# Patient Record
Sex: Male | Born: 1970 | Race: White | Hispanic: Yes | Marital: Married | Smoking: Never smoker
Health system: Southern US, Community
[De-identification: ages and names within clinical notes are randomized; demographics above are authoritative.]

## PROBLEM LIST (undated history)

## (undated) DIAGNOSIS — E119 Type 2 diabetes mellitus without complications: Secondary | ICD-10-CM

## (undated) DIAGNOSIS — I10 Essential (primary) hypertension: Secondary | ICD-10-CM

## (undated) HISTORY — PX: APPENDECTOMY: SHX54

---

## 2008-01-25 ENCOUNTER — Ambulatory Visit: Payer: Self-pay | Admitting: Nurse Practitioner

## 2008-01-25 DIAGNOSIS — K5900 Constipation, unspecified: Secondary | ICD-10-CM | POA: Insufficient documentation

## 2008-01-25 DIAGNOSIS — K219 Gastro-esophageal reflux disease without esophagitis: Secondary | ICD-10-CM | POA: Insufficient documentation

## 2008-01-29 ENCOUNTER — Ambulatory Visit: Payer: Self-pay | Admitting: *Deleted

## 2008-03-07 ENCOUNTER — Ambulatory Visit: Payer: Self-pay | Admitting: Nurse Practitioner

## 2008-03-07 DIAGNOSIS — E119 Type 2 diabetes mellitus without complications: Secondary | ICD-10-CM | POA: Insufficient documentation

## 2008-03-07 DIAGNOSIS — R81 Glycosuria: Secondary | ICD-10-CM | POA: Insufficient documentation

## 2008-03-07 DIAGNOSIS — E1165 Type 2 diabetes mellitus with hyperglycemia: Secondary | ICD-10-CM | POA: Insufficient documentation

## 2008-03-07 DIAGNOSIS — R3129 Other microscopic hematuria: Secondary | ICD-10-CM

## 2008-03-08 ENCOUNTER — Encounter (INDEPENDENT_AMBULATORY_CARE_PROVIDER_SITE_OTHER): Payer: Self-pay | Admitting: Nurse Practitioner

## 2008-03-09 ENCOUNTER — Encounter (INDEPENDENT_AMBULATORY_CARE_PROVIDER_SITE_OTHER): Payer: Self-pay | Admitting: Nurse Practitioner

## 2008-03-09 LAB — CONVERTED CEMR LAB
HDL: 41 mg/dL
LDL Cholesterol: 117 mg/dL
Total CHOL/HDL Ratio: 5
Triglycerides: 238 mg/dL
VLDL: 48 mg/dL

## 2008-03-11 ENCOUNTER — Encounter (INDEPENDENT_AMBULATORY_CARE_PROVIDER_SITE_OTHER): Payer: Self-pay | Admitting: Nurse Practitioner

## 2008-03-12 ENCOUNTER — Ambulatory Visit: Payer: Self-pay | Admitting: Nurse Practitioner

## 2008-07-05 ENCOUNTER — Ambulatory Visit: Payer: Self-pay | Admitting: Nurse Practitioner

## 2008-07-05 LAB — CONVERTED CEMR LAB
Bilirubin Urine: NEGATIVE
Ketones, urine, test strip: NEGATIVE
Microalb, Ur: 0.83 mg/dL (ref 0.00–1.89)
Nitrite: NEGATIVE
Urobilinogen, UA: 0.2

## 2008-07-11 ENCOUNTER — Ambulatory Visit: Payer: Self-pay | Admitting: Internal Medicine

## 2008-08-22 ENCOUNTER — Ambulatory Visit: Payer: Self-pay | Admitting: Nurse Practitioner

## 2008-08-22 DIAGNOSIS — B354 Tinea corporis: Secondary | ICD-10-CM | POA: Insufficient documentation

## 2008-08-22 LAB — CONVERTED CEMR LAB: Blood Glucose, Fingerstick: 152

## 2009-03-24 ENCOUNTER — Encounter: Admission: RE | Admit: 2009-03-24 | Discharge: 2009-03-24 | Payer: Self-pay | Admitting: Geriatric Medicine

## 2010-06-14 LAB — CONVERTED CEMR LAB
ALT: 69 units/L — ABNORMAL HIGH (ref 0–53)
AST: 29 units/L (ref 0–37)
Albumin: 4.5 g/dL (ref 3.5–5.2)
Alkaline Phosphatase: 83 units/L (ref 39–117)
BUN: 11 mg/dL (ref 6–23)
Basophils Absolute: 0.1 10*3/uL (ref 0.0–0.1)
Basophils Relative: 1 % (ref 0–1)
Blood Glucose, Fingerstick: 213
Chlamydia, Swab/Urine, PCR: NEGATIVE
Chloride: 103 meq/L (ref 96–112)
Eosinophils Absolute: 0.1 10*3/uL (ref 0.0–0.7)
Glucose, Urine, Semiquant: 250
HDL: 41 mg/dL (ref >39)
LDL Cholesterol: 117 mg/dL — ABNORMAL HIGH (ref 0–99)
MCHC: 32.3 g/dL (ref 30.0–36.0)
MCV: 87.5 fL (ref 78.0–100.0)
Monocytes Relative: 6 % (ref 3–12)
Neutro Abs: 4.3 10*3/uL (ref 1.7–7.7)
Neutrophils Relative %: 55 % (ref 43–77)
Nitrite: NEGATIVE
Platelets: 248 10*3/uL (ref 150–400)
Potassium: 4.4 meq/L (ref 3.5–5.3)
RBC: 6.16 M/uL — ABNORMAL HIGH (ref 4.22–5.81)
RDW: 14.2 % (ref 11.5–15.5)
TSH: 4.462 microintl units/mL (ref 0.350–4.50)
Total CHOL/HDL Ratio: 5
WBC Urine, dipstick: NEGATIVE

## 2015-05-13 ENCOUNTER — Encounter (HOSPITAL_COMMUNITY): Payer: Self-pay | Admitting: Emergency Medicine

## 2015-05-13 ENCOUNTER — Emergency Department (INDEPENDENT_AMBULATORY_CARE_PROVIDER_SITE_OTHER)
Admission: EM | Admit: 2015-05-13 | Discharge: 2015-05-13 | Disposition: A | Discharge status: Home / Self Care | Payer: BLUE CROSS/BLUE SHIELD | Source: Home / Self Care | Attending: Emergency Medicine | Admitting: Emergency Medicine

## 2015-05-13 DIAGNOSIS — S86811A Strain of other muscle(s) and tendon(s) at lower leg level, right leg, initial encounter: Secondary | ICD-10-CM

## 2015-05-13 DIAGNOSIS — M76891 Other specified enthesopathies of right lower limb, excluding foot: Secondary | ICD-10-CM

## 2015-05-13 DIAGNOSIS — S86911A Strain of unspecified muscle(s) and tendon(s) at lower leg level, right leg, initial encounter: Secondary | ICD-10-CM

## 2015-05-13 DIAGNOSIS — M6580 Other synovitis and tenosynovitis, unspecified site: Secondary | ICD-10-CM

## 2015-05-13 DIAGNOSIS — K6289 Other specified diseases of anus and rectum: Secondary | ICD-10-CM | POA: Diagnosis not present

## 2015-05-13 MED ORDER — DICLOFENAC POTASSIUM 50 MG PO TABS: 50.0000 mg | ORAL_TABLET | Freq: Three times a day (TID) | ORAL | Status: DC

## 2015-05-13 NOTE — ED Provider Notes (Signed)
CSN: [id number]     Arrival date & time 05/13/15  1320 History   First MD Initiated Contact with Patient 05/13/15 1428     Chief Complaint  Patient presents with  . Knee Pain  . Rectal Pain   (Consider location/radiation/quality/duration/timing/severity/associated sxs/prior Treatment) HPI Comments: 44 year old male presents the urgent care with 2 chronic complaints. First complaint is that of anal pain for a year. He believes he has hemorrhoids. It tends to, and go. He may be without discomfort for several weeks and then for a period time he will have discomfort. Primarily occurs when lifting. He states his job requires much walking, squatting and lifting. It is primarily at the time of lifting that he experiences. Anal pain.  The second complaint is pain in the right knee for 2 months. It is mostly constant but can occur acutely. It occurs mostly at work for the similar reasons as above, lifting, frequent ambulation, changing directions quickly and other physical maneuvers. The pain is across the anterior knee vertically along the medial and lateral aspects as well as the proximal calf tendon. It is also worse with prolonged standing. He denies any known single event injury. No known trauma. Rest seems to make it better. Physical activities at work seem to make it worse. He also feels a popping in the knee with flexion and extension.   History reviewed. No pertinent past medical history. History reviewed. No pertinent past surgical history. No family history on file. Social History  Substance Use Topics  . Smoking status: Never Smoker   . Smokeless tobacco: None  . Alcohol Use: Yes    Review of Systems  Constitutional: Positive for activity change. Negative for fever and fatigue.  HENT: Negative.   Respiratory: Negative.   Cardiovascular: Negative for chest pain and leg swelling.  Gastrointestinal: Negative.   Genitourinary: Negative.   Musculoskeletal: Positive for arthralgias.  Negative for back pain, joint swelling, neck pain and neck stiffness.  Skin: Negative.   Neurological: Negative.   Psychiatric/Behavioral: Negative.   All other systems reviewed and are negative.   Allergies  Review of patient's allergies indicates no known allergies.  Home Medications   Prior to Admission medications   Medication Sig Start Date End Date Taking? Authorizing Provider  diclofenac (CATAFLAM) 50 MG tablet Take 1 tablet (50 mg total) by mouth 3 (three) times daily. One tablet TID with food prn pain. 05/13/15   Hayden Rasmussenavid Shamari Lofquist, NP   Meds Ordered and Administered this Visit  Medications - No data to display  BP 124/80 mmHg  Pulse 79  Temp(Src) 98.1 F (36.7 C) (Oral)  Resp 16  SpO2 97% No data found.   Physical Exam  Constitutional: He is oriented to person, place, and time. He appears well-developed and well-nourished. No distress.  Eyes: EOM are normal.  Neck: Normal range of motion. Neck supple.  Cardiovascular: Normal rate.   Pulmonary/Chest: Effort normal. No respiratory distress.  Genitourinary:  Inspection of the anus and. He anal area reveal no lesions, redness, swelling or tenderness. DRE reveals normal sphincter tone, no palpable lesions. And no tenderness. Patient denies pain with DRE. There are no surrounding areas resembling infection or abscess formation.   Musculoskeletal: He exhibits no edema.  Specimen of the right knee reveals no swelling or deformity. With extension and flexion there is a popping sound. Extension to 180. Flexion to 110. There is tenderness along the anterior knee, patellofemoral ligament and patellar tibial ligament. There is tenderness across the medial and lateral  joint space vertically. No direct tenderness to the joint spaces. No direct tenderness along the joint line. There is tenderness along the medial aspect of the knee as well as the proximal calcaneus tendon. No laxity appreciated. Negative drawer. Negative varus, negative  valgus. Internal and external rotation reveals no pain. Distal neurovascular motor sensory is intact.   Neurological: He is alert and oriented to person, place, and time. He exhibits normal muscle tone.  Skin: Skin is warm and dry.  Psychiatric: He has a normal mood and affect.  Nursing note and vitals reviewed.   ED Course  Procedures (including critical care time)  Labs Review Labs Reviewed - No data to display  Imaging Review No results found.   Visual Acuity Review  Right Eye Distance:   Left Eye Distance:   Bilateral Distance:    Right Eye Near:   Left Eye Near:    Bilateral Near:         MDM   1. Perianal pain   2. Knee strain, right, initial encounter   3. Tendinitis of right knee    Knee sleeve to the right knee. Use ice before and after work. Limit activity that exacerbates the pain. Cataflam for pain and inflammation Call and obtain a PCP. May need referral for orthopedics in the future. Rectal exam unremarkable. Follow-up with PCP. No straining.   Hayden Rasmussen, NP 05/13/15 1524

## 2015-05-13 NOTE — ED Notes (Signed)
Patient reports right knee pain, no known injury.  Notices pain with a lot of standing.  Pain for 2 months. Patient reports rectal issues, thinks it is hemorrhoids, says he is not hurting right now.  Patient smells of stool

## 2015-05-13 NOTE — Discharge Instructions (Signed)
La[place] People's Democratic RepublicVenda elstica y RHCE Investment banker, operational(Elastic Bandage and RICE) QU FUNCIN CUMPLE LA VENDA ELSTICA? Las vendas elsticas vienen de diferentes formas y Veterinary surgeontamaos. Generalmente, sirven para sujetar la lesin y reducen la hinchazn mientras usted se recupera, pero pueden cumplir diferentes funciones. El mdico lo ayudar a decidir lo que es ms adecuado para su proteccin, recuperacin o rehabilitacin, despus de una lesin. CULES SON ALGUNOS CONSEJOS GENERALES PARA EL USO DE UNA VENDA ELSTICA?  Pngase la venda como lo indica el fabricante de la venda que est [place]usando.  No la ajuste demasiado, ya que esto puede interrumpir la circulacin en la zona del brazo o de la pierna por debajo de la venda.  Si una parte del cuerpo ms all de la venda se torna color azul, se adormece, se enfra, se hincha o le causa ms dolor, es probable que la venda est [place] Islandsmuy ajustada. Si eso ocurre, retire la venda y vuelva a colocarla ms floja.  Consulte al mdico si la venda parece estar agravando los problemas en lugar de mejorndolos.  La venda elstica debe quitarse y volver a ponerse cada 3 o 4horas, o como se lo haya indicado el mdico. QU SIGNIFICA LA SIGLA RHCE? Los cuidados de rutina de muchas lesiones incluyen reposo, hielo, compresin y elevacin (RHCE).  Reposo El reposo es necesario para permitir que el cuerpo se recupere. Generalmente, puede reanudar sus actividades habituales cuando se siente cmodo y el mdico se lo ha autorizado. Hielo Aplicar hielo en una lesin sirve para evitar la hinchazn y Engineer, materialsdisminuye el dolor. No aplique el hielo directamente sobre la piel.  Ponga el hielo en una bolsa plstica.  Coloque una toalla entre la piel y la bolsa de hielo.  Coloque el hielo durante 20minutos, 2 a 3veces por Futures traderda. Hgalo durante el tiempo que el mdico se lo haya indicado. Compresin La compresin ayuda a evitar la hinchazn, brinda sujecin y [place] Grenadinesayuda con las [address] Avemolestias. Una venda elstica sirve para la  compresin. Elevacin La elevacin ayuda a reducir la hinchazn y Engineer, materialsdisminuye el dolor. Si es posible, la zona lesionada debe estar a nivel del corazn o del centro del pecho, o por encima de [place]e. CUNDO DEBO BUSCAR ATENCIN MDICA? Debe buscar atencin mdica en las siguientes situaciones:  El dolor o la hinchazn persisten.  Los sntomas empeoran en vez de Scientist, clinical (histocompatibility and immunogenetics)mejorar. Estos sntomas pueden indicar que es necesaria una evaluacin ms profunda o nuevas radiografas. En algunos casos, las radiografas no muestran si hay un hueso pequeo roto (fractura) hasta varios das ms tarde. Concurra a las citas de control con el mdico. Consulte con su mdico la fecha en que los [place]resultados de las radiografas estarn disponibles. Asegrese de Starbucks Corporationobtener los resultados de las radiografas. CUNDO DEBO BUSCAR ASISTENCIA MDICA INMEDIATA? Solicite atencin mdica inmediatamente en las siguientes situaciones:  Comienza a Clinical cytogeneticistsentir sbitamente un dolor intenso en la zona de la lesin o por debajo de esta.  Aparece enrojecimiento o aumenta la hinchazn alrededor de la lesin.  Tiene hormigueo o adormecimiento en la zona de la lesin o por debajo de esta que no mejoran despus de quitarse la venda Adult nurseelstica.   Esta informacin no tiene Theme park managercomo fin reemplazar el consejo del mdico. Asegrese de hacerle al mdico cualquier pregunta que tenga.   Document Released: 02/10/2005 Document Revised: 05/24/2014 Elsevier Interactive Patient Education [employer]2016 Elsevier Inc.

## 2020-07-30 ENCOUNTER — Emergency Department (HOSPITAL_COMMUNITY)
Admission: EM | Admit: 2020-07-30 | Discharge: 2020-07-30 | Disposition: A | Discharge status: Home / Self Care | Payer: BLUE CROSS/BLUE SHIELD | Attending: Emergency Medicine | Admitting: Emergency Medicine

## 2020-07-30 ENCOUNTER — Emergency Department (HOSPITAL_COMMUNITY): Payer: BLUE CROSS/BLUE SHIELD

## 2020-07-30 ENCOUNTER — Encounter (HOSPITAL_COMMUNITY): Payer: Self-pay | Admitting: Emergency Medicine

## 2020-07-30 DIAGNOSIS — M79632 Pain in left forearm: Secondary | ICD-10-CM | POA: Insufficient documentation

## 2020-07-30 DIAGNOSIS — S199XXA Unspecified injury of neck, initial encounter: Secondary | ICD-10-CM | POA: Diagnosis present

## 2020-07-30 DIAGNOSIS — R202 Paresthesia of skin: Secondary | ICD-10-CM | POA: Insufficient documentation

## 2020-07-30 DIAGNOSIS — I10 Essential (primary) hypertension: Secondary | ICD-10-CM | POA: Insufficient documentation

## 2020-07-30 DIAGNOSIS — Y92410 Unspecified street and highway as the place of occurrence of the external cause: Secondary | ICD-10-CM | POA: Diagnosis not present

## 2020-07-30 DIAGNOSIS — E119 Type 2 diabetes mellitus without complications: Secondary | ICD-10-CM | POA: Diagnosis not present

## 2020-07-30 DIAGNOSIS — M546 Pain in thoracic spine: Secondary | ICD-10-CM | POA: Diagnosis not present

## 2020-07-30 HISTORY — DX: Type 2 diabetes mellitus without complications: E11.9

## 2020-07-30 HISTORY — DX: Essential (primary) hypertension: I10

## 2020-07-30 MED ORDER — CYCLOBENZAPRINE HCL 5 MG PO TABS: 5.0000 mg | ORAL_TABLET | Freq: Three times a day (TID) | ORAL | 0 refills | Status: DC | PRN

## 2020-07-30 MED ORDER — IBUPROFEN 200 MG PO TABS
400.0000 mg | ORAL_TABLET | Freq: Once | ORAL | Status: AC
  Administered 2020-07-30: 400 mg via ORAL
  Filled 2020-07-30: qty 2

## 2020-07-30 NOTE — Discharge Instructions (Signed)
Benjamin Thompson, it was a pleasure taking care of you. Your CT scan and MRI showed mild disc protrusion and some flattening of the spinal cord at the C5-C6 level. There were no fractures or other acute issues. I am discharging you with some medication for muscle spasm which should help your pain. The neurosurgeon's office will call you to schedule a follow up appointment.  Please return and seek care if you experiencing worsening pain, numbness, or new weakness.   _______________________  Sr. Arelia Sneddon, fue un Photographer. Su tomografa computarizada y resonancia magntica mostraron una leve protuberancia del disco y algo de aplanamiento de la mdula espinal en el nivel C5-C6. No hubo fracturas u otros problemas agudos. Le estoy dando de alta con un medicamento para el espasmo muscular que debera Teacher, early years/pre. El consultorio del neurocirujano lo llamar para programar una cita de seguimiento.  Regrese y busque atencin si experimenta un empeoramiento del dolor, entumecimiento o nueva debilidad.
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
## 2021-10-13 ENCOUNTER — Emergency Department (HOSPITAL_COMMUNITY)
Admission: EM | Admit: 2021-10-13 | Discharge: 2021-10-13 | Disposition: A | Discharge status: Home / Self Care | Payer: BLUE CROSS/BLUE SHIELD | Attending: Emergency Medicine | Admitting: Emergency Medicine

## 2021-10-13 ENCOUNTER — Encounter (HOSPITAL_COMMUNITY): Payer: Self-pay

## 2021-10-13 ENCOUNTER — Emergency Department (HOSPITAL_COMMUNITY): Payer: BLUE CROSS/BLUE SHIELD

## 2021-10-13 DIAGNOSIS — M546 Pain in thoracic spine: Secondary | ICD-10-CM | POA: Insufficient documentation

## 2021-10-13 DIAGNOSIS — Z20822 Contact with and (suspected) exposure to covid-19: Secondary | ICD-10-CM | POA: Insufficient documentation

## 2021-10-13 DIAGNOSIS — E860 Dehydration: Secondary | ICD-10-CM | POA: Insufficient documentation

## 2021-10-13 DIAGNOSIS — R55 Syncope and collapse: Secondary | ICD-10-CM | POA: Insufficient documentation

## 2021-10-13 DIAGNOSIS — E1165 Type 2 diabetes mellitus with hyperglycemia: Secondary | ICD-10-CM | POA: Insufficient documentation

## 2021-10-13 DIAGNOSIS — R509 Fever, unspecified: Secondary | ICD-10-CM

## 2021-10-13 DIAGNOSIS — R112 Nausea with vomiting, unspecified: Secondary | ICD-10-CM

## 2021-10-13 DIAGNOSIS — R000 Tachycardia, unspecified: Secondary | ICD-10-CM | POA: Insufficient documentation

## 2021-10-13 DIAGNOSIS — I10 Essential (primary) hypertension: Secondary | ICD-10-CM | POA: Insufficient documentation

## 2021-10-13 LAB — URINALYSIS, ROUTINE W REFLEX MICROSCOPIC
Bacteria, UA: NONE SEEN
Bilirubin Urine: NEGATIVE
Glucose, UA: >=500 mg/dL — AB
Hgb urine dipstick: NEGATIVE
Ketones, ur: NEGATIVE mg/dL
Leukocytes,Ua: NEGATIVE
Nitrite: NEGATIVE
Protein, ur: NEGATIVE mg/dL
Specific Gravity, Urine: 1.016 (ref 1.005–1.030)
pH: 5 (ref 5.0–8.0)

## 2021-10-13 LAB — OSMOLALITY: Osmolality: 296 mOsm/kg — ABNORMAL HIGH (ref 275–295)

## 2021-10-13 LAB — PROTIME-INR
INR: 1 (ref 0.8–1.2)
Prothrombin Time: 13 seconds (ref 11.4–15.2)

## 2021-10-13 LAB — MAGNESIUM: Magnesium: 1.8 mg/dL (ref 1.7–2.4)

## 2021-10-13 LAB — LACTIC ACID, PLASMA
Lactic Acid, Venous: 3.3 mmol/L (ref 0.5–1.9)
Lactic Acid, Venous: 1.7 mmol/L (ref 0.5–1.9)

## 2021-10-13 LAB — SARS CORONAVIRUS 2 BY RT PCR: SARS Coronavirus 2 by RT PCR: NEGATIVE

## 2021-10-13 LAB — TROPONIN I (HIGH SENSITIVITY)
Troponin I (High Sensitivity): 7 ng/L (ref <18)
Troponin I (High Sensitivity): 7 ng/L (ref <18)

## 2021-10-13 LAB — BETA-HYDROXYBUTYRIC ACID: Beta-Hydroxybutyric Acid: 0.15 mmol/L (ref 0.05–0.27)

## 2021-10-13 LAB — CBG MONITORING, ED: Glucose-Capillary: 356 mg/dL — ABNORMAL HIGH (ref 70–99)

## 2021-10-13 MED ORDER — ACETAMINOPHEN 500 MG PO TABS
1000.0000 mg | ORAL_TABLET | Freq: Once | ORAL | Status: AC
  Administered 2021-10-13: 1000 mg via ORAL
  Filled 2021-10-13: qty 2

## 2021-10-13 MED ORDER — LACTATED RINGERS IV BOLUS (SEPSIS)
2000.0000 mL | Freq: Once | INTRAVENOUS | Status: AC
  Administered 2021-10-13: 2000 mL via INTRAVENOUS

## 2021-10-13 NOTE — Discharge Instructions (Signed)
You are seen in the emergency department for fever and dizziness nausea vomiting.  You had a temperature of 103 degrees.  Your symptoms improved with some fluids and Tylenol.  Please continue to stay well-hydrated at home.  Return to the emergency department if any worsening or concerning symptoms.

## 2021-10-13 NOTE — ED Triage Notes (Signed)
Near syncope, dizziness, back pain and chills while at work. Pt reports he passed out last week but did not go to the doctor. EMS also reports nausea, vomiting. 4mg  IV Zofran and 400cc NS bolus given.

## 2021-10-13 NOTE — ED Provider Notes (Signed)
Signout from Dr. Doren Custard.  51 year old male here with acute onset of chills fatigue nausea vomiting dizziness.  Here had a fever to 103 and elevated lactate.  Getting cultures and IV fluids.  Plan is to follow-up on repeat lactate and if feeling better possible viral syndrome and can be discharged. Physical Exam  BP 109/72   Pulse (!) 109   Temp 100.3 F (37.9 C) (Oral)   Resp 20   SpO2 94%   Physical Exam  Procedures  Procedures  ED Course / MDM    Medical Decision Making Amount and/or Complexity of Data Reviewed Labs: ordered. Radiology: ordered.  Risk OTC drugs.   Patient states he feels much better after fluids.  His lactate is cleared.  Heart rate down to just about 100.  He is comfortable plan for discharge.  Recommended monitoring his symptoms at home Tylenol and ibuprofen and drink plenty of fluids.  Return to the emergency department if any worsening or concerning symptoms.  Return instructions discussed.  All this information was provided by Spanish interpreter iPad       Hayden Rasmussen, MD 10/14/21 1038

## 2021-10-13 NOTE — ED Notes (Signed)
Lactic 3.3. MD made aware.

## 2021-10-13 NOTE — ED Provider Notes (Signed)
[facility] EMERGENCY DEPARTMENT Provider Note   CSN: [id number] Arrival date & time: 10/13/21  1359     History  Chief Complaint  Patient presents with   Near Syncope    Benjamin Thompson is a 51 y.o. male.  The history is provided by the patient. The history is limited by a language barrier. A language interpreter was used.  Patient presents for near syncope.  His medical history includes DM, GERD, and HTN.  He reports that he was in his normal state of health up until yesterday.  Yesterday, he began to experience a worsening of his chronic mid back pain.  Today he went to work.  Patient works in [employer].  While at work, he developed symptoms of chills, fatigue, nausea, vomiting, and dizziness.  He did vomit several times.  EMS was called.  EMS provided 4 mg of Zofran and 400 cc of IVF prior to arrival.  Patient currently states that his nausea has resolved.  He denies any chest pain at any point.  He has not had a recent cough.  He denies any abdominal pain.  He was not aware of any fevers prior to today.  Blood sugar prior to arrival was 379.  Patient states that he currently takes metformin for his diabetes.  He does not take any other diabetic medications.    Home Medications Prior to Admission medications   Medication Sig Start Date End Date Taking? Authorizing Provider  cyclobenzaprine (FLEXERIL) 5 MG tablet Take 1 tablet (5 mg total) by mouth 3 (three) times daily as needed for muscle spasms. 07/30/20   Remo Lipps, MD  diclofenac (CATAFLAM) 50 MG tablet Take 1 tablet (50 mg total) by mouth 3 (three) times daily. One tablet TID with food prn pain. 05/13/15   Hayden Rasmussen, NP      Allergies    Patient has no known allergies.    Review of Systems   Review of Systems  Constitutional:  Positive for chills and fatigue.  Gastrointestinal:  Positive for nausea and vomiting.  Neurological:  Positive for dizziness and light-headedness.  All other systems  reviewed and are negative.  Physical Exam Updated Vital Signs BP (!) 144/80   Pulse (!) 115   Temp (!) 103 F (39.4 C) (Oral)   Resp (!) 23   SpO2 96%  Physical Exam Vitals and nursing note reviewed.  Constitutional:      General: He is not in acute distress.    Appearance: Normal appearance. He is well-developed and normal weight. He is ill-appearing. He is not toxic-appearing or diaphoretic.  HENT:     Head: Normocephalic and atraumatic.     Right Ear: External ear normal.     Left Ear: External ear normal.     Nose: Nose normal.     Mouth/Throat:     Mouth: Mucous membranes are moist.     Pharynx: Oropharynx is clear.  Eyes:     Extraocular Movements: Extraocular movements intact.     Conjunctiva/sclera: Conjunctivae normal.  Cardiovascular:     Rate and Rhythm: Regular rhythm. Tachycardia present.     Heart sounds: No murmur heard. Pulmonary:     Effort: Pulmonary effort is normal. No respiratory distress.     Breath sounds: Normal breath sounds. No wheezing or rales.  Chest:     Chest wall: No tenderness.  Abdominal:     Palpations: Abdomen is soft.     Tenderness: There is no abdominal tenderness.  Musculoskeletal:        General: Tenderness (T-spine, patient states this is chronic) present. No swelling. Normal range of motion.     Cervical back: Normal range of motion and neck supple. No rigidity.     Right lower leg: No edema.     Left lower leg: No edema.  Skin:    General: Skin is warm and dry.     Capillary Refill: Capillary refill takes less than 2 seconds.     Coloration: Skin is not jaundiced or pale.     Findings: No erythema.  Neurological:     General: No focal deficit present.     Mental Status: He is alert and oriented to person, place, and time.     Cranial Nerves: No cranial nerve deficit.     Sensory: No sensory deficit.     Motor: No weakness.     Coordination: Coordination normal.  Psychiatric:        Mood and Affect: Mood normal.         Behavior: Behavior normal.        Thought Content: Thought content normal.        Judgment: Judgment normal.    ED Results / Procedures / Treatments   Labs (all labs ordered are listed, but only abnormal results are displayed) Labs Reviewed  LACTIC ACID, PLASMA - Abnormal; Notable for the following components:      Result Value   Lactic Acid, Venous 3.3 (*)    All other components within normal limits  OSMOLALITY - Abnormal; Notable for the following components:   Osmolality 296 (*)    All other components within normal limits  CBG MONITORING, ED - Abnormal; Notable for the following components:   Glucose-Capillary 356 (*)    All other components within normal limits  CULTURE, BLOOD (ROUTINE X 2)  CULTURE, BLOOD (ROUTINE X 2)  URINE CULTURE  SARS CORONAVIRUS 2 BY RT PCR  PROTIME-INR  BETA-HYDROXYBUTYRIC ACID  LACTIC ACID, PLASMA  CBC WITH DIFFERENTIAL/PLATELET  URINALYSIS, ROUTINE W REFLEX MICROSCOPIC  MAGNESIUM  INFLUENZA PANEL BY PCR (TYPE A & B)  TROPONIN I (HIGH SENSITIVITY)    EKG EKG Interpretation  Date/Time:  Tuesday Oct 13 2021 14:06:35 EDT Ventricular Rate:  119 PR Interval:  143 QRS Duration: 85 QT Interval:  299 QTC Calculation: 421 R Axis:   266 Text Interpretation: Sinus tachycardia Left anterior fascicular block Abnormal R-wave progression, late transition Borderline ST elevation, anterior leads Confirmed by Gloris Manchester [phone]) on 10/13/2021 2:20:11 PM  Radiology DG Chest Port 1 View  Result Date: 10/13/2021 CLINICAL DATA:  Sepsis, syncope. EXAM: PORTABLE CHEST 1 VIEW COMPARISON:  None Available. FINDINGS: The heart size and mediastinal contours are within normal limits. Right lung is clear. Minimal left basilar subsegmental atelectasis or infiltrate is noted. The visualized skeletal structures are unremarkable. IMPRESSION: Minimal left basilar subsegmental atelectasis or infiltrate is noted. Electronically Signed   By: Lupita Raider M.D.   On: 10/13/2021  15:04    Procedures Procedures    Medications Ordered in ED Medications  lactated ringers bolus 2,000 mL (2,000 mLs Intravenous New Bag/Given 10/13/21 1434)  acetaminophen (TYLENOL) tablet 1,000 mg (1,000 mg Oral Given 10/13/21 1433)    ED Course/ Medical Decision Making/ A&P                           Medical Decision Making Amount and/or Complexity of Data Reviewed Labs: ordered. Radiology: ordered.  Risk OTC drugs.   This patient presents to the ED for concern of near syncope, this involves an extensive number of treatment options, and is a complaint that carries with it a high risk of complications and morbidity.  The differential diagnosis includes dehydration, infection, arrhythmia, PE, ACS, vasovagal etiology   Co morbidities that complicate the patient evaluation  DM, HTN, GERD   Additional history obtained:  Additional history obtained from EMS External records from outside source obtained and reviewed including EMR   Lab Tests:  I Ordered, and personally interpreted labs.  The pertinent results include: Initial blood glucose 356.  (Remaining lab work pending at time of signout)   Imaging Studies ordered:  I ordered imaging studies including chest x-ray I independently visualized and interpreted imaging which showed minimal left basilar opacification I agree with the radiologist interpretation   Cardiac Monitoring: / EKG:  The patient was maintained on a cardiac monitor.  I personally viewed and interpreted the cardiac monitored which showed an underlying rhythm of: Sinus rhythm  Problem List / ED Course / Critical interventions / Medication management  Patient is a [age] year old male presenting for near syncopal symptoms.  This occurred at work earlier today.  He reports that he was in his normal state of health up until yesterday.  Yesterday began to experience aches, including worsening of his chronic mid back pain.  Prior to arrival today, patient was  at work when he experienced nausea and vomiting.  This was followed by lightheadedness and dizziness.  4 mg of Zofran and 400 cc of IVF were given prior to arrival.  On arrival, patient is ill-appearing.  He is found to have oral temperature 103 degrees.  Vital signs are otherwise notable for tachycardia and tachypnea.  Patient denies any recent cough.  His lungs are clear to auscultation.  His abdomen is soft and nontender.  There are no skin findings to suggest cellulitis or Fournier's gangrene.  Patient was given Tylenol for antipyresis and IV fluids for his tachycardia.  Work was initiated to identify source of infection and/or complications from his hyperglycemia.  At time of signout, results of work-up were pending.  Care of patient was signed out to oncoming ED provider. I ordered medication including IV fluids for dehydration; Tylenol for antipyresis. Reevaluation of the patient after these medicines showed that the patient improved I have reviewed the patients home medicines and have made adjustments as needed   Social Determinants of Health:  Lives independently         Final Clinical Impression(s) / ED Diagnoses Final diagnoses:  Near syncope  Dehydration  Nausea and vomiting, unspecified vomiting type  Fever in adult    Rx / DC Orders ED Discharge Orders     None         Gloris Manchesterixon, Reyes Fifield, MD 10/13/21 1533

## 2021-10-14 ENCOUNTER — Inpatient Hospital Stay (HOSPITAL_COMMUNITY)
Admission: EM | Admit: 2021-10-14 | Discharge: 2021-10-20 | DRG: 871 | Disposition: A | Discharge status: Home / Self Care | Payer: Self-pay | Attending: Internal Medicine | Admitting: Internal Medicine

## 2021-10-14 ENCOUNTER — Telehealth (HOSPITAL_BASED_OUTPATIENT_CLINIC_OR_DEPARTMENT_OTHER): Payer: Self-pay

## 2021-10-14 ENCOUNTER — Telehealth (HOSPITAL_BASED_OUTPATIENT_CLINIC_OR_DEPARTMENT_OTHER): Payer: Self-pay | Admitting: Emergency Medicine

## 2021-10-14 ENCOUNTER — Other Ambulatory Visit: Payer: Self-pay

## 2021-10-14 DIAGNOSIS — A4159 Other Gram-negative sepsis: Principal | ICD-10-CM | POA: Diagnosis present

## 2021-10-14 DIAGNOSIS — Z8249 Family history of ischemic heart disease and other diseases of the circulatory system: Secondary | ICD-10-CM

## 2021-10-14 DIAGNOSIS — K219 Gastro-esophageal reflux disease without esophagitis: Secondary | ICD-10-CM | POA: Diagnosis present

## 2021-10-14 DIAGNOSIS — R7881 Bacteremia: Secondary | ICD-10-CM | POA: Diagnosis present

## 2021-10-14 DIAGNOSIS — B961 Klebsiella pneumoniae [K. pneumoniae] as the cause of diseases classified elsewhere: Secondary | ICD-10-CM | POA: Diagnosis present

## 2021-10-14 DIAGNOSIS — K760 Fatty (change of) liver, not elsewhere classified: Secondary | ICD-10-CM | POA: Diagnosis present

## 2021-10-14 DIAGNOSIS — I10 Essential (primary) hypertension: Secondary | ICD-10-CM | POA: Diagnosis present

## 2021-10-14 DIAGNOSIS — Z79899 Other long term (current) drug therapy: Secondary | ICD-10-CM

## 2021-10-14 DIAGNOSIS — J150 Pneumonia due to Klebsiella pneumoniae: Secondary | ICD-10-CM | POA: Diagnosis present

## 2021-10-14 DIAGNOSIS — E782 Mixed hyperlipidemia: Secondary | ICD-10-CM | POA: Diagnosis present

## 2021-10-14 DIAGNOSIS — E1165 Type 2 diabetes mellitus with hyperglycemia: Secondary | ICD-10-CM | POA: Diagnosis present

## 2021-10-14 DIAGNOSIS — Z833 Family history of diabetes mellitus: Secondary | ICD-10-CM

## 2021-10-14 DIAGNOSIS — K750 Abscess of liver: Secondary | ICD-10-CM | POA: Diagnosis present

## 2021-10-14 DIAGNOSIS — E872 Acidosis, unspecified: Secondary | ICD-10-CM | POA: Diagnosis present

## 2021-10-14 DIAGNOSIS — M25512 Pain in left shoulder: Secondary | ICD-10-CM | POA: Diagnosis present

## 2021-10-14 DIAGNOSIS — E119 Type 2 diabetes mellitus without complications: Secondary | ICD-10-CM | POA: Diagnosis present

## 2021-10-14 LAB — CBC WITH DIFFERENTIAL/PLATELET
Abs Immature Granulocytes: 0.05 10*3/uL (ref 0.00–0.07)
Basophils Absolute: 0 10*3/uL (ref 0.0–0.1)
Basophils Relative: 0 %
Eosinophils Absolute: 0 10*3/uL (ref 0.0–0.5)
Eosinophils Relative: 0 %
HCT: 49.1 % (ref 39.0–52.0)
Hemoglobin: 16.2 g/dL (ref 13.0–17.0)
Immature Granulocytes: 1 %
Lymphocytes Relative: 4 %
Lymphs Abs: 0.4 10*3/uL — ABNORMAL LOW (ref 0.7–4.0)
MCH: 28.9 pg (ref 26.0–34.0)
MCHC: 33 g/dL (ref 30.0–36.0)
MCV: 87.7 fL (ref 80.0–100.0)
Monocytes Absolute: 0.4 10*3/uL (ref 0.1–1.0)
Monocytes Relative: 4 %
Neutro Abs: 8.8 10*3/uL — ABNORMAL HIGH (ref 1.7–7.7)
Neutrophils Relative %: 91 %
Platelets: 151 10*3/uL (ref 150–400)
RBC: 5.6 MIL/uL (ref 4.22–5.81)
RDW: 13.2 % (ref 11.5–15.5)
WBC: 9.7 10*3/uL (ref 4.0–10.5)
nRBC: 0 % (ref 0.0–0.2)

## 2021-10-14 LAB — BLOOD CULTURE ID PANEL (REFLEXED) - BCID2

## 2021-10-14 LAB — URINE CULTURE: Culture: NO GROWTH

## 2021-10-14 LAB — BASIC METABOLIC PANEL
Anion gap: 10 (ref 5–15)
BUN: 9 mg/dL (ref 6–20)
CO2: 24 mmol/L (ref 22–32)
Calcium: 8.4 mg/dL — ABNORMAL LOW (ref 8.9–10.3)
Chloride: 98 mmol/L (ref 98–111)
Creatinine, Ser: 1.04 mg/dL (ref 0.61–1.24)
GFR, Estimated: >60 mL/min (ref >60)
Glucose, Bld: 359 mg/dL — ABNORMAL HIGH (ref 70–99)
Potassium: 4.4 mmol/L (ref 3.5–5.1)
Sodium: 132 mmol/L — ABNORMAL LOW (ref 135–145)

## 2021-10-14 LAB — LACTIC ACID, PLASMA
Lactic Acid, Venous: 2.5 mmol/L (ref 0.5–1.9)
Lactic Acid, Venous: 2.2 mmol/L (ref 0.5–1.9)

## 2021-10-14 MED ORDER — VANCOMYCIN HCL 1500 MG/300ML IV SOLN
1500.0000 mg | Freq: Once | INTRAVENOUS | Status: DC
  Filled 2021-10-14: qty 300

## 2021-10-14 MED ORDER — ACETAMINOPHEN 500 MG PO TABS
1000.0000 mg | ORAL_TABLET | Freq: Once | ORAL | Status: AC
  Administered 2021-10-14: 1000 mg via ORAL
  Filled 2021-10-14: qty 2

## 2021-10-14 MED ORDER — SODIUM CHLORIDE 0.9 % IV SOLN
2.0000 g | Freq: Three times a day (TID) | INTRAVENOUS | Status: DC
  Administered 2021-10-14: 2 g via INTRAVENOUS
  Filled 2021-10-14: qty 12.5

## 2021-10-14 MED ORDER — SODIUM CHLORIDE 0.9 % IV BOLUS
1000.0000 mL | Freq: Once | INTRAVENOUS | Status: AC
  Administered 2021-10-14: 1000 mL via INTRAVENOUS

## 2021-10-14 MED ORDER — CEFTRIAXONE SODIUM 2 G IJ SOLR
2.0000 g | INTRAMUSCULAR | Status: DC
  Administered 2021-10-15 – 2021-10-17 (×3): 2 g via INTRAVENOUS
  Filled 2021-10-14 (×3): qty 20

## 2021-10-14 MED ORDER — SODIUM CHLORIDE 0.9 % IV SOLN: INTRAVENOUS | Status: DC | PRN

## 2021-10-14 NOTE — ED Triage Notes (Signed)
Pt was told to come back to ER for positive blood cultures after being seen yesterday. Pt febrile in triage, denies any changes in symptoms.

## 2021-10-14 NOTE — Progress Notes (Signed)
Pharmacy Antibiotic Note  Benjamin Thompson is a 51 y.o. male for which pharmacy has been consulted for cefepime and vancomycin dosing for bacteremia.  Patient presented yesterday with acute onset of chills fatigue N/V and dizziness >> discharged but returned after being informed of positive blood cultures.  5/30 BCx with 2/2 GNRs 5/30 BCID w/ Enterobacterales / Klebsiella pneumoniae  Plan: D/w ED provider and will streamline therapy to rocephin 2g q24h Trend WBC, Fever, Renal function, & Clinical course F/u cultures, clinical course, WBC, fever De-escalate when able     Temp (24hrs), Avg:102.6 F (39.2 C), Min:102.6 F (39.2 C), Max:102.6 F (39.2 C)  Recent Labs  Lab 10/13/21 1423 10/13/21 1841 10/14/21 2007 10/14/21 2008  WBC  --   --  9.7  --   CREATININE  --   --  1.04  --   LATICACIDVEN 3.3* 1.7  --  2.5*    CrCl cannot be calculated (Unknown ideal weight.).    No Known Allergies  Antimicrobials this admission: Cefepime 5/31 x 1 Rocephin 5/31 >>  Microbiology results: Pending  Thank you for allowing pharmacy to be a part of this patient's care.  Delmar Landau, PharmD, BCPS 10/14/2021 10:34 PM ED Clinical Pharmacist -  [phone]

## 2021-10-14 NOTE — ED Provider Notes (Signed)
[facility] EMERGENCY DEPARTMENT Provider Note   CSN: [id number] Arrival date & time: 10/14/21  1946     History  Chief Complaint  Patient presents with   Abnormal Lab   Fever    Benjamin Thompson is a 51 y.o. male.  The history is provided by the patient and medical records.  Abnormal Lab Fever  51 year old male with history of diabetes, presenting to the ED with abnormal labs.  He was seen in the ED yesterday for fever and had extensive septic work-up performed, ultimately felt to be likely viral process.  States he was called today because his blood cultures were positive.  He continues to have fevers, denies any real change in his symptoms.  Yesterday he was having back pain but states that has since resolved. 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  Did not work today and is feeling better.  He denies any cough, urinary symptoms, atypical joint pain.  He denies any history of IV drug use, steroid injections, or other intravenous substance abuse.  He has never had positive blood cultures in the past.  Home Medications Prior to Admission medications   Medication Sig Start Date End Date Taking? Authorizing Provider  cyclobenzaprine (FLEXERIL) 5 MG tablet Take 1 tablet (5 mg total) by mouth 3 (three) times daily as needed for muscle spasms. 07/30/20   Remo Lipps, MD  diclofenac (CATAFLAM) 50 MG tablet Take 1 tablet (50 mg total) by mouth 3 (three) times daily. One tablet TID with food prn pain. 05/13/15   Hayden Rasmussen, NP      Allergies    Patient has no known allergies.    Review of Systems   Review of Systems  Constitutional:  Positive for fever.  All other systems reviewed and are negative.  Physical Exam Updated Vital Signs BP 131/75 (BP Location: Left Arm)   Pulse (!) 109   Temp (!) 102.6 F (39.2 C) (Oral)   Resp 17   SpO2 96%   Physical Exam Vitals and nursing note  reviewed.  Constitutional:      Appearance: He is well-developed.     Comments: Clinically well appearing, warm to touch  HENT:     Head: Normocephalic and atraumatic.  Eyes:     Conjunctiva/sclera: Conjunctivae normal.     Pupils: Pupils are equal, round, and reactive to light.  Cardiovascular:     Rate and Rhythm: Normal rate and regular rhythm.     Heart sounds: Normal heart sounds.  Pulmonary:     Effort: Pulmonary effort is normal. No respiratory distress.     Breath sounds: Normal breath sounds. No rhonchi.  Abdominal:     General: Bowel sounds are normal.     Palpations: Abdomen is soft.     Tenderness: There is no abdominal tenderness. There is no rebound.  Musculoskeletal:        General: Normal range of motion.     Cervical back: Normal range of motion.  Skin:    General: Skin is warm and dry.     Comments: No rashes or abnormal skin findings visualized  Neurological:     Mental Status: He is alert and oriented to person, place, and time.    ED Results / Procedures / Treatments   Labs (all labs ordered are listed, but only abnormal results are displayed) Labs Reviewed  BASIC METABOLIC PANEL - Abnormal; Notable for the  following components:      Result Value   Sodium 132 (*)    Glucose, Bld 359 (*)    Calcium 8.4 (*)    All other components within normal limits  LACTIC ACID, PLASMA - Abnormal; Notable for the following components:   Lactic Acid, Venous 2.5 (*)    All other components within normal limits  CBC WITH DIFFERENTIAL/PLATELET - Abnormal; Notable for the following components:   Neutro Abs 8.8 (*)    Lymphs Abs 0.4 (*)    All other components within normal limits  LACTIC ACID, PLASMA    EKG None  Radiology DG Chest Port 1 View  Result Date: 10/13/2021 CLINICAL DATA:  Sepsis, syncope. EXAM: PORTABLE CHEST 1 VIEW COMPARISON:  None Available. FINDINGS: The heart size and mediastinal contours are within normal limits. Right lung is clear. Minimal  left basilar subsegmental atelectasis or infiltrate is noted. The visualized skeletal structures are unremarkable. IMPRESSION: Minimal left basilar subsegmental atelectasis or infiltrate is noted. Electronically Signed   By: Lupita Raider M.D.   On: 10/13/2021 15:04    Procedures Procedures    CRITICAL CARE Performed by: Garlon Hatchet   Total critical care time: 35 minutes  Critical care time was exclusive of separately billable procedures and treating other patients.  Critical care was necessary to treat or prevent imminent or life-threatening deterioration.  Critical care was time spent personally by me on the following activities: development of treatment plan with patient and/or surrogate as well as nursing, discussions with consultants, evaluation of patient's response to treatment, examination of patient, obtaining history from patient or surrogate, ordering and performing treatments and interventions, ordering and review of laboratory studies, ordering and review of radiographic studies, pulse oximetry and re-evaluation of patient's condition.   Medications Ordered in ED Medications  vancomycin (VANCOREADY) IVPB 1500 mg/300 mL (has no administration in time range)  0.9 %  sodium chloride infusion ( Intravenous New Bag/Given 10/14/21 2257)  cefTRIAXone (ROCEPHIN) 2 g in sodium chloride 0.9 % 100 mL IVPB (has no administration in time range)  acetaminophen (TYLENOL) tablet 1,000 mg (1,000 mg Oral Given 10/14/21 2011)  sodium chloride 0.9 % bolus 1,000 mL (1,000 mLs Intravenous New Bag/Given 10/14/21 2254)    ED Course/ Medical Decision Making/ A&P                           Medical Decision Making Amount and/or Complexity of Data Reviewed Labs: ordered. Radiology: ordered.  Risk OTC drugs. Prescription drug management. Decision regarding hospitalization.   51 year old male presenting back to the ED today after extensive work-up yesterday due to positive blood culture  results.  Work-up yesterday with elevated lactic that cleared with fluids, CXR with questionable left basilar infiltrate vs atelectasis.  UA without signs of infection.  Covid screen was negative.  Patient denies any significant cough.  Did have some back pain yesterday but that has since resolved.  He denies any hx of IVDU, cancer, or other IV substance abuse.  Blood cultures grew out enterobacterales and klebsiella pneumonia.  Labs today again with elevated lactate at 2.5, however no leukocytosis.  He does have mild hyperglycemia without findings of DKA.  Initially started vanc/cefepime-- pharmacy recommends just to give 2g Rocephin and should provider adequate coverage.  Repeat set of cultures drawn.  Will admit for ongoing care.   Case discussed with hospitalist, Dr. Leafy Half-- we have discussed work-up thus far, will repeat UA, obtain CTA  to assess for PE or occult PNA.  He will admit for ongoing care.  CTA negative for acute PE, bilateral ground glass opacities-- questionable atypical infection.  Covid screen yesterday was negative.    Final Clinical Impression(s) / ED Diagnoses Final diagnoses:  Bacteremia    Rx / DC Orders ED Discharge Orders     None         Garlon HatchetSanders, Terra Aveni M, PA-C 10/15/21 0559    Ernie AvenaLawsing, James, MD 10/20/21 1112

## 2021-10-14 NOTE — Telephone Encounter (Signed)
Received call from micro lab with positive blood culture results. Consulted with Dr. Nicanor Alcon who advised pt to return to [facility] ED for further evaluation and possible admission. Will have day shift charge follow up with pt.

## 2021-10-14 NOTE — Telephone Encounter (Signed)
Attempted to call patient to inform of positive blood culture and to return to [facility] ED for treatment. No answer. Felicita Gage EMT with this RN, left voicemail in spanish to return phone call. Spanish speaker as noted in chart. Will attempt to call again at a later time.

## 2021-10-14 NOTE — Telephone Encounter (Signed)
Attempted to call patient again, no answer. Left another voicemail.

## 2021-10-14 NOTE — Telephone Encounter (Signed)
Denton Brick, NT assisted this RN with spanish interpretation over the phone. Spoke with patient and informed him to return to [facility] ED for re-evaluation after positive blood cultures. Patient understands and states will return tomorrow morning.

## 2021-10-14 NOTE — ED Provider Triage Note (Signed)
Emergency Medicine Provider Triage Evaluation Note  Kenyatta Keidel , a 51 y.o. male  was evaluated in triage.  Pt complains of fever and chills.  Patient was evaluated in the emergency department yesterday where a sepsis work-up was completed.  He was contacted today due to a positive result of one of his blood tests.  It was recommended he come back to the emergency department for treatment.  The patient denies any improvement in his symptoms.  He is febrile at this time.  Review of Systems  Positive: Fever, chills Negative: Shortness of breath  Physical Exam  BP (!) 142/83 (BP Location: Right Arm)   Pulse 98   Temp (!) 102.6 F (39.2 C) (Oral)   Resp 17   SpO2 96%  Gen:   Awake, no distress   Resp:  Normal effort  MSK:   Moves extremities without difficulty  Other:    Medical Decision Making  Medically screening exam initiated at 8:10 PM.  Appropriate orders placed.  Arlando Leisinger was informed that the remainder of the evaluation will be completed by another provider, this initial triage assessment does not replace that evaluation, and the importance of remaining in the ED until their evaluation is complete.     Pamala Duffel 10/14/21 2011

## 2021-10-15 ENCOUNTER — Encounter (HOSPITAL_COMMUNITY): Payer: Self-pay | Admitting: Internal Medicine

## 2021-10-15 ENCOUNTER — Observation Stay (HOSPITAL_COMMUNITY): Payer: Self-pay

## 2021-10-15 DIAGNOSIS — I10 Essential (primary) hypertension: Secondary | ICD-10-CM | POA: Diagnosis present

## 2021-10-15 DIAGNOSIS — R7881 Bacteremia: Secondary | ICD-10-CM

## 2021-10-15 DIAGNOSIS — E872 Acidosis, unspecified: Secondary | ICD-10-CM | POA: Diagnosis present

## 2021-10-15 DIAGNOSIS — E782 Mixed hyperlipidemia: Secondary | ICD-10-CM | POA: Diagnosis present

## 2021-10-15 DIAGNOSIS — E1165 Type 2 diabetes mellitus with hyperglycemia: Secondary | ICD-10-CM

## 2021-10-15 DIAGNOSIS — B961 Klebsiella pneumoniae [K. pneumoniae] as the cause of diseases classified elsewhere: Secondary | ICD-10-CM

## 2021-10-15 DIAGNOSIS — J150 Pneumonia due to Klebsiella pneumoniae: Secondary | ICD-10-CM | POA: Diagnosis present

## 2021-10-15 LAB — CBC WITH DIFFERENTIAL/PLATELET
Abs Immature Granulocytes: 0.04 10*3/uL (ref 0.00–0.07)
Basophils Absolute: 0 10*3/uL (ref 0.0–0.1)
Basophils Relative: 0 %
Eosinophils Absolute: 0 10*3/uL (ref 0.0–0.5)
Eosinophils Relative: 0 %
HCT: 44.1 % (ref 39.0–52.0)
Hemoglobin: 15.4 g/dL (ref 13.0–17.0)
Immature Granulocytes: 1 %
Lymphocytes Relative: 4 %
Lymphs Abs: 0.4 10*3/uL — ABNORMAL LOW (ref 0.7–4.0)
MCH: 30 pg (ref 26.0–34.0)
MCHC: 34.9 g/dL (ref 30.0–36.0)
MCV: 85.8 fL (ref 80.0–100.0)
Monocytes Absolute: 0.4 10*3/uL (ref 0.1–1.0)
Monocytes Relative: 4 %
Neutro Abs: 7.7 10*3/uL (ref 1.7–7.7)
Neutrophils Relative %: 91 %
Platelets: 108 10*3/uL — ABNORMAL LOW (ref 150–400)
RBC: 5.14 MIL/uL (ref 4.22–5.81)
RDW: 13.4 % (ref 11.5–15.5)
WBC: 8.5 10*3/uL (ref 4.0–10.5)
nRBC: 0 % (ref 0.0–0.2)

## 2021-10-15 LAB — URINALYSIS, ROUTINE W REFLEX MICROSCOPIC
Bacteria, UA: NONE SEEN
Bilirubin Urine: NEGATIVE
Glucose, UA: >=500 mg/dL — AB
Ketones, ur: 80 mg/dL — AB
Leukocytes,Ua: NEGATIVE
Nitrite: NEGATIVE
Protein, ur: NEGATIVE mg/dL
Specific Gravity, Urine: 1.016 (ref 1.005–1.030)
pH: 6 (ref 5.0–8.0)

## 2021-10-15 LAB — GLUCOSE, CAPILLARY
Glucose-Capillary: 216 mg/dL — ABNORMAL HIGH (ref 70–99)
Glucose-Capillary: 253 mg/dL — ABNORMAL HIGH (ref 70–99)

## 2021-10-15 LAB — COMPREHENSIVE METABOLIC PANEL
ALT: 53 U/L — ABNORMAL HIGH (ref 0–44)
ALT: 71 U/L — ABNORMAL HIGH (ref 0–44)
AST: 57 U/L — ABNORMAL HIGH (ref 15–41)
AST: 102 U/L — ABNORMAL HIGH (ref 15–41)
Albumin: 2.6 g/dL — ABNORMAL LOW (ref 3.5–5.0)
Albumin: 2.4 g/dL — ABNORMAL LOW (ref 3.5–5.0)
Alkaline Phosphatase: 58 U/L (ref 38–126)
Alkaline Phosphatase: 67 U/L (ref 38–126)
Anion gap: 13 (ref 5–15)
Anion gap: 6 (ref 5–15)
BUN: 12 mg/dL (ref 6–20)
BUN: 12 mg/dL (ref 6–20)
CO2: 20 mmol/L — ABNORMAL LOW (ref 22–32)
CO2: 26 mmol/L (ref 22–32)
Calcium: 8 mg/dL — ABNORMAL LOW (ref 8.9–10.3)
Calcium: 7.9 mg/dL — ABNORMAL LOW (ref 8.9–10.3)
Chloride: 104 mmol/L (ref 98–111)
Chloride: 104 mmol/L (ref 98–111)
Creatinine, Ser: 0.87 mg/dL (ref 0.61–1.24)
Creatinine, Ser: 0.8 mg/dL (ref 0.61–1.24)
GFR, Estimated: >60 mL/min (ref >60)
GFR, Estimated: >60 mL/min (ref >60)
Glucose, Bld: 282 mg/dL — ABNORMAL HIGH (ref 70–99)
Glucose, Bld: 192 mg/dL — ABNORMAL HIGH (ref 70–99)
Potassium: 3.8 mmol/L (ref 3.5–5.1)
Potassium: 3.3 mmol/L — ABNORMAL LOW (ref 3.5–5.1)
Sodium: 137 mmol/L (ref 135–145)
Sodium: 136 mmol/L (ref 135–145)
Total Bilirubin: 1.6 mg/dL — ABNORMAL HIGH (ref 0.3–1.2)
Total Bilirubin: 0.6 mg/dL (ref 0.3–1.2)
Total Protein: 6 g/dL — ABNORMAL LOW (ref 6.5–8.1)
Total Protein: 5.5 g/dL — ABNORMAL LOW (ref 6.5–8.1)

## 2021-10-15 LAB — LACTIC ACID, PLASMA
Lactic Acid, Venous: 3 mmol/L (ref 0.5–1.9)
Lactic Acid, Venous: 2.1 mmol/L (ref 0.5–1.9)

## 2021-10-15 LAB — CBG MONITORING, ED
Glucose-Capillary: 248 mg/dL — ABNORMAL HIGH (ref 70–99)
Glucose-Capillary: 273 mg/dL — ABNORMAL HIGH (ref 70–99)

## 2021-10-15 LAB — MAGNESIUM: Magnesium: 1.9 mg/dL (ref 1.7–2.4)

## 2021-10-15 LAB — HEMOGLOBIN A1C
Hgb A1c MFr Bld: 13.2 % — ABNORMAL HIGH (ref 4.8–5.6)
Mean Plasma Glucose: 332.14 mg/dL

## 2021-10-15 LAB — HIV ANTIBODY (ROUTINE TESTING W REFLEX): HIV Screen 4th Generation wRfx: NONREACTIVE

## 2021-10-15 MED ORDER — POLYETHYLENE GLYCOL 3350 17 G PO PACK: 17.0000 g | PACK | Freq: Every day | ORAL | Status: DC | PRN

## 2021-10-15 MED ORDER — INSULIN ASPART 100 UNIT/ML IJ SOLN
0.0000 [IU] | Freq: Three times a day (TID) | INTRAMUSCULAR | Status: DC
  Administered 2021-10-15 (×3): 5 [IU] via SUBCUTANEOUS
  Administered 2021-10-15: 8 [IU] via SUBCUTANEOUS
  Administered 2021-10-16: 2 [IU] via SUBCUTANEOUS
  Administered 2021-10-16: 5 [IU] via SUBCUTANEOUS
  Administered 2021-10-16: 3 [IU] via SUBCUTANEOUS
  Administered 2021-10-16: 2 [IU] via SUBCUTANEOUS
  Administered 2021-10-17 (×2): 5 [IU] via SUBCUTANEOUS
  Administered 2021-10-17: 3 [IU] via SUBCUTANEOUS
  Administered 2021-10-17 – 2021-10-18 (×3): 5 [IU] via SUBCUTANEOUS
  Administered 2021-10-18: 8 [IU] via SUBCUTANEOUS
  Administered 2021-10-18: 11 [IU] via SUBCUTANEOUS
  Administered 2021-10-19: 5 [IU] via SUBCUTANEOUS
  Administered 2021-10-19: 8 [IU] via SUBCUTANEOUS
  Administered 2021-10-19 (×2): 5 [IU] via SUBCUTANEOUS
  Administered 2021-10-20: 11 [IU] via SUBCUTANEOUS
  Administered 2021-10-20: 15 [IU] via SUBCUTANEOUS

## 2021-10-15 MED ORDER — METFORMIN HCL 500 MG PO TABS: 500.0000 mg | ORAL_TABLET | Freq: Two times a day (BID) | ORAL | Status: DC

## 2021-10-15 MED ORDER — ACETAMINOPHEN 325 MG PO TABS
650.0000 mg | ORAL_TABLET | Freq: Four times a day (QID) | ORAL | Status: DC | PRN
  Administered 2021-10-15 – 2021-10-19 (×5): 650 mg via ORAL
  Filled 2021-10-15 (×5): qty 2

## 2021-10-15 MED ORDER — ALBUTEROL SULFATE (2.5 MG/3ML) 0.083% IN NEBU: 2.5000 mg | INHALATION_SOLUTION | RESPIRATORY_TRACT | Status: DC | PRN

## 2021-10-15 MED ORDER — ENOXAPARIN SODIUM 40 MG/0.4ML IJ SOSY
40.0000 mg | PREFILLED_SYRINGE | INTRAMUSCULAR | Status: DC
  Administered 2021-10-15 – 2021-10-18 (×4): 40 mg via SUBCUTANEOUS
  Filled 2021-10-15 (×4): qty 0.4

## 2021-10-15 MED ORDER — LACTATED RINGERS IV SOLN
INTRAVENOUS | Status: AC
Start: 2021-10-15 — End: 2021-10-16

## 2021-10-15 MED ORDER — IOHEXOL 350 MG/ML SOLN
80.0000 mL | Freq: Once | INTRAVENOUS | Status: AC | PRN
  Administered 2021-10-15: 80 mL via INTRAVENOUS

## 2021-10-15 MED ORDER — ONDANSETRON HCL 4 MG PO TABS: 4.0000 mg | ORAL_TABLET | Freq: Four times a day (QID) | ORAL | Status: DC | PRN

## 2021-10-15 MED ORDER — INSULIN GLARGINE-YFGN 100 UNIT/ML ~~LOC~~ SOLN
10.0000 [IU] | Freq: Every day | SUBCUTANEOUS | Status: DC
  Administered 2021-10-15: 10 [IU] via SUBCUTANEOUS
  Filled 2021-10-15 (×2): qty 0.1

## 2021-10-15 MED ORDER — ONDANSETRON HCL 4 MG/2ML IJ SOLN: 4.0000 mg | Freq: Four times a day (QID) | INTRAMUSCULAR | Status: DC | PRN

## 2021-10-15 MED ORDER — ALBUTEROL SULFATE (2.5 MG/3ML) 0.083% IN NEBU
2.5000 mg | INHALATION_SOLUTION | RESPIRATORY_TRACT | Status: DC | PRN
Start: 2021-10-15 — End: 2021-10-15

## 2021-10-15 MED ORDER — LIVING WELL WITH DIABETES BOOK - IN SPANISH
Freq: Once | Status: AC
  Filled 2021-10-15: qty 1

## 2021-10-15 MED ORDER — ACETAMINOPHEN 650 MG RE SUPP: 650.0000 mg | Freq: Four times a day (QID) | RECTAL | Status: DC | PRN

## 2021-10-15 MED ORDER — INSULIN ASPART 100 UNIT/ML IJ SOLN
3.0000 [IU] | Freq: Three times a day (TID) | INTRAMUSCULAR | Status: DC
  Administered 2021-10-15 – 2021-10-17 (×5): 3 [IU] via SUBCUTANEOUS

## 2021-10-15 NOTE — Assessment & Plan Note (Signed)
   Currently normotensive despite not taking any medications.  As needed intravenous antivertiginous for markedly elevated blood pressures

## 2021-10-15 NOTE — Assessment & Plan Note (Addendum)
.   Patient presenting with several days of malaise, poor oral intake and intermittent backslash flank pain. . CT imaging of the chest revealing multifocal predominantly peripheral groundglass and linear opacities throughout both lungs compatible with infectious process. . Patient has been placed on intravenous: Ceftriaxone . Intravenous volume resuscitation. . Providing supplemental oxygen for any associated hypoxia . Providing PRN bronchodilator therapy for any associated wheezing . Blood cultures have been repeated . Legionella and strep pneumoniae antigen testing additionally ordered

## 2021-10-15 NOTE — H&P (Signed)
History and Physical    Patient: Benjamin Thompson MRN: [id number] DOA: 10/14/2021  Date of Service: the patient was seen and examined on 10/15/2021  Patient coming from: Home  Chief Complaint:  Chief Complaint  Patient presents with   Abnormal Lab   Fever    HPI:   51 year old Spanish-speaking male with past medical history of hypertension, gastroesophageal reflux disease, diabetes mellitus type 2 and hyperlipidemia who presents to [facility] emergency department after being found to have positive blood cultures.  Patient had originally began to experience symptoms on 5/30 complaining of an episode of syncope and left chest and shoulder pain.  Patient also complains of lack of appetite and intermittent fevers.  Patient states that he has had multiple sick contacts at work with respiratory symptoms.  After worsening symptoms which developed associated nausea vomiting and lightheadedness patient contacted EMS while at work and was brought into [facility] emergency department for evaluation.  Upon evaluation in the emergency department initial work-up was negative including a negative chest x-ray, negative COVID-19 testing and unremarkable urinalysis.  Patient was discharged home.  The following day blood cultures that were drawn from the patient during that emergency department stay came back positive for Klebsiella.  The patient was then contacted and instructed to come back to the emergency department for repeat evaluation.  Upon evaluation in the emergency department, CT angiogram of the chest was performed and was found to be negative for pulmonary embolism but did reveal multifocal predominantly peripheral groundglass and linear opacities throughout both lungs most compatible with infection.  Patient has been administered intravenous ceftriaxone.  Hospitalist group was then called to assess the patient for admission to the hospital.  Review of Systems: Review of Systems   Constitutional:  Positive for malaise/fatigue.  Musculoskeletal:  Positive for back pain.  Neurological:  Positive for weakness.  All other systems reviewed and are negative.   Past Medical History:  Diagnosis Date   Diabetes mellitus without complication (HCC)    Hypertension     Past Surgical History:  Procedure Laterality Date   APPENDECTOMY      Social History:  reports that he has never smoked. He does not have any smokeless tobacco history on file. He reports current alcohol use. He reports that he does not use drugs.  No Known Allergies  Family History  Problem Relation Age of Onset   Heart disease Neg Hx     Prior to Admission medications   Medication Sig Start Date End Date Taking? Authorizing Provider  cyclobenzaprine (FLEXERIL) 5 MG tablet Take 1 tablet (5 mg total) by mouth 3 (three) times daily as needed for muscle spasms. 07/30/20   Remo Lipps, MD  diclofenac (CATAFLAM) 50 MG tablet Take 1 tablet (50 mg total) by mouth 3 (three) times daily. One tablet TID with food prn pain. 05/13/15   Hayden Rasmussen, NP    Physical Exam:  Vitals:   10/15/21 0100 10/15/21 0200 10/15/21 0300 10/15/21 0400  BP: 140/84 130/80 129/87 139/80  Pulse: 90 88 94 93  Resp: 16 14 14 14   Temp:    98.5 F (36.9 C)  TempSrc:      SpO2: 97% 94% 93% 95%    Constitutional: Awake alert and oriented x3, Patient is experiencing mild distress due to chest pain.  Skin: no rashes, no lesions, poor skin turgor noted. Eyes: Pupils are equally reactive to light.  No evidence of scleral icterus or conjunctival pallor.  ENMT: Dry mucous  membranes noted.  Posterior pharynx clear of any exudate or lesions.   Neck: normal, supple, no masses, no thyromegaly.  No evidence of jugular venous distension.   Respiratory:  scattered rhonchi noted throughout all fields bilaterally.  Mild bibasilar rales.  Normal respiratory effort. No accessory muscle use.  Cardiovascular: Regular rate and rhythm, no  murmurs / rubs / gallops. No extremity edema. 2+ pedal pulses. No carotid bruits.  Chest:   Significant left anterior chest wall pain without crepitus or deformity.   Back:   Nontender without crepitus or deformity. Abdomen: Abdomen is soft and nontender.  No evidence of intra-abdominal masses.  Positive bowel sounds noted in all quadrants.   Musculoskeletal: No joint deformity upper and lower extremities. Good ROM, no contractures. Normal muscle tone.  Neurologic: CN 2-12 grossly intact. Sensation intact.  Patient moving all 4 extremities spontaneously.  Patient is following all commands.  Patient is responsive to verbal stimuli.   Psychiatric: Patient exhibits normal mood with appropriate affect.  Patient seems to possess insight as to their current situation.    Data Reviewed:  I have personally reviewed and interpreted labs, imaging.  Significant findings are:  Urinalysis revealing greater than 500 glucose Lactic acid 2.5 with second lactic acid found to be 2.2.   CBC revealing white blood cell count of 9.7, hematocrit 49.1, platelet count 151.   Chemistry revealing sodium 132, potassium 4.4, glucose 359, BUN 9, creatinine 1.04. No evidence of pulmonary embolism.  Multifocal predominantly peripheral groundglass and linear opacities throughout both lungs most compatible with infection/inflammation.  EKG: Personally reviewed.  Rhythm is normal sinus rhythm with heart rate of 87 bpm.  No dynamic ST segment changes appreciated.    Assessment and Plan: * Pneumonia of both lungs due to Klebsiella pneumoniae [facility]) Patient presenting with several days of malaise, poor oral intake and intermittent backslash flank pain. CT imaging of the chest revealing multifocal predominantly peripheral groundglass and linear opacities throughout both lungs compatible with infectious process. Patient has been placed on intravenous: Ceftriaxone Intravenous volume resuscitation. Providing supplemental oxygen  for any associated hypoxia Providing PRN bronchodilator therapy for any associated wheezing Blood cultures have been repeated Legionella and strep pneumoniae antigen testing additionally ordered   Bacteremia due to Klebsiella pneumoniae Currently treating with intravenous ceftriaxone We will eventually transition to oral antibiotic regimen prior to discharge  Lactic acidosis Lactic acidosis likely secondary to infection with concurrent volume depletion Hydrate patient intravenous isotonic fluids while concurrently treating underlying infection Monitoring serial lactic acid levels to ensure downtrending and resolution.   Uncontrolled type 2 diabetes mellitus with hyperglycemia, without long-term current use of insulin (HCC) Patient been placed on Accu-Cheks before every meal and nightly with sliding scale insulin Holding home regimen of hypoglycemics Initiating patient on modest regimen of basal bolus insulin therapy which can be titrated regularly to achieve excellent glycemic control. Hemoglobin A1C ordered Diabetic Diet   Essential hypertension Currently normotensive despite not taking any medications. As needed intravenous antivertiginous for markedly elevated blood pressures  Mixed hyperlipidemia Continuing home regimen of lipid lowering therapy.        Code Status:  Full code  code status decision has been confirmed with: patient Family Communication: deferred   Consults: None  Severity of Illness:  The appropriate patient status for this patient is OBSERVATION. Observation status is judged to be reasonable and necessary in order to provide the required intensity of service to ensure the patient's safety. The patient's presenting symptoms, physical exam findings, and initial  radiographic and laboratory data in the context of their medical condition is felt to place them at decreased risk for further clinical deterioration. Furthermore, it is anticipated that the  patient will be medically stable for discharge from the hospital within 2 midnights of admission.   Author:  Marinda ElkGeorge J Taj Arteaga MD  10/15/2021 4:53 AM

## 2021-10-15 NOTE — Progress Notes (Addendum)
Patient seen and examined and agree with plan of care as per my partner Dr.Shalhoub who admitted this patient early this morning  63 Hispanic male (works in [employer]) known DM TY 2 HTN reflux Initial presentation 10/13/2021 chills mid back pain nausea vomiting-tachycardic ill-appearing Tmax 103-Rx = symptomatic Tylenol bolus IV fluids and EMS-ultimately went back home as heart rate did come down significantly Tmax have dropped to 100.3  Call received by patient from ED with + blood culture CXR on rearrival = questionable left basilar infiltrate +/- atelectasis-blood culture = Enterobacter + Klebsiella, lactic acid 2.5 Hyperglycemic Rx vancomycin and cefepime cultures repeated CTA = multifocal predominant groundglass linear opacities in both lungs = infection/inflammation?  COVID    BP (!) 145/84   Pulse 85   Temp 98.5 F (36.9 C)   Resp 20   SpO2 97%  Coherent pleasant awake alert no distress Moving all 4 limbs equally S1-S2 no murmur no rub no gallop Cannot really appreciate rales rhonchi Abdomen is soft no rebound no guarding  A/P  Klebsiella pneumonia bacteremia Likely can transition to oral Levaquin in the a.m. if lactic acidosis seems to resolve--will likely need 2 weeks total depending on Sensitivities that arise We will recheck it Continue IVF 125 cc/h Monitor for fever and chills  Poorly controlled DM TY 2 with A1c >13 Continue Lantus 10 units daily sliding scale moderate coverage and 3 units 3 times daily meals It does not appear he was on PTA hypoglycemic agents?  We will get diabetic coordinator to chat with him I will start metformin 500 twice daily in the morning and he will need significant education regarding this  Probable hepatic steatosis in the setting of metabolic syndrome Patient has slightly elevated transaminases and slightly elevated bilirubin this could be secondary to his infection but also likely could be secondary to his habitus He will need  counseling about weight loss  I expect the patient will be here at least 48 hours  Pleas Koch, MD [facility] 1:24 PM

## 2021-10-15 NOTE — Assessment & Plan Note (Signed)
.   Patient been placed on Accu-Cheks before every meal and nightly with sliding scale insulin . Holding home regimen of hypoglycemics . Initiating patient on modest regimen of basal bolus insulin therapy which can be titrated regularly to achieve excellent glycemic control. . Hemoglobin A1C ordered . Diabetic Diet

## 2021-10-15 NOTE — ED Notes (Signed)
Patient was covered in feces. Patient was cleaned up by both techs and provided with clean linen and gown. Wife is at bedside. Pt placed back onto cardiac monitor.

## 2021-10-15 NOTE — Assessment & Plan Note (Signed)
.   Continuing home regimen of lipid lowering therapy.  

## 2021-10-15 NOTE — TOC Initial Note (Addendum)
Transition of Care [facility]) - Initial/Assessment Note    Patient Details  Name: Benjamin Thompson MRN: [id number] Date of Birth: 08/19/1970  Transition of [facility]) CM/SW Contact:    Lockie Pares, RN Phone Number: 10/15/2021, 12:49 PM  Clinical Narrative:                  51 Year old presented  with  chest discomfort, blood cultures drawn and patient was discharged home, however he was called with positive blood cultures and presented back to the ED today. CTA shows no PE but Pneumonia.   Patient is currently uninsured and works part-time. Patient is spanish speaking.  CM will follow for needs, recommendations, and transitions of care.  PCP appointment made Expected Discharge Plan: Home/Self Care Barriers to Discharge: Transportation, Inadequate or no insurance   Patient Goals and CMS Choice        Expected Discharge Plan and Services Expected Discharge Plan: Home/Self Care   Discharge Planning Services: CM Consult   Living arrangements for the past 2 months: Apartment                                      Prior Living Arrangements/Services Living arrangements for the past 2 months: Apartment Lives with:: Spouse Patient language and need for interpreter reviewed:: Yes        Need for Family Participation in Patient Care: Yes (Comment) Care giver support system in place?: Yes (comment)   Criminal Activity/Legal Involvement Pertinent to Current Situation/Hospitalization: No - Comment as needed  Activities of Daily Living      Permission Sought/Granted                  Emotional Assessment       Orientation: : Oriented to Self, Oriented to Place, Oriented to  Time, Oriented to Situation Alcohol / Substance Use: Not Applicable Psych Involvement: No (comment)  Admission diagnosis:  Bacteremia due to Klebsiella pneumoniae [R78.81, B96.1] Patient Active Problem List   Diagnosis Date Noted   Pneumonia of both lungs due to Klebsiella pneumoniae (HCC)  10/15/2021   Essential hypertension 10/15/2021   Mixed hyperlipidemia 10/15/2021   Lactic acidosis 10/15/2021   Bacteremia due to Klebsiella pneumoniae 10/14/2021   TINEA CORPORIS 08/22/2008   Uncontrolled type 2 diabetes mellitus with hyperglycemia, without long-term current use of insulin (HCC) 03/07/2008   MICROSCOPIC HEMATURIA 03/07/2008   GLYCOSURIA 03/07/2008   GASTROESOPHAGEAL REFLUX DISEASE 01/25/2008   CONSTIPATION 01/25/2008   PCP:  Patient, No Pcp Per (Inactive) Pharmacy:   [id number] - [place] - [address] [place] ([place]) [place] [address] Phone: [phone] Fax: [phone]     Social Determinants of Health (SDOH) Interventions    Readmission Risk Interventions     View : No data to display.

## 2021-10-15 NOTE — ED Notes (Signed)
RN to tx 102.1 temp and 273 cbg prior to transfer

## 2021-10-15 NOTE — Assessment & Plan Note (Signed)
   Currently treating with intravenous ceftriaxone  We will eventually transition to oral antibiotic regimen prior to discharge

## 2021-10-15 NOTE — Assessment & Plan Note (Signed)
   Lactic acidosis likely secondary to infection with concurrent volume depletion  Hydrate patient intravenous isotonic fluids while concurrently treating underlying infection  Monitoring serial lactic acid levels to ensure downtrending and resolution.

## 2021-10-16 ENCOUNTER — Inpatient Hospital Stay (HOSPITAL_COMMUNITY): Payer: Self-pay

## 2021-10-16 DIAGNOSIS — A419 Sepsis, unspecified organism: Secondary | ICD-10-CM

## 2021-10-16 LAB — CBC WITH DIFFERENTIAL/PLATELET
Abs Immature Granulocytes: 0.07 10*3/uL (ref 0.00–0.07)
Basophils Absolute: 0 10*3/uL (ref 0.0–0.1)
Basophils Relative: 0 %
Eosinophils Absolute: 0 10*3/uL (ref 0.0–0.5)
Eosinophils Relative: 0 %
HCT: 42.3 % (ref 39.0–52.0)
Hemoglobin: 14.5 g/dL (ref 13.0–17.0)
Immature Granulocytes: 1 %
Lymphocytes Relative: 7 %
Lymphs Abs: 0.7 10*3/uL (ref 0.7–4.0)
MCH: 29.2 pg (ref 26.0–34.0)
MCHC: 34.3 g/dL (ref 30.0–36.0)
MCV: 85.3 fL (ref 80.0–100.0)
Monocytes Absolute: 0.5 10*3/uL (ref 0.1–1.0)
Monocytes Relative: 6 %
Neutro Abs: 7.7 10*3/uL (ref 1.7–7.7)
Neutrophils Relative %: 86 %
Platelets: 83 10*3/uL — ABNORMAL LOW (ref 150–400)
RBC: 4.96 MIL/uL (ref 4.22–5.81)
RDW: 13.4 % (ref 11.5–15.5)
WBC: 8.9 10*3/uL (ref 4.0–10.5)
nRBC: 0 % (ref 0.0–0.2)

## 2021-10-16 LAB — RAPID URINE DRUG SCREEN, HOSP PERFORMED
Amphetamines: NOT DETECTED
Barbiturates: NOT DETECTED
Benzodiazepines: NOT DETECTED
Cocaine: NOT DETECTED
Opiates: NOT DETECTED
Tetrahydrocannabinol: NOT DETECTED

## 2021-10-16 LAB — GLUCOSE, CAPILLARY
Glucose-Capillary: 155 mg/dL — ABNORMAL HIGH (ref 70–99)
Glucose-Capillary: 150 mg/dL — ABNORMAL HIGH (ref 70–99)
Glucose-Capillary: 165 mg/dL — ABNORMAL HIGH (ref 70–99)
Glucose-Capillary: 212 mg/dL — ABNORMAL HIGH (ref 70–99)

## 2021-10-16 LAB — CULTURE, BLOOD (ROUTINE X 2): Special Requests: ADEQUATE

## 2021-10-16 MED ORDER — SODIUM CHLORIDE 0.9 % IV SOLN: INTRAVENOUS | Status: DC

## 2021-10-16 MED ORDER — TECHNETIUM TC 99M MEBROFENIN IV KIT
4.9000 | PACK | Freq: Once | INTRAVENOUS | Status: AC | PRN
  Administered 2021-10-16: 4.9 via INTRAVENOUS

## 2021-10-16 MED ORDER — INSULIN GLARGINE-YFGN 100 UNIT/ML ~~LOC~~ SOLN
15.0000 [IU] | Freq: Every day | SUBCUTANEOUS | Status: DC
  Administered 2021-10-16 – 2021-10-17 (×2): 15 [IU] via SUBCUTANEOUS
  Filled 2021-10-16 (×2): qty 0.15

## 2021-10-16 MED ORDER — MORPHINE SULFATE (PF) 2 MG/ML IV SOLN
1.0000 mg | INTRAVENOUS | Status: DC | PRN
  Administered 2021-10-16 – 2021-10-17 (×3): 1 mg via INTRAVENOUS
  Filled 2021-10-16 (×3): qty 1

## 2021-10-16 MED ORDER — ENSURE ENLIVE PO LIQD
237.0000 mL | Freq: Two times a day (BID) | ORAL | Status: DC
  Administered 2021-10-17 – 2021-10-20 (×6): 237 mL via ORAL

## 2021-10-16 MED ORDER — LIVING WELL WITH DIABETES BOOK - IN SPANISH
Freq: Once | Status: AC
  Filled 2021-10-16: qty 1

## 2021-10-16 MED ORDER — POTASSIUM CHLORIDE CRYS ER 20 MEQ PO TBCR
40.0000 meq | EXTENDED_RELEASE_TABLET | Freq: Four times a day (QID) | ORAL | Status: AC
  Administered 2021-10-16 (×2): 40 meq via ORAL
  Filled 2021-10-16 (×2): qty 2

## 2021-10-16 MED ORDER — ADULT MULTIVITAMIN W/MINERALS CH
1.0000 | ORAL_TABLET | Freq: Every day | ORAL | Status: DC
  Administered 2021-10-16 – 2021-10-20 (×4): 1 via ORAL
  Filled 2021-10-16 (×4): qty 1

## 2021-10-16 MED ORDER — INSULIN STARTER KIT- PEN NEEDLES (SPANISH)
1.0000 | Freq: Once | Status: AC
  Administered 2021-10-16: 1
  Filled 2021-10-16: qty 1

## 2021-10-16 NOTE — Progress Notes (Addendum)
Mobility Specialist Progress Note    10/16/21 1447  Mobility  Activity Ambulated with assistance in hallway  Level of Assistance Contact guard assist, steadying assist  Assistive Device Other (Comment) (IV pole)  Distance Ambulated (ft) 240 ft  Activity Response Tolerated well  $Mobility charge 1 Mobility   Pre-mobility: 91 HR, 100% SpO2  Pt received in bed and agreeable. C/o L shoulder pain. Returned to bed with call bell in reach.   Hildred Alamin Mobility Specialist

## 2021-10-16 NOTE — Progress Notes (Addendum)
Inpatient Diabetes Program Recommendations  AACE/ADA: New Consensus Statement on Inpatient Glycemic Control   Target Ranges:  Prepandial:   less than 140 mg/dL      Peak postprandial:   less than 180 mg/dL (1-2 hours)      Critically ill patients:  140 - 180 mg/dL    Latest Reference Range & Units 10/15/21 07:39 10/15/21 13:48 10/15/21 18:30 10/15/21 21:19 10/16/21 06:22  Glucose-Capillary 70 - 99 mg/dL 017 (H) 494 (H) 496 (H) 253 (H) 155 (H)    Latest Reference Range & Units 10/15/21 05:10  Glucose 70 - 99 mg/dL 759 (H)  Hemoglobin F6B 4.8 - 5.6 % 13.2 (H)   Review of Glycemic Control  Diabetes history: DM2 Outpatient Diabetes medications: Metformin 500 mg BID Current orders for Inpatient glycemic control: Semglee 15 units daily, Novolog 0-15 units AC&HS,  Novolog 3 units TID with meals  Inpatient Diabetes Program Recommendations:    HbgA1C:  A1C 13.2% on 10/15/21 indicating an average glucose of 332 mg/dl over the past 2-3 months. If patient is discharged on insulin, patient would prefer insulin pens. Will need medication assistance from [facility] and help with getting established at [facility] and [facility]  NOTE: Patient admitted with pneumonia, bacteremia, lactic acidosis, and hyperglycemia. Per chart, patient does not have any insurance an no PCP. TOC already consulted. Ordered Living Well with DM book. Will plan to speak with patient today.  Addendum 10/16/21@11 :33- Spoke with patient and his wife at bedside along with [place] (Spanish interpreter on [employer]).  Patient reports that he has DM and takes Metformin 500 mg BID for DM control. Patient reports he has no insurance and he has went to clinic on [address]; last went about 1 year ago. Patient reports that it cost $180 each time he goes to clinic. Patient's wife goes to [facility] and patient is interested in trying to establish care there at the clinic so it would be more affordable to  see a provider. Patient states he does not know much about DM and he can read Spanish. Informed patient that a Living Well with DM book would be ordered and encouraged patient to read through book when received. Discussed A1C results (13.2% on 10/15/21) and explained what an A1C is and informed patient that his current A1C indicates an average glucose of 332 mg/dl over the past 2-3 months. Discussed basic pathophysiology of DM Type 2, basic home care, importance of checking CBGs and maintaining good CBG control to prevent long-term and short-term complications. Reviewed glucose and A1C goals.  Reviewed signs and symptoms of hyperglycemia and hypoglycemia along with treatment for both. Discussed impact of nutrition, exercise, stress, sickness, and medications on diabetes control. Patient does not check glucose or have a glucometer. Provided ReliOn Classic glucometer and testing supplies. Informed patient that it came from [place] and when out of supplies, he could purchase at [facility] for low cost.  Informed patient that he may need to take insulin outpatient. Patient states he has a fear of needles but he is willing to take insulin if needed. Patient's wife reports that she takes insulin (pens) and so does their oldest daughter.  She would be willing to help with insulin injections at home if needed.  Reviewed and demonstrated how to use insulin pen and vial/syringe. Educated patient and spouse on insulin pen use at home. Reviewed all steps of insulin pen including attachment of needle, 2-unit air shot, dialing up dose, giving injection, removing  needle, disposal of sharps, storage of unused insulin, disposal of insulin etc. Patient able to provide successful return demonstration.  Patient feels that insulin pens would be easier and his wife can help with the insulin pens.  Encouraged patient to check glucose and take DM medication as prescribed. Asked patient to take glucometer to appointments so the provider can use  the information to adjust DM medication if needed.   Patient verbalized understanding of information discussed and he states that he has no further questions at this time related to diabetes.   RNs to provide ongoing basic DM education at bedside with this patient and engage patient to actively check blood glucose and administer insulin injections.    Thanks, Orlando Penner, RN, MSN, CDCES Diabetes Coordinator Inpatient Diabetes Program [phone] (Team Pager from 8am to 5pm)

## 2021-10-16 NOTE — Progress Notes (Addendum)
Initial Nutrition Assessment  DOCUMENTATION CODES:   Not applicable  INTERVENTION:   Ensure Plus High Protein po BID, each supplement provides 350 kcal and 20 grams of protein.  MVI with minerals daily.  NUTRITION DIAGNOSIS:   Inadequate oral intake related to decreased appetite as evidenced by per patient/family report.  GOAL:   Patient will meet greater than or equal to 90% of their needs  MONITOR:   PO intake, Supplement acceptance  REASON FOR ASSESSMENT:   Malnutrition Screening Tool    ASSESSMENT:   51 yo male admitted with bilateral PNA d/t Klebsiella pneumoniae, bacteremia. PMH includes DM-2, HTN.  On admission, patient reported recent poor appetite and decreased intake with weight loss. No recent weights available for review. Unable to speak with patient or complete NFPE at this time. Will add PO supplements to maximize protein and calorie intake.   Labs reviewed. K 3.3 CBG: 155-150  Medications reviewed and include Novolog, Semglee.  NUTRITION - FOCUSED PHYSICAL EXAM:  Unable to complete  Diet Order:   Diet Order             Diet heart healthy/carb modified Room service appropriate? Yes with Assist; Fluid consistency: Thin  Diet effective now                   EDUCATION NEEDS:   Not appropriate for education at this time  Skin:  Skin Assessment: Reviewed RN Assessment  Last BM:  6/1  Height:   Ht Readings from Last 1 Encounters:  10/15/21 5\' 5"  (1.651 m)    Weight:   Wt Readings from Last 1 Encounters:  10/15/21 70.1 kg     BMI:  Body mass index is 25.72 kg/m.  Estimated Nutritional Needs:   Kcal:  2000-2200  Protein:  85-100 gm  Fluid:  2-2.2 L    12/15/21 RD, LDN, CNSC Please refer to Amion for contact information.

## 2021-10-16 NOTE — Progress Notes (Addendum)
PROGRESS NOTE    Benjamin Thompson  DTO:[id number] DOB: March 19, 1971 DOA: 10/14/2021 PCP: Patient, No Pcp Per (Inactive)    Chief Complaint  Patient presents with   Abnormal Lab   Fever    Brief Narrative:   51 year old Spanish-speaking male with past medical history of hypertension, gastroesophageal reflux disease, diabetes mellitus type 2 and hyperlipidemia who presents to [facility] emergency department after being found to have positive blood cultures.   Patient had originally began to experience symptoms on 5/30 complaining of an episode of syncope and left chest and shoulder pain.  Patient also complains of lack of appetite and intermittent fevers.  Patient states that he has had multiple sick contacts at work with respiratory symptoms.  After worsening symptoms which developed associated nausea vomiting and lightheadedness patient contacted EMS while at work and was brought into [facility] emergency department for evaluation.   Upon evaluation in the emergency department initial work-up was negative including a negative chest x-ray, negative COVID-19 testing and unremarkable urinalysis.  Patient was discharged home.   The following day blood cultures that were drawn from the patient during that emergency department stay came back positive for Klebsiella.  The patient was then contacted and instructed to come back to the emergency department for repeat evaluation.   Upon evaluation in the emergency department, CT angiogram of the chest was performed and was found to be negative for pulmonary embolism but did reveal multifocal predominantly peripheral groundglass and linear opacities throughout both lungs most compatible with infection.  Patient has been administered intravenous ceftriaxone.  Hospitalist group was then called to assess the patient for admission to the hospital.     Assessment & Plan:   Principal Problem:   Pneumonia of both lungs due to Klebsiella  pneumoniae [facility]) Active Problems:   Bacteremia due to Klebsiella pneumoniae   Lactic acidosis   Uncontrolled type 2 diabetes mellitus with hyperglycemia, without long-term current use of insulin (HCC)   Essential hypertension   Mixed hyperlipidemia  There is ruled IN/present on admission due to Klebsiella pneumonia bacteremia -Sepsis ruled in, with tachypnea, tachycardia, fever of 101.3 and lactic acidosis -Initial consideration is for pneumonia contributing to this, but gram-negative rods bacteremia needs to be further worked up. -Continue with IV Rocephin. -Right upper quadrant ultrasound has been obtained to rule out biliary source of his GNR bacteremia, findings suspicious for acute cholecystitis, discussed with general surgery, recommendation for HIDA scan. -Who with significant left shoulder pain, significant tender to palpation, referred pain from biliary source(usually more right than left), will await for HIDA scan, will continue to monitor clinically.  Addendum: HIDA scan with normal gallbladder, but heterogeneous liver, will obtain CT abdomen pelvis with IV contrast to evaluate liver   Pneumonia of both lungs  -COVID-negative during recent ED visit, continue with IV Rocephin       Uncontrolled type 2 diabetes mellitus with hyperglycemia, without long-term current use of insulin ([place]) -Very poorly controlled with significantly elevated A1c -We will increase Semglee to 15 units.  And continue with insulin sliding scale -We will hold on starting metformin    Essential hypertension Currently normotensive despite not taking any medications. As needed intravenous antivertiginous for markedly elevated blood pressures   Mixed hyperlipidemia Continuing home regimen of lipid lowering therapy.     DVT prophylaxis: Lovenox Code Status: Full Family Communication: none at bedside Disposition:   Status is: Inpatient    Consultants:  General  surgery   Subjective:  Complaining of  severe left shoulder pain.  Objective: Vitals:   10/15/21 2338 10/16/21 0400 10/16/21 0830 10/16/21 1125  BP: 136/88 (!) 144/87 (!) 142/91 132/80  Pulse: 87 87 84 88  Resp: 18 19 18 18   Temp: 98.7 F (37.1 C) 99 F (37.2 C) 98.6 F (37 C) 99.1 F (37.3 C)  TempSrc: Oral Oral Oral Oral  SpO2: 94% 96% 99% 100%  Weight:      Height:        Intake/Output Summary (Last 24 hours) at 10/16/2021 1341 Last data filed at 10/16/2021 0834 Gross per 24 hour  Intake 1374.58 ml  Output 350 ml  Net 1024.58 ml   Filed Weights   10/15/21 1641  Weight: 70.1 kg    Examination:  Awake Alert, Oriented X 3, No new F.N deficits, Normal affect, left shoulder tender to palpation Symmetrical Chest wall movement, Good air movement bilaterally, CTAB RRR,No Gallops,Rubs or new Murmurs, No Parasternal Heave +ve B.Sounds, Abd Soft, No tenderness, No rebound - guarding or rigidity. No Cyanosis, Clubbing or edema, No new Rash or bruise      Data Reviewed: I have personally reviewed following labs and imaging studies  CBC: Recent Labs  Lab 10/14/21 2007 10/15/21 0510 10/15/21 2309  WBC 9.7 8.5 8.9  NEUTROABS 8.8* 7.7 7.7  HGB 16.2 15.4 14.5  HCT 49.1 44.1 42.3  MCV 87.7 85.8 85.3  PLT 151 108* 83*    Basic Metabolic Panel: Recent Labs  Lab 10/13/21 1423 10/14/21 2007 10/15/21 0510 10/15/21 2309  NA  --  132* 137 136  K  --  4.4 3.8 3.3*  CL  --  98 104 104  CO2  --  24 20* 26  GLUCOSE  --  359* 282* 192*  BUN  --  9 12 12   CREATININE  --  1.04 0.87 0.80  CALCIUM  --  8.4* 8.0* 7.9*  MG 1.8  --  1.9  --     GFR: Estimated Creatinine Clearance: 97.2 mL/min (by C-G formula based on SCr of 0.8 mg/dL).  Liver Function Tests: Recent Labs  Lab 10/15/21 0510 10/15/21 2309  AST 57* 102*  ALT 53* 71*  ALKPHOS 58 67  BILITOT 1.6* 0.6  PROT 6.0* 5.5*  ALBUMIN 2.6* 2.4*    CBG: Recent Labs  Lab 10/15/21 1348 10/15/21 1830  10/15/21 2119 10/16/21 0622 10/16/21 1122  GLUCAP 273* 216* 253* 155* 150*     Recent Results (from the past 240 hour(s))  Urine Culture     Status: None   Collection Time: 10/13/21  2:18 PM   Specimen: In/Out Cath Urine  Result Value Ref Range Status   Specimen Description IN/OUT CATH URINE  Final   Special Requests NONE  Final   Culture   Final    NO GROWTH Performed at [facility], [place] [address], [place], [place] [address]    Report Status 10/14/2021 FINAL  Final  Blood Culture (routine x 2)     Status: Abnormal   Collection Time: 10/13/21  2:23 PM   Specimen: BLOOD  Result Value Ref Range Status   Specimen Description BLOOD BLOOD RIGHT HAND  Final   Special Requests   Final    BOTTLES DRAWN AEROBIC AND ANAEROBIC Blood Culture results may not be optimal due to an inadequate volume of blood received in culture bottles   Culture  Setup Time   Final    GRAM NEGATIVE RODS IN BOTH AEROBIC AND ANAEROBIC BOTTLES CRITICAL RESULT CALLED  TO, READ BACK BY AND VERIFIED WITH: K NEAL,RN@0500  10/14/21 [place] Performed at [facility], [place] [address], [place], [place] [address]    Culture KLEBSIELLA PNEUMONIAE (A)  Final   Report Status 10/16/2021 FINAL  Final   Organism ID, Bacteria KLEBSIELLA PNEUMONIAE  Final      Susceptibility   Klebsiella pneumoniae - MIC*    AMPICILLIN >=32 RESISTANT Resistant     CEFAZOLIN <=4 SENSITIVE Sensitive     CEFEPIME <=0.12 SENSITIVE Sensitive     CEFTAZIDIME <=1 SENSITIVE Sensitive     CEFTRIAXONE <=0.25 SENSITIVE Sensitive     CIPROFLOXACIN <=0.25 SENSITIVE Sensitive     GENTAMICIN <=1 SENSITIVE Sensitive     IMIPENEM <=0.25 SENSITIVE Sensitive     TRIMETH/SULFA <=20 SENSITIVE Sensitive     AMPICILLIN/SULBACTAM 4 SENSITIVE Sensitive     PIP/TAZO <=4 SENSITIVE Sensitive     * KLEBSIELLA PNEUMONIAE  Blood Culture ID Panel (Reflexed)     Status: Abnormal   Collection Time: 10/13/21  2:23 PM  Result Value Ref Range Status    Enterococcus faecalis NOT DETECTED NOT DETECTED Final   Enterococcus Faecium NOT DETECTED NOT DETECTED Final   Listeria monocytogenes NOT DETECTED NOT DETECTED Final   Staphylococcus species NOT DETECTED NOT DETECTED Final   Staphylococcus aureus (BCID) NOT DETECTED NOT DETECTED Final   Staphylococcus epidermidis NOT DETECTED NOT DETECTED Final   Staphylococcus lugdunensis NOT DETECTED NOT DETECTED Final   Streptococcus species NOT DETECTED NOT DETECTED Final   Streptococcus agalactiae NOT DETECTED NOT DETECTED Final   Streptococcus pneumoniae NOT DETECTED NOT DETECTED Final   Streptococcus pyogenes NOT DETECTED NOT DETECTED Final   A.calcoaceticus-baumannii NOT DETECTED NOT DETECTED Final   Bacteroides fragilis NOT DETECTED NOT DETECTED Final   Enterobacterales DETECTED (A) NOT DETECTED Final    Comment: Enterobacterales represent a large order of gram negative bacteria, not a single organism. CRITICAL RESULT CALLED TO, READ BACK BY AND VERIFIED WITH: K NEAL,RN@0500  10/14/21 [place]    Enterobacter cloacae complex NOT DETECTED NOT DETECTED Final   Escherichia coli NOT DETECTED NOT DETECTED Final   Klebsiella aerogenes NOT DETECTED NOT DETECTED Final   Klebsiella oxytoca NOT DETECTED NOT DETECTED Final   Klebsiella pneumoniae DETECTED (A) NOT DETECTED Final    Comment: CRITICAL RESULT CALLED TO, READ BACK BY AND VERIFIED WITH: K NEAL,RN@0500  10/14/21 [place]    Proteus species NOT DETECTED NOT DETECTED Final   Salmonella species NOT DETECTED NOT DETECTED Final   Serratia marcescens NOT DETECTED NOT DETECTED Final   Haemophilus influenzae NOT DETECTED NOT DETECTED Final   Neisseria meningitidis NOT DETECTED NOT DETECTED Final   Pseudomonas aeruginosa NOT DETECTED NOT DETECTED Final   Stenotrophomonas maltophilia NOT DETECTED NOT DETECTED Final   Candida albicans NOT DETECTED NOT DETECTED Final   Candida auris NOT DETECTED NOT DETECTED Final   Candida glabrata NOT DETECTED NOT DETECTED Final    Candida krusei NOT DETECTED NOT DETECTED Final   Candida parapsilosis NOT DETECTED NOT DETECTED Final   Candida tropicalis NOT DETECTED NOT DETECTED Final   Cryptococcus neoformans/gattii NOT DETECTED NOT DETECTED Final   CTX-M ESBL NOT DETECTED NOT DETECTED Final   Carbapenem resistance IMP NOT DETECTED NOT DETECTED Final   Carbapenem resistance KPC NOT DETECTED NOT DETECTED Final   Carbapenem resistance NDM NOT DETECTED NOT DETECTED Final   Carbapenem resist OXA 48 LIKE NOT DETECTED NOT DETECTED Final   Carbapenem resistance VIM NOT DETECTED NOT DETECTED Final    Comment: Performed at [facility], [place] [address], [place], [place] [address]  Blood Culture (routine x 2)     Status: Abnormal   Collection Time: 10/13/21  2:38 PM   Specimen: BLOOD  Result Value Ref Range Status   Specimen Description BLOOD BLOOD LEFT WRIST  Final   Special Requests   Final    BOTTLES DRAWN AEROBIC AND ANAEROBIC Blood Culture adequate volume   Culture  Setup Time   Final    GRAM NEGATIVE RODS IN BOTH AEROBIC AND ANAEROBIC BOTTLES CRITICAL VALUE NOTED.  VALUE IS CONSISTENT WITH PREVIOUSLY REPORTED AND CALLED VALUE.    Culture (A)  Final    KLEBSIELLA PNEUMONIAE SUSCEPTIBILITIES PERFORMED ON PREVIOUS CULTURE WITHIN THE LAST 5 DAYS. Performed at [facility], [place] [address] [place], [place] [address]    Report Status 10/16/2021 FINAL  Final  SARS Coronavirus 2 by RT PCR (hospital order, performed in [facility] hospital lab) *cepheid single result test* Anterior Nasal Swab     Status: None   Collection Time: 10/13/21  2:42 PM   Specimen: Anterior Nasal Swab  Result Value Ref Range Status   SARS Coronavirus 2 by RT PCR NEGATIVE NEGATIVE Final    Comment: (NOTE) SARS-CoV-2 target nucleic acids are NOT DETECTED.  The SARS-CoV-2 RNA is generally detectable in upper and lower respiratory specimens during the acute phase of infection. The lowest concentration of SARS-CoV-2 viral copies this  assay can detect is 250 copies / mL. A negative result does not preclude SARS-CoV-2 infection and should not be used as the sole basis for treatment or other patient management decisions.  A negative result may occur with improper specimen collection / handling, submission of specimen other than nasopharyngeal swab, presence of viral mutation(s) within the areas targeted by this assay, and inadequate number of viral copies (<250 copies / mL). A negative result must be combined with clinical observations, patient history, and epidemiological information.  Fact Sheet for Patients:   https://www.patel.info/  Fact Sheet for Healthcare Providers: https://hall.com/  This test is not yet approved or  cleared by the [place] FDA and has been authorized for detection and/or diagnosis of SARS-CoV-2 by FDA under an Emergency Use Authorization (EUA).  This EUA will remain in effect (meaning this test can be used) for the duration of the COVID-19 declaration under Section 564(b)(1) of the Act, 21 U.S.C. section 360bbb-3(b)(1), unless the authorization is terminated or revoked sooner.  Performed at [facility], [place] [address] [place], [place] [address]   Blood culture (routine x 2)     Status: None (Preliminary result)   Collection Time: 10/14/21 11:05 PM   Specimen: BLOOD LEFT FOREARM  Result Value Ref Range Status   Specimen Description BLOOD LEFT FOREARM  Final   Special Requests   Final    BOTTLES DRAWN AEROBIC AND ANAEROBIC Blood Culture results may not be optimal due to an excessive volume of blood received in culture bottles   Culture  Setup Time   Final    GRAM NEGATIVE RODS IN BOTH AEROBIC AND ANAEROBIC BOTTLES CRITICAL VALUE NOTED.  VALUE IS CONSISTENT WITH PREVIOUSLY REPORTED AND CALLED VALUE. CRITICAL RESULT CALLED TO, READ BACK BY AND VERIFIED WITH: PHARMD J.MELIN AT 2094 ON 10/15/2021 BY T.SAAD Performed at [facility], [place] [address], [place], [place] [address]    Culture GRAM NEGATIVE RODS  Final   Report Status PENDING  Incomplete  Blood culture (routine x 2)  Status: None (Preliminary result)   Collection Time: 10/14/21 11:08 PM   Specimen: BLOOD RIGHT HAND  Result Value Ref Range Status   Specimen Description BLOOD RIGHT HAND  Final   Special Requests   Final    BOTTLES DRAWN AEROBIC AND ANAEROBIC Blood Culture adequate volume   Culture  Setup Time   Final    GRAM NEGATIVE RODS IN BOTH AEROBIC AND ANAEROBIC BOTTLES CRITICAL VALUE NOTED.  VALUE IS CONSISTENT WITH PREVIOUSLY REPORTED AND CALLED VALUE. Performed at [facility], [place] [address], [place], [place] [address]    Culture GRAM NEGATIVE RODS  Final   Report Status PENDING  Incomplete         Radiology Studies: CT Angio Chest PE W and/or Wo Contrast  Result Date: 10/15/2021 CLINICAL DATA:  Fever and tachycardia. EXAM: CT ANGIOGRAPHY CHEST WITH CONTRAST TECHNIQUE: Multidetector CT imaging of the chest was performed using the standard protocol during bolus administration of intravenous contrast. Multiplanar CT image reconstructions and MIPs were obtained to evaluate the vascular anatomy. RADIATION DOSE REDUCTION: This exam was performed according to the departmental dose-optimization program which includes automated exposure control, adjustment of the mA and/or kV according to patient size and/or use of iterative reconstruction technique. CONTRAST:  5m OMNIPAQUE IOHEXOL 350 MG/ML SOLN COMPARISON:  Chest x-ray 10/13/2020. FINDINGS: Cardiovascular: Satisfactory opacification of the pulmonary arteries to the segmental level. No evidence of pulmonary embolism. Normal heart size. No pericardial effusion. Mediastinum/Nodes: No enlarged mediastinal, hilar, or axillary lymph nodes. Thyroid gland, trachea, and esophagus demonstrate no significant findings. Lungs/Pleura: There are patchy multifocal ground-glass and linear opacities  throughout both lungs. These are seen predominantly peripherally. There is no pleural effusion or pneumothorax identified. There is a calcified granuloma in the left upper lobe. Trachea and central airways are patent. Upper Abdomen: No acute abnormality. Musculoskeletal: No chest wall abnormality. No acute or significant osseous findings. Review of the MIP images confirms the above findings. IMPRESSION: 1. No evidence for pulmonary embolism. 2. Multifocal predominantly peripheral ground-glass and linear opacities throughout both lungs most compatible with infection/inflammation. COVID pneumonia can have this appearance. Please correlate clinically. Electronically Signed   By: ARonney AstersM.D.   On: 10/15/2021 01:03   UKoreaAbdomen Limited RUQ (LIVER/GB)  Result Date: 10/16/2021 CLINICAL DATA:  51year old male with gram negative bacteremia. Query biliary source. EXAM: ULTRASOUND ABDOMEN LIMITED RIGHT UPPER QUADRANT COMPARISON:  CTA chest 10/15/2021. FINDINGS: Gallbladder: Irregular gallbladder wall thickening, proximally 6 mm with evidence of wall edema and/or pericholecystic fluid (images 3 and 4). Tumefactive sludge suspected within the gallbladder neck on image 13, nonshadowing. No convincing gallstones, but multiple foci of either adherent echogenic sludge to the wall, or small gallbladder polyps (up to 6 mm images 16 and 17-benign, no follow-up imaging recommended. this recommendation follows ACR consensus guidelines: White Paper of the ACR Incidental Findings Committee II on Gallbladder and Biliary Findings. J Am Coll Radiol 2013:;10:953-956.). No sonographic Murphy sign elicited. Common bile duct: Diameter: 4 mm, normal. Liver: Heterogeneous liver parenchyma, with geographic areas of hyper/hypo echo echo echotexture (see image 37). No discrete liver abscess. No intrahepatic biliary ductal dilatation. Portal vein is patent on color Doppler imaging with normal direction of blood flow towards the liver. Other:  Negative visible right kidney.  No free fluid. IMPRESSION: 1. Abnormal gallbladder with wall thickening/edema. Sludge within the lumen, although no gallstones identified. Acute Cholecystitis is difficult to exclude. 2. Furthermore, liver parenchyma appears heterogeneous. No discrete liver mass or abscess,  but recommend further characterization with either Liver protocol Abdomen CT, MRI when feasible. 3. No evidence of bile duct obstruction. Electronically Signed   By: Genevie Ann M.D.   On: 10/16/2021 10:51        Scheduled Meds:  enoxaparin (LOVENOX) injection  40 mg Subcutaneous Q24H   insulin aspart  0-15 Units Subcutaneous TID AC & HS   insulin aspart  3 Units Subcutaneous TID WC   insulin glargine-yfgn  15 Units Subcutaneous Daily   insulin starter kit- pen needles  1 kit Other Once   Continuous Infusions:  sodium chloride Stopped (10/15/21 0601)   sodium chloride 50 mL/hr at 10/16/21 0638   cefTRIAXone (ROCEPHIN)  IV 2 g (10/16/21 0641)     LOS: 1 day       Phillips Climes, MD [facility]   To contact the attending provider between 7A-7P or the covering provider during after hours 7P-7A, please log into the web site [email or web address] and access using universal [place] password for that web site. If you do not have the password, please call the hospital operator.  10/16/2021, 1:41 PM

## 2021-10-17 ENCOUNTER — Inpatient Hospital Stay (HOSPITAL_COMMUNITY): Payer: Self-pay

## 2021-10-17 ENCOUNTER — Telehealth (HOSPITAL_BASED_OUTPATIENT_CLINIC_OR_DEPARTMENT_OTHER): Payer: Self-pay | Admitting: *Deleted

## 2021-10-17 LAB — CULTURE, BLOOD (ROUTINE X 2): Special Requests: ADEQUATE

## 2021-10-17 LAB — COMPREHENSIVE METABOLIC PANEL
ALT: 66 U/L — ABNORMAL HIGH (ref 0–44)
AST: 70 U/L — ABNORMAL HIGH (ref 15–41)
Albumin: 2.2 g/dL — ABNORMAL LOW (ref 3.5–5.0)
Alkaline Phosphatase: 65 U/L (ref 38–126)
Anion gap: 4 — ABNORMAL LOW (ref 5–15)
BUN: 10 mg/dL (ref 6–20)
CO2: 24 mmol/L (ref 22–32)
Calcium: 7.4 mg/dL — ABNORMAL LOW (ref 8.9–10.3)
Chloride: 106 mmol/L (ref 98–111)
Creatinine, Ser: 0.72 mg/dL (ref 0.61–1.24)
GFR, Estimated: >60 mL/min (ref >60)
Glucose, Bld: 175 mg/dL — ABNORMAL HIGH (ref 70–99)
Potassium: 3.3 mmol/L — ABNORMAL LOW (ref 3.5–5.1)
Sodium: 134 mmol/L — ABNORMAL LOW (ref 135–145)
Total Bilirubin: 0.5 mg/dL (ref 0.3–1.2)
Total Protein: 5.3 g/dL — ABNORMAL LOW (ref 6.5–8.1)

## 2021-10-17 LAB — GLUCOSE, CAPILLARY
Glucose-Capillary: 179 mg/dL — ABNORMAL HIGH (ref 70–99)
Glucose-Capillary: 248 mg/dL — ABNORMAL HIGH (ref 70–99)
Glucose-Capillary: 208 mg/dL — ABNORMAL HIGH (ref 70–99)
Glucose-Capillary: 212 mg/dL — ABNORMAL HIGH (ref 70–99)

## 2021-10-17 LAB — CBC
HCT: 40.8 % (ref 39.0–52.0)
Hemoglobin: 14.1 g/dL (ref 13.0–17.0)
MCH: 29.1 pg (ref 26.0–34.0)
MCHC: 34.6 g/dL (ref 30.0–36.0)
MCV: 84.1 fL (ref 80.0–100.0)
Platelets: 98 10*3/uL — ABNORMAL LOW (ref 150–400)
RBC: 4.85 MIL/uL (ref 4.22–5.81)
RDW: 13.7 % (ref 11.5–15.5)
WBC: 6.8 10*3/uL (ref 4.0–10.5)
nRBC: 0 % (ref 0.0–0.2)

## 2021-10-17 LAB — HEPATITIS C ANTIBODY: HCV Ab: NONREACTIVE

## 2021-10-17 LAB — HEPATITIS A ANTIBODY, TOTAL: hep A Total Ab: REACTIVE — AB

## 2021-10-17 LAB — HEPATITIS B SURFACE ANTIGEN: Hepatitis B Surface Ag: NONREACTIVE

## 2021-10-17 LAB — HEPATITIS B CORE ANTIBODY, TOTAL: Hep B Core Total Ab: NONREACTIVE

## 2021-10-17 MED ORDER — TRAMADOL HCL 50 MG PO TABS
50.0000 mg | ORAL_TABLET | Freq: Four times a day (QID) | ORAL | Status: DC | PRN
  Administered 2021-10-17 – 2021-10-19 (×4): 50 mg via ORAL
  Filled 2021-10-17 (×4): qty 1

## 2021-10-17 MED ORDER — INSULIN GLARGINE-YFGN 100 UNIT/ML ~~LOC~~ SOLN
18.0000 [IU] | Freq: Every day | SUBCUTANEOUS | Status: DC
  Filled 2021-10-17: qty 0.18

## 2021-10-17 MED ORDER — INSULIN ASPART 100 UNIT/ML IJ SOLN
5.0000 [IU] | Freq: Three times a day (TID) | INTRAMUSCULAR | Status: DC
  Administered 2021-10-17 – 2021-10-20 (×8): 5 [IU] via SUBCUTANEOUS

## 2021-10-17 MED ORDER — POTASSIUM CHLORIDE CRYS ER 20 MEQ PO TBCR
40.0000 meq | EXTENDED_RELEASE_TABLET | Freq: Four times a day (QID) | ORAL | Status: AC
  Administered 2021-10-17 (×2): 40 meq via ORAL
  Filled 2021-10-17 (×2): qty 2

## 2021-10-17 MED ORDER — IOHEXOL 9 MG/ML PO SOLN
ORAL | Status: AC
  Filled 2021-10-17: qty 1000

## 2021-10-17 MED ORDER — IOHEXOL 300 MG/ML  SOLN
100.0000 mL | Freq: Once | INTRAMUSCULAR | Status: AC | PRN
  Administered 2021-10-17: 100 mL via INTRAVENOUS

## 2021-10-17 MED ORDER — METHOCARBAMOL 500 MG PO TABS
500.0000 mg | ORAL_TABLET | Freq: Four times a day (QID) | ORAL | Status: DC | PRN
  Administered 2021-10-17 – 2021-10-20 (×6): 500 mg via ORAL
  Filled 2021-10-17 (×6): qty 1

## 2021-10-17 MED ORDER — CEFAZOLIN SODIUM-DEXTROSE 2-4 GM/100ML-% IV SOLN
2.0000 g | Freq: Three times a day (TID) | INTRAVENOUS | Status: DC
  Administered 2021-10-18 – 2021-10-20 (×7): 2 g via INTRAVENOUS
  Filled 2021-10-17 (×7): qty 100

## 2021-10-17 NOTE — Progress Notes (Signed)
PROGRESS NOTE    Benjamin Thompson  ZOX:[id number] DOB: February 20, 1971 DOA: 10/14/2021 PCP: Patient, No Pcp Per (Inactive)    Chief Complaint  Patient presents with   Abnormal Lab   Fever    Brief Narrative:   51 year old Spanish-speaking male with past medical history of hypertension, gastroesophageal reflux disease, diabetes mellitus type 2 and hyperlipidemia who presents to [facility] emergency department after being found to have positive blood cultures.   Patient had originally began to experience symptoms on 5/30 complaining of an episode of syncope and left chest and shoulder pain.  Patient also complains of lack of appetite and intermittent fevers.  Patient states that he has had multiple sick contacts at work with respiratory symptoms.  After worsening symptoms which developed associated nausea vomiting and lightheadedness patient contacted EMS while at work and was brought into [facility] emergency department for evaluation.   Upon evaluation in the emergency department initial work-up was negative including a negative chest x-ray, negative COVID-19 testing and unremarkable urinalysis.  Patient was discharged home.   The following day blood cultures that were drawn from the patient during that emergency department stay came back positive for Klebsiella.  The patient was then contacted and instructed to come back to the emergency department for repeat evaluation.   Upon evaluation in the emergency department, CT angiogram of the chest was performed and was found to be negative for pulmonary embolism but did reveal multifocal predominantly peripheral groundglass and linear opacities throughout both lungs most compatible with infection.  Patient has been administered intravenous ceftriaxone.  Hospitalist group was then called to assess the patient for admission to the hospital.     Assessment & Plan:   Principal Problem:   Pneumonia of both lungs due to Klebsiella  pneumoniae [facility](HCC) Active Problems:   Bacteremia due to Klebsiella pneumoniae   Lactic acidosis   Uncontrolled type 2 diabetes mellitus with hyperglycemia, without long-term current use of insulin (HCC)   Essential hypertension   Mixed hyperlipidemia  Sepsis  ruled in/present on admission due to Klebsiella pneumonia bacteremia -Sepsis ruled in, with tachypnea, tachycardia, fever of 101.3 and lactic acidosis -Initial consideration is for pneumonia contributing to this, but gram-negative rods bacteremia needs to be further worked up. -Continue with IV Rocephin. -Telemetry thought to be secondary to pneumonia, but GNR bacteremia usually related to GI or GU source, so further work-up was obtained. -Right upper quadrant ultrasound with heterogeneous liver, gallbladder thickening, but HIDA scan negative for acute cholecystitis. -CT abdomen pelvis significant for liver enhancing ring lesion, likely an abscess, will consult IR for drainage, were discussed with general surgery as well. -ID were consulted as well.   Pneumonia of both lungs  -COVID-negative during recent ED visit, continue with IV Rocephin    Uncontrolled type 2 diabetes mellitus with hyperglycemia, without long-term current use of insulin (HCC) -Very poorly controlled with significantly elevated A1c -We will increase Semglee to 15 units.  And continue with insulin sliding scale -We will hold on starting metformin    Essential hypertension Currently normotensive despite not taking any medications. As needed intravenous antivertiginous for markedly elevated blood pressures   Mixed hyperlipidemia Continuing home regimen of lipid lowering therapy.  Left shoulder pain -Likely related to referral pain, x-ray with no acute findings, will start on tramadol and Robaxin    DVT prophylaxis: Lovenox Code Status: Full Family Communication: none at bedside, discussed at length with the patient and answered all his question via  telemetry interpreter Disposition:  Status is: Inpatient    Consultants:  General surgery   Subjective:  Complaining of severe left shoulder pain.  Objective: Vitals:   10/16/21 2345 10/17/21 0348 10/17/21 0759 10/17/21 1200  BP: (!) 144/92 (!) 148/92 (!) 146/93 (!) 146/92  Pulse: 92 94 88 98  Resp: 18 18 18 20   Temp: 99 F (37.2 C) 98.2 F (36.8 C) 98.8 F (37.1 C) 98.6 F (37 C)  TempSrc: Oral Oral Oral Oral  SpO2: 96% 97% 100% 100%  Weight:      Height:        Intake/Output Summary (Last 24 hours) at 10/17/2021 1235 Last data filed at 10/17/2021 0500 Gross per 24 hour  Intake 1408.72 ml  Output 1750 ml  Net -341.28 ml   Filed Weights   10/15/21 1641  Weight: 70.1 kg    Examination:  Awake Alert, Oriented X 3, No new F.N deficits, Normal affect, left midsternal and supraclavicular tenderness to palpation Symmetrical Chest wall movement, Good air movement bilaterally, CTAB RRR,No Gallops,Rubs or new Murmurs, No Parasternal Heave +ve B.Sounds, Abd Soft, No tenderness, No rebound - guarding or rigidity. No Cyanosis, Clubbing or edema, No new Rash or bruise     Data Reviewed: I have personally reviewed following labs and imaging studies  CBC: Recent Labs  Lab 10/14/21 2007 10/15/21 0510 10/15/21 2309 10/17/21 0431  WBC 9.7 8.5 8.9 6.8  NEUTROABS 8.8* 7.7 7.7  --   HGB 16.2 15.4 14.5 14.1  HCT 49.1 44.1 42.3 40.8  MCV 87.7 85.8 85.3 84.1  PLT 151 108* 83* 98*    Basic Metabolic Panel: Recent Labs  Lab 10/13/21 1423 10/14/21 2007 10/15/21 0510 10/15/21 2309 10/17/21 0431  NA  --  132* 137 136 134*  K  --  4.4 3.8 3.3* 3.3*  CL  --  98 104 104 106  CO2  --  24 20* 26 24  GLUCOSE  --  359* 282* 192* 175*  BUN  --  9 12 12 10   CREATININE  --  1.04 0.87 0.80 0.72  CALCIUM  --  8.4* 8.0* 7.9* 7.4*  MG 1.8  --  1.9  --   --     GFR: Estimated Creatinine Clearance: 97.2 mL/min (by C-G formula based on SCr of 0.72 mg/dL).  Liver Function  Tests: Recent Labs  Lab 10/15/21 0510 10/15/21 2309 10/17/21 0431  AST 57* 102* 70*  ALT 53* 71* 66*  ALKPHOS 58 67 65  BILITOT 1.6* 0.6 0.5  PROT 6.0* 5.5* 5.3*  ALBUMIN 2.6* 2.4* 2.2*    CBG: Recent Labs  Lab 10/16/21 1122 10/16/21 1724 10/16/21 2133 10/17/21 0614 10/17/21 1133  GLUCAP 150* 165* 212* 179* 248*     Recent Results (from the past 240 hour(s))  Urine Culture     Status: None   Collection Time: 10/13/21  2:18 PM   Specimen: In/Out Cath Urine  Result Value Ref Range Status   Specimen Description IN/OUT CATH URINE  Final   Special Requests NONE  Final   Culture   Final    NO GROWTH Performed at [facility], [address] [place], [address] [place]    Report Status 10/14/2021 FINAL  Final  Blood Culture (routine x 2)     Status: Abnormal   Collection Time: 10/13/21  2:23 PM   Specimen: BLOOD  Result Value Ref Range Status   Specimen Description BLOOD BLOOD RIGHT HAND  Final   Special Requests   Final  BOTTLES DRAWN AEROBIC AND ANAEROBIC Blood Culture results may not be optimal due to an inadequate volume of blood received in culture bottles   Culture  Setup Time   Final    GRAM NEGATIVE RODS IN BOTH AEROBIC AND ANAEROBIC BOTTLES CRITICAL RESULT CALLED TO, READ BACK BY AND VERIFIED WITH: K NEAL,RN@0500  10/14/21 MK Performed at [facility], [address] [place], [place] [address]    Culture KLEBSIELLA PNEUMONIAE (A)  Final   Report Status 10/16/2021 FINAL  Final   Organism ID, Bacteria KLEBSIELLA PNEUMONIAE  Final      Susceptibility   Klebsiella pneumoniae - MIC*    AMPICILLIN >=32 RESISTANT Resistant     CEFAZOLIN <=4 SENSITIVE Sensitive     CEFEPIME <=0.12 SENSITIVE Sensitive     CEFTAZIDIME <=1 SENSITIVE Sensitive     CEFTRIAXONE <=0.25 SENSITIVE Sensitive     CIPROFLOXACIN <=0.25 SENSITIVE Sensitive     GENTAMICIN <=1 SENSITIVE Sensitive     IMIPENEM <=0.25 SENSITIVE Sensitive     TRIMETH/SULFA <=20 SENSITIVE Sensitive      AMPICILLIN/SULBACTAM 4 SENSITIVE Sensitive     PIP/TAZO <=4 SENSITIVE Sensitive     * KLEBSIELLA PNEUMONIAE  Blood Culture ID Panel (Reflexed)     Status: Abnormal   Collection Time: 10/13/21  2:23 PM  Result Value Ref Range Status   Enterococcus faecalis NOT DETECTED NOT DETECTED Final   Enterococcus Faecium NOT DETECTED NOT DETECTED Final   Listeria monocytogenes NOT DETECTED NOT DETECTED Final   Staphylococcus species NOT DETECTED NOT DETECTED Final   Staphylococcus aureus (BCID) NOT DETECTED NOT DETECTED Final   Staphylococcus epidermidis NOT DETECTED NOT DETECTED Final   Staphylococcus lugdunensis NOT DETECTED NOT DETECTED Final   Streptococcus species NOT DETECTED NOT DETECTED Final   Streptococcus agalactiae NOT DETECTED NOT DETECTED Final   Streptococcus pneumoniae NOT DETECTED NOT DETECTED Final   Streptococcus pyogenes NOT DETECTED NOT DETECTED Final   A.calcoaceticus-baumannii NOT DETECTED NOT DETECTED Final   Bacteroides fragilis NOT DETECTED NOT DETECTED Final   Enterobacterales DETECTED (A) NOT DETECTED Final    Comment: Enterobacterales represent a large order of gram negative bacteria, not a single organism. CRITICAL RESULT CALLED TO, READ BACK BY AND VERIFIED WITH: K NEAL,RN@0500  10/14/21 MK    Enterobacter cloacae complex NOT DETECTED NOT DETECTED Final   Escherichia coli NOT DETECTED NOT DETECTED Final   Klebsiella aerogenes NOT DETECTED NOT DETECTED Final   Klebsiella oxytoca NOT DETECTED NOT DETECTED Final   Klebsiella pneumoniae DETECTED (A) NOT DETECTED Final    Comment: CRITICAL RESULT CALLED TO, READ BACK BY AND VERIFIED WITH: K NEAL,RN@0500  10/14/21 MK    Proteus species NOT DETECTED NOT DETECTED Final   Salmonella species NOT DETECTED NOT DETECTED Final   Serratia marcescens NOT DETECTED NOT DETECTED Final   Haemophilus influenzae NOT DETECTED NOT DETECTED Final   Neisseria meningitidis NOT DETECTED NOT DETECTED Final   Pseudomonas aeruginosa NOT  DETECTED NOT DETECTED Final   Stenotrophomonas maltophilia NOT DETECTED NOT DETECTED Final   Candida albicans NOT DETECTED NOT DETECTED Final   Candida auris NOT DETECTED NOT DETECTED Final   Candida glabrata NOT DETECTED NOT DETECTED Final   Candida krusei NOT DETECTED NOT DETECTED Final   Candida parapsilosis NOT DETECTED NOT DETECTED Final   Candida tropicalis NOT DETECTED NOT DETECTED Final   Cryptococcus neoformans/gattii NOT DETECTED NOT DETECTED Final   CTX-M ESBL NOT DETECTED NOT DETECTED Final   Carbapenem resistance IMP NOT DETECTED NOT DETECTED Final  Carbapenem resistance KPC NOT DETECTED NOT DETECTED Final   Carbapenem resistance NDM NOT DETECTED NOT DETECTED Final   Carbapenem resist OXA 48 LIKE NOT DETECTED NOT DETECTED Final   Carbapenem resistance VIM NOT DETECTED NOT DETECTED Final    Comment: Performed at [facility], [address], [place], [place] [address]  Blood Culture (routine x 2)     Status: Abnormal   Collection Time: 10/13/21  2:38 PM   Specimen: BLOOD  Result Value Ref Range Status   Specimen Description BLOOD BLOOD LEFT WRIST  Final   Special Requests   Final    BOTTLES DRAWN AEROBIC AND ANAEROBIC Blood Culture adequate volume   Culture  Setup Time   Final    GRAM NEGATIVE RODS IN BOTH AEROBIC AND ANAEROBIC BOTTLES CRITICAL VALUE NOTED.  VALUE IS CONSISTENT WITH PREVIOUSLY REPORTED AND CALLED VALUE.    Culture (A)  Final    KLEBSIELLA PNEUMONIAE SUSCEPTIBILITIES PERFORMED ON PREVIOUS CULTURE WITHIN THE LAST 5 DAYS. Performed at [facility], [address], [place], [place] [address]    Report Status 10/16/2021 FINAL  Final  SARS Coronavirus 2 by RT PCR (hospital order, performed in [facility] hospital lab) *cepheid single result test* Anterior Nasal Swab     Status: None   Collection Time: 10/13/21  2:42 PM   Specimen: Anterior Nasal Swab  Result Value Ref Range Status   SARS Coronavirus 2 by RT PCR NEGATIVE NEGATIVE Final     Comment: (NOTE) SARS-CoV-2 target nucleic acids are NOT DETECTED.  The SARS-CoV-2 RNA is generally detectable in upper and lower respiratory specimens during the acute phase of infection. The lowest concentration of SARS-CoV-2 viral copies this assay can detect is 250 copies / mL. A negative result does not preclude SARS-CoV-2 infection and should not be used as the sole basis for treatment or other patient management decisions.  A negative result may occur with improper specimen collection / handling, submission of specimen other than nasopharyngeal swab, presence of viral mutation(s) within the areas targeted by this assay, and inadequate number of viral copies (<250 copies / mL). A negative result must be combined with clinical observations, patient history, and epidemiological information.  Fact Sheet for Patients:   RoadLapTop.co.za  Fact Sheet for Healthcare Providers: http://kim-miller.com/  This test is not yet approved or  cleared by the [place] FDA and has been authorized for detection and/or diagnosis of SARS-CoV-2 by FDA under an Emergency Use Authorization (EUA).  This EUA will remain in effect (meaning this test can be used) for the duration of the COVID-19 declaration under Section 564(b)(1) of the Act, 21 U.S.C. section 360bbb-3(b)(1), unless the authorization is terminated or revoked sooner.  Performed at [facility], [address], [place], [place] [address]   Blood culture (routine x 2)     Status: Abnormal   Collection Time: 10/14/21 11:05 PM   Specimen: BLOOD LEFT FOREARM  Result Value Ref Range Status   Specimen Description BLOOD LEFT FOREARM  Final   Special Requests   Final    BOTTLES DRAWN AEROBIC AND ANAEROBIC Blood Culture results may not be optimal due to an excessive volume of blood received in culture bottles   Culture  Setup Time   Final    GRAM NEGATIVE RODS IN BOTH AEROBIC AND ANAEROBIC  BOTTLES CRITICAL VALUE NOTED.  VALUE IS CONSISTENT WITH PREVIOUSLY REPORTED AND CALLED VALUE. CRITICAL RESULT CALLED TO, READ BACK BY AND VERIFIED WITH: PHARMD J.MELIN  AT 1117 ON 10/15/2021 BY T.SAAD    Culture (A)  Final    KLEBSIELLA PNEUMONIAE SUSCEPTIBILITIES PERFORMED ON PREVIOUS CULTURE WITHIN THE LAST 5 DAYS. Performed at [facility], [address], [place], [place] [address]    Report Status 10/17/2021 FINAL  Final  Blood culture (routine x 2)     Status: Abnormal   Collection Time: 10/14/21 11:08 PM   Specimen: BLOOD RIGHT HAND  Result Value Ref Range Status   Specimen Description BLOOD RIGHT HAND  Final   Special Requests   Final    BOTTLES DRAWN AEROBIC AND ANAEROBIC Blood Culture adequate volume   Culture  Setup Time   Final    GRAM NEGATIVE RODS IN BOTH AEROBIC AND ANAEROBIC BOTTLES CRITICAL VALUE NOTED.  VALUE IS CONSISTENT WITH PREVIOUSLY REPORTED AND CALLED VALUE.    Culture (A)  Final    KLEBSIELLA PNEUMONIAE SUSCEPTIBILITIES PERFORMED ON PREVIOUS CULTURE WITHIN THE LAST 5 DAYS. Performed at [facility], [address], [place], [place] [address]    Report Status 10/17/2021 FINAL  Final         Radiology Studies: NM Hepatobiliary Liver Func  Result Date: 10/16/2021 CLINICAL DATA:  Bacteremia. EXAM: NUCLEAR MEDICINE HEPATOBILIARY IMAGING TECHNIQUE: Sequential images of the abdomen were obtained out to 60 minutes following intravenous administration of radiopharmaceutical. RADIOPHARMACEUTICALS:  5 mCi Tc-52m  Choletec IV COMPARISON:  None Available. FINDINGS: Prompt uptake and biliary excretion of activity by the liver is seen. Gallbladder activity is visualized, consistent with patency of cystic duct. Biliary activity passes into small bowel, consistent with patent common bile duct. IMPRESSION: Normal examination. Electronically Signed   By: Darliss Cheney M.D.   On: 10/16/2021 15:26   CT ABDOMEN PELVIS W CONTRAST  Result Date: 10/17/2021 CLINICAL  DATA:  Gram-negative bacteremia. Abnormal appearance of liver on recent ultrasound. EXAM: CT ABDOMEN AND PELVIS WITH CONTRAST TECHNIQUE: Multidetector CT imaging of the abdomen and pelvis was performed using the standard protocol following bolus administration of intravenous contrast. RADIATION DOSE REDUCTION: This exam was performed according to the departmental dose-optimization program which includes automated exposure control, adjustment of the mA and/or kV according to patient size and/or use of iterative reconstruction technique. CONTRAST:  OMNIPAQUE IOHEXOL 300 MG/ML  SOLN COMPARISON:  Ultrasound on 10/16/2021 FINDINGS: Lower Chest: Bibasilar atelectasis and tiny bilateral pleural effusions. Hepatobiliary: Low-attenuation lesion with thin irregular enhancing rim is seen in the posterior right hepatic lobe measuring 2.9 x 2.6 cm. This is suspicious for abscess, with neoplasm considered less likely. No other liver lesions identified. Tiny sub-cm calcified gallstone noted, however there is no evidence of cholecystitis or biliary ductal dilatation. Pancreas:  No mass or inflammatory changes. Spleen: Within normal limits in size and appearance. Adrenals/Urinary Tract: No masses identified. Benign-appearing left renal cysts noted (no followup imaging recommended). No evidence of ureteral calculi or hydronephrosis. Unremarkable unopacified urinary bladder. Stomach/Bowel: No evidence of obstruction, inflammatory process or abnormal fluid collections. Tiny amount of fluid noted in right paracolic gutter. Vascular/Lymphatic: No pathologically enlarged lymph nodes. No acute vascular findings. Reproductive: Unremarkable appearance of prostate and seminal vesicles. No mass or other significant abnormality. Other:  None. Musculoskeletal:  No suspicious bone lesions identified. IMPRESSION: 2.9 cm rim enhancing lesion in posterior right hepatic lobe, suspicious for abscess, with neoplasm considered less likely.  Cholelithiasis. No radiographic evidence of cholecystitis. Bibasilar atelectasis and tiny bilateral pleural effusions. Electronically Signed   By: Danae Orleans M.D.   On: 10/17/2021 09:19  DG Shoulder Left  Result Date: 10/16/2021 CLINICAL DATA:  Acute left shoulder pain without known injury. EXAM: LEFT SHOULDER - 2+ VIEW COMPARISON:  None Available. FINDINGS: There is no evidence of fracture or dislocation. There is no evidence of arthropathy or other focal bone abnormality. Soft tissues are unremarkable. IMPRESSION: Negative. Electronically Signed   By: Lupita Raider M.D.   On: 10/16/2021 14:52   US Abdomen Limited RUQ (LIVER/GB)  Result Date: 10/16/2021 CLINICAL DATA:  51 year old male with gram negative bacteremia. Query biliary source. EXAM: ULTRASOUND ABDOMEN LIMITED RIGHT UPPER QUADRANT COMPARISON:  CTA chest 10/15/2021. FINDINGS: Gallbladder: Irregular gallbladder wall thickening, proximally 6 mm with evidence of wall edema and/or pericholecystic fluid (images 3 and 4). Tumefactive sludge suspected within the gallbladder neck on image 13, nonshadowing. No convincing gallstones, but multiple foci of either adherent echogenic sludge to the wall, or small gallbladder polyps (up to 6 mm images 16 and 17-benign, no follow-up imaging recommended. this recommendation follows ACR consensus guidelines: White Paper of the ACR Incidental Findings Committee II on Gallbladder and Biliary Findings. J Am Coll Radiol 2013:;10:953-956.). No sonographic Murphy sign elicited. Common bile duct: Diameter: 4 mm, normal. Liver: Heterogeneous liver parenchyma, with geographic areas of hyper/hypo echo echo echotexture (see image 37). No discrete liver abscess. No intrahepatic biliary ductal dilatation. Portal vein is patent on color Doppler imaging with normal direction of blood flow towards the liver. Other: Negative visible right kidney.  No free fluid. IMPRESSION: 1. Abnormal gallbladder with wall thickening/edema.  Sludge within the lumen, although no gallstones identified. Acute Cholecystitis is difficult to exclude. 2. Furthermore, liver parenchyma appears heterogeneous. No discrete liver mass or abscess, but recommend further characterization with either Liver protocol Abdomen CT, MRI when feasible. 3. No evidence of bile duct obstruction. Electronically Signed   By: Odessa Fleming M.D.   On: 10/16/2021 10:51        Scheduled Meds:  enoxaparin (LOVENOX) injection  40 mg Subcutaneous Q24H   feeding supplement  237 mL Oral BID BM   insulin aspart  0-15 Units Subcutaneous TID AC & HS   insulin aspart  3 Units Subcutaneous TID WC   insulin glargine-yfgn  15 Units Subcutaneous Daily   multivitamin with minerals  1 tablet Oral Daily   potassium chloride  40 mEq Oral Q6H   Continuous Infusions:  sodium chloride Stopped (10/15/21 0601)   sodium chloride 50 mL/hr at 10/17/21 0300   cefTRIAXone (ROCEPHIN)  IV 2 g (10/17/21 8295)     LOS: 2 days       Huey Bienenstock, MD [facility]   To contact the attending provider between 7A-7P or the covering provider during after hours 7P-7A, please log into the web site [email or web address] and access using universal [place] password for that web site. If you do not have the password, please call the hospital operator.  10/17/2021, 12:35 PM

## 2021-10-17 NOTE — Progress Notes (Signed)
Pharmacy Antibiotic Note  Benjamin Thompson is a 51 y.o. male admitted with bacteremia. Blood cultures growing Klebsiella. Pt on ceftriaxone, to transition to cefazolin per ID. Will start tomorrow as pt received ceftriaxone this am.   Plan: Cefazolin 2g IV q8h starting 6/4 0600  Height: 5\' 5"  (165.1 cm) Weight: 70.1 kg (154 lb 8.7 oz) IBW/kg (Calculated) : 61.5  Temp (24hrs), Avg:98.8 F (37.1 C), Min:98.2 F (36.8 C), Max:99.3 F (37.4 C)  Recent Labs  Lab 10/13/21 1841 10/14/21 2007 10/14/21 2008 10/14/21 2225 10/15/21 0510 10/15/21 1640 10/15/21 2309 10/17/21 0431  WBC  --  9.7  --   --  8.5  --  8.9 6.8  CREATININE  --  1.04  --   --  0.87  --  0.80 0.72  LATICACIDVEN 1.7  --  2.5* 2.2*  --  3.0* 2.1*  --      Estimated Creatinine Clearance: 97.2 mL/min (by C-G formula based on SCr of 0.72 mg/dL).    No Known Allergies  Antimicrobials this admission: Cefepime 5/31 x 1 Rocephin 5/31 >> 6/3 Cefazolin 6/4 >>  Microbiology results: 5/30 BCx: Klebsiella 5/31 BCx: Klebsiella  Arrie Senate, PharmD, BCPS, [facility] Clinical Pharmacist [phone] Please check AMION for all [facility] Pharmacy numbers 10/17/2021

## 2021-10-17 NOTE — Telephone Encounter (Signed)
Post ED Visit - Positive Culture Follow-up  Culture report reviewed by antimicrobial stewardship pharmacist: [place] Team [x]  Lorelei Pont, Pharm.D. []  Heide Guile, Pharm.D., BCPS AQ-ID []  Parks Neptune, Pharm.D., BCPS []  Alycia Rossetti, Pharm.D., BCPS []  Ona, Pharm.D., BCPS, AAHIVP []  Legrand Como, Pharm.D., BCPS, AAHIVP []  Salome Arnt, PharmD, BCPS []  Johnnette Gourd, PharmD, BCPS []  Hughes Better, PharmD, BCPS []  Leeroy Cha, PharmD []  Laqueta Linden, PharmD, BCPS []  Albertina Parr, PharmD  [place] Team []  Leodis Sias, PharmD []  Lindell Spar, PharmD []  Royetta Asal, PharmD []  Graylin Shiver, Rph []  Rema Fendt) Glennon Mac, PharmD []  Arlyn Dunning, PharmD []  Netta Cedars, PharmD []  Dia Sitter, PharmD []  Leone Haven, PharmD []  Gretta Arab, PharmD []  Theodis Shove, PharmD []  Peggyann Juba, PharmD []  Reuel Boom, PharmD   Positive blood culture Patient currently admitted on abx and no further patient follow-up is required at this time.  Rosie Fate 10/17/2021, 3:39 PM

## 2021-10-17 NOTE — Consult Note (Signed)
[facility]    Date of Admission:  10/14/2021   Total days of inpatient antibiotics 3        Reason for Consult: Klebsiella bacteremia    Principal Problem:   Pneumonia of both lungs due to Klebsiella pneumoniae (HCC) Active Problems:   Uncontrolled type 2 diabetes mellitus with hyperglycemia, without long-term current use of insulin (HCC)   Bacteremia due to Klebsiella pneumoniae   Essential hypertension   Mixed hyperlipidemia   Lactic acidosis   Assessment: 51 year old Spanish-speaking male admitted due to being called back for positive blood culture. He had initially presented to ED on 5/30 with syncopal episode along with chest and left shoulder pain.  He had been complaining intermittent fevers and loss of appetite.  Found to have Klebsiella pneumoniae bacteremia.  #Klebsiella pneumoniae bactermia likely 2/2 liver abscess #Elevated AST/ALT -CT chest on 6/1 showed ground glass opacities-pt has no respiratory symptoms. -Abdominal ultrasound that HIDA scan was done to look for biliary source of bacteremia. -CT abdomen pelvis showed 2.9 cm liver lesion.  Demographics: He emigrated from [place] in 2001. Denies any other travel. 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 Reports excessive Etoh use at times(weekend/parties 6-8 drinks). Denies tobacco or illicit drug use.  -His fever has resolved on CTX. No leukocytosis present. Keeps reports left shoulder pain-he is not able to raise his left shoulder past 45 degrees. Denies any trauma to the region.   Recommendations:  -D/C ceftriaxone -Start cefazolin -Consult IR for aspiration->please obtain Cx -Agree with MRI liver -MR left shoulder -Hepatitis panel -Ground glass opacities can be followed outpatient   Microbiology:   Antibiotics: Cefepime 5/31 Ceftriaxone 5/31 to present  Cultures: Blood 5/37 pneumonia 22 5/31 202 Pneumonia  Urine 5/30 no growth Other   HPI:  Benjamin Thompson is a 51 y.o. male with hypertension, GERD, diabetes, hyperlipidemia call back to [facility], ED for positive blood cultures.  Patient was initially seen on 5/30 due to a syncopal episode, chest and shoulder pain.  He also been complaining of loss of appetite and intermittent fevers.  Work-up in the ED was unrevealing including negative chest x-ray and COVID test.  On arrival to this admission CT chest showed multifocal predominantly peripheral GGO and linear opacity throughout bilateral lungs compatible with infection/inflammation.  Possible COVID-pneumonia.  He was started on ceftriaxone.  On arrival to the ED he had temp of 102.6, WBC 8K.  He was admitted Klebsiella bacteremia secondary to pneumonia.  There was concern for possible biliary source of bacteremia, HIDA scan showed normal gallbladder.  CT abdomen pelvis showed 2.9 cm rim-enhancing lesion in the posterior right hepatic lobe suspicious for abscess.  ID engaged.    Review of Systems: Review of Systems  All other systems reviewed and are negative.  Past Medical History:  Diagnosis Date   Diabetes mellitus without complication (HCC)    Hypertension     Social History   Tobacco Use   Smoking status: Never  Substance Use Topics   Alcohol use: Yes    Comment: little   Drug use: No    Family History  Problem Relation Age of Onset   Hypertension Brother    Diabetes Brother    Heart disease Neg Hx    Scheduled Meds:  enoxaparin (LOVENOX) injection  40 mg Subcutaneous Q24H   feeding supplement  237 mL Oral BID BM   insulin aspart  0-15 Units Subcutaneous TID AC &  HS   insulin aspart  5 Units Subcutaneous TID WC   [START ON 10/18/2021] insulin glargine-yfgn  18 Units Subcutaneous Daily   multivitamin with minerals  1 tablet Oral Daily   potassium chloride  40 mEq Oral Q6H   Continuous Infusions:  sodium chloride Stopped (10/15/21 0601)   sodium chloride 50 mL/hr at 10/17/21 0300   cefTRIAXone (ROCEPHIN)  IV 2  g (10/17/21 0621)   PRN Meds:.sodium chloride, acetaminophen **OR** acetaminophen, albuterol, methocarbamol, ondansetron **OR** ondansetron (ZOFRAN) IV, polyethylene glycol, traMADol No Known Allergies  OBJECTIVE: Blood pressure (!) 146/92, pulse 98, temperature 98.6 F (37 C), temperature source Oral, resp. rate 20, height 5\' 5"  (1.651 m), weight 70.1 kg, SpO2 100 %.  Physical Exam Constitutional:      General: He is not in acute distress.    Appearance: He is normal weight. He is not toxic-appearing.  HENT:     Head: Normocephalic and atraumatic.     Right Ear: External ear normal.     Left Ear: External ear normal.     Nose: No congestion or rhinorrhea.     Mouth/Throat:     Mouth: Mucous membranes are moist.     Pharynx: Oropharynx is clear.  Eyes:     Extraocular Movements: Extraocular movements intact.     Conjunctiva/sclera: Conjunctivae normal.     Pupils: Pupils are equal, round, and reactive to light.  Cardiovascular:     Rate and Rhythm: Normal rate and regular rhythm.     Heart sounds: No murmur heard.   No friction rub. No gallop.  Pulmonary:     Effort: Pulmonary effort is normal.     Breath sounds: Normal breath sounds.  Abdominal:     General: Abdomen is flat. Bowel sounds are normal.     Palpations: Abdomen is soft.  Musculoskeletal:        General: No swelling.     Cervical back: Normal range of motion and neck supple.     Comments: Left shoulder pain Unable to raise left shoulder past 45 degrees.   Skin:    General: Skin is warm and dry.  Neurological:     General: No focal deficit present.     Mental Status: He is oriented to person, place, and time.  Psychiatric:        Mood and Affect: Mood normal.    Lab Results Lab Results  Component Value Date   WBC 6.8 10/17/2021   HGB 14.1 10/17/2021   HCT 40.8 10/17/2021   MCV 84.1 10/17/2021   PLT 98 (L) 10/17/2021    Lab Results  Component Value Date   CREATININE 0.72 10/17/2021   BUN 10  10/17/2021   NA 134 (L) 10/17/2021   K 3.3 (L) 10/17/2021   CL 106 10/17/2021   CO2 24 10/17/2021    Lab Results  Component Value Date   ALT 66 (H) 10/17/2021   AST 70 (H) 10/17/2021   ALKPHOS 65 10/17/2021   BILITOT 0.5 10/17/2021       12/17/2021, MD [facility] 10/17/2021, 12:53 PM

## 2021-10-18 ENCOUNTER — Inpatient Hospital Stay (HOSPITAL_COMMUNITY): Payer: Self-pay

## 2021-10-18 LAB — GLUCOSE, CAPILLARY
Glucose-Capillary: 216 mg/dL — ABNORMAL HIGH (ref 70–99)
Glucose-Capillary: 257 mg/dL — ABNORMAL HIGH (ref 70–99)
Glucose-Capillary: 332 mg/dL — ABNORMAL HIGH (ref 70–99)
Glucose-Capillary: 226 mg/dL — ABNORMAL HIGH (ref 70–99)

## 2021-10-18 LAB — COMPREHENSIVE METABOLIC PANEL
ALT: 60 U/L — ABNORMAL HIGH (ref 0–44)
AST: 49 U/L — ABNORMAL HIGH (ref 15–41)
Albumin: 2.4 g/dL — ABNORMAL LOW (ref 3.5–5.0)
Alkaline Phosphatase: 84 U/L (ref 38–126)
Anion gap: 4 — ABNORMAL LOW (ref 5–15)
BUN: 11 mg/dL (ref 6–20)
CO2: 25 mmol/L (ref 22–32)
Calcium: 8.2 mg/dL — ABNORMAL LOW (ref 8.9–10.3)
Chloride: 104 mmol/L (ref 98–111)
Creatinine, Ser: 0.72 mg/dL (ref 0.61–1.24)
GFR, Estimated: >60 mL/min (ref >60)
Glucose, Bld: 261 mg/dL — ABNORMAL HIGH (ref 70–99)
Potassium: 4.5 mmol/L (ref 3.5–5.1)
Sodium: 133 mmol/L — ABNORMAL LOW (ref 135–145)
Total Bilirubin: 0.4 mg/dL (ref 0.3–1.2)
Total Protein: 5.9 g/dL — ABNORMAL LOW (ref 6.5–8.1)

## 2021-10-18 LAB — CBC
HCT: 42.9 % (ref 39.0–52.0)
Hemoglobin: 14.7 g/dL (ref 13.0–17.0)
MCH: 29.3 pg (ref 26.0–34.0)
MCHC: 34.3 g/dL (ref 30.0–36.0)
MCV: 85.6 fL (ref 80.0–100.0)
Platelets: 142 10*3/uL — ABNORMAL LOW (ref 150–400)
RBC: 5.01 MIL/uL (ref 4.22–5.81)
RDW: 13.8 % (ref 11.5–15.5)
WBC: 7.7 10*3/uL (ref 4.0–10.5)
nRBC: 0 % (ref 0.0–0.2)

## 2021-10-18 LAB — HEPATITIS A ANTIBODY, IGM: Hep A IgM: NONREACTIVE

## 2021-10-18 MED ORDER — GADOBUTROL 1 MMOL/ML IV SOLN
7.0000 mL | Freq: Once | INTRAVENOUS | Status: AC | PRN
  Administered 2021-10-18: 7 mL via INTRAVENOUS

## 2021-10-18 MED ORDER — MORPHINE SULFATE (PF) 2 MG/ML IV SOLN
1.0000 mg | INTRAVENOUS | Status: DC | PRN
  Administered 2021-10-18 – 2021-10-19 (×6): 2 mg via INTRAVENOUS
  Filled 2021-10-18 (×6): qty 1

## 2021-10-18 MED ORDER — INSULIN GLARGINE-YFGN 100 UNIT/ML ~~LOC~~ SOLN
24.0000 [IU] | Freq: Every day | SUBCUTANEOUS | Status: DC
  Administered 2021-10-18 – 2021-10-19 (×2): 24 [IU] via SUBCUTANEOUS
  Filled 2021-10-18 (×4): qty 0.24

## 2021-10-18 MED ORDER — ENOXAPARIN SODIUM 40 MG/0.4ML IJ SOSY
40.0000 mg | PREFILLED_SYRINGE | INTRAMUSCULAR | Status: DC
  Administered 2021-10-20: 40 mg via SUBCUTANEOUS
  Filled 2021-10-18: qty 0.4

## 2021-10-18 NOTE — Consult Note (Signed)
Chief Complaint: Patient was seen in consultation today for  Chief Complaint  Patient presents with   Abnormal Lab   Fever    Referring Physician(s): Dr. Waldron Labs   Supervising Physician: Sandi Mariscal  Patient Status: Benjamin Thompson - In-pt  History of Present Illness: Benjamin Thompson is a 51 y.o. Spanish-speaking male with a medical history significant for HTN and DM2 who first presented to the [facility] ED 10/13/21 with complaints of worsening back pain, chills, fatigue, nausea/vomiting and dizziness. Work up was negative for any acute findings but there was suspicion for possible viral syndrome. He was treated and discharged home. Post-discharge, his blood cultures returned positive for Enterobacterales and klebsiella pneumoniae and he was advised to return to the ED for further evaluation and possible admission.    He returned to the ED 10/14/21 and was found to have an elevated lactic acid but was without leukocytosis. CTA of the chest was concerning for possible pneumonia. Additional work up revealed a possible liver abscess.   CT abdomen/pelvis with contrast 10/16/21 IMPRESSION: 1. 2.9 cm rim enhancing lesion in posterior right hepatic lobe, suspicious for abscess, with neoplasm considered less likely. 2. Cholelithiasis. No radiographic evidence of cholecystitis. 3. Bibasilar atelectasis and tiny bilateral pleural effusions.  Interventional Radiology was asked to evaluate this patient for an image-guided liver abscess aspiration. IR requested additional imaging to further characterize the liver liver lesion.  MR Liver 10/17/21 IMPRESSION: 1. 2.3 x 2.0 x 2.0 cm lesion in segment 7 of the liver has imaging characteristics compatible with an intrahepatic abscess. 2. Cholelithiasis. No definitive imaging findings to suggest acute cholecystitis are noted at this time. There is a trace volume of fluid in the porta hepatis tracking caudally adjacent to the second portion of the duodenum.  Given the absence of other overt findings of acute cholecystitis, this is favored to be reactive free fluid related to the hepatic abscess at this time. 3. Hepatic steatosis. 4. Small bilateral pleural effusions lying dependently. 5. Additional incidental findings, as above.  Imaging reviewed and procedure approved by Dr. Pascal Lux. IR will plan for an image-guided liver lesion biopsy versus aspiration versus drain placement.   Past Medical History:  Diagnosis Date   Diabetes mellitus without complication ([place])    Hypertension     Past Surgical History:  Procedure Laterality Date   APPENDECTOMY      Allergies: Patient has no known allergies.  Medications: Prior to Admission medications   Medication Sig Start Date End Date Taking? Authorizing Provider  cyclobenzaprine (FLEXERIL) 5 MG tablet Take 1 tablet (5 mg total) by mouth 3 (three) times daily as needed for muscle spasms. 07/30/20  Yes Andrew Au, MD  diclofenac (CATAFLAM) 50 MG tablet Take 1 tablet (50 mg total) by mouth 3 (three) times daily. One tablet TID with food prn pain. 05/13/15  Yes Mabe, Shanon Brow, NP  metFORMIN (GLUCOPHAGE) 500 MG tablet Take 500 mg by mouth 2 (two) times daily with a meal.   Yes [provider]     Family History  Problem Relation Age of Onset   Hypertension Brother    Diabetes Brother    Heart disease Neg Hx     Social History   Socioeconomic History   Marital status: Married    Spouse name: Benjamin Thompson   Number of children: 3   Years of education: Not on file   Highest education level: Not on file  Occupational History   Not on file  Tobacco Use   Smoking  status: Never   Smokeless tobacco: Not on file  Substance and Sexual Activity   Alcohol use: Yes    Comment: little   Drug use: No   Sexual activity: Not on file  Other Topics Concern   Not on file  Social History Narrative   Not on file   Social Determinants of Health   Financial Resource Strain: Not on file  Food  Insecurity: Not on file  Transportation Needs: Not on file  Physical Activity: Not on file  Stress: Not on file  Social Connections: Not on file    Review of Systems: A 12 point ROS discussed and pertinent positives are indicated in the HPI above.  All other systems are negative.  Review of Systems  Constitutional:  Negative for appetite change and fatigue.  Respiratory:  Negative for cough and shortness of breath.   Cardiovascular:  Negative for chest pain and leg swelling.  Gastrointestinal:  Negative for abdominal pain, diarrhea, nausea and vomiting.  Neurological:  Negative for dizziness and headaches.   Vital Signs: BP (!) 147/100 (BP Location: Left Arm)   Pulse (!) 108   Temp 98.5 F (36.9 C) (Oral)   Resp 18   Ht 5\' 5"  (1.651 m)   Wt 154 lb 8.7 oz (70.1 kg)   SpO2 98%   BMI 25.72 kg/m   Physical Exam Constitutional:      General: He is not in acute distress.    Appearance: He is not ill-appearing.  HENT:     Mouth/Throat:     Mouth: Mucous membranes are moist.     Pharynx: Oropharynx is clear.  Cardiovascular:     Rate and Rhythm: Normal rate and regular rhythm.     Pulses: Normal pulses.     Heart sounds: Normal heart sounds.  Pulmonary:     Effort: Pulmonary effort is normal.     Breath sounds: Normal breath sounds.  Abdominal:     General: Bowel sounds are normal.     Palpations: Abdomen is soft.     Tenderness: There is no abdominal tenderness.  Musculoskeletal:     Right lower leg: No edema.     Left lower leg: No edema.  Skin:    General: Skin is warm and dry.  Neurological:     Mental Status: He is alert and oriented to person, place, and time.    Imaging: CT Angio Chest PE W and/or Wo Contrast  Result Date: 10/15/2021 CLINICAL DATA:  Fever and tachycardia. EXAM: CT ANGIOGRAPHY CHEST WITH CONTRAST TECHNIQUE: Multidetector CT imaging of the chest was performed using the standard protocol during bolus administration of intravenous contrast.  Multiplanar CT image reconstructions and MIPs were obtained to evaluate the vascular anatomy. RADIATION DOSE REDUCTION: This exam was performed according to the departmental dose-optimization program which includes automated exposure control, adjustment of the mA and/or kV according to patient size and/or use of iterative reconstruction technique. CONTRAST:  38mL OMNIPAQUE IOHEXOL 350 MG/ML SOLN COMPARISON:  Chest x-ray 10/13/2020. FINDINGS: Cardiovascular: Satisfactory opacification of the pulmonary arteries to the segmental level. No evidence of pulmonary embolism. Normal heart size. No pericardial effusion. Mediastinum/Nodes: No enlarged mediastinal, hilar, or axillary lymph nodes. Thyroid gland, trachea, and esophagus demonstrate no significant findings. Lungs/Pleura: There are patchy multifocal ground-glass and linear opacities throughout both lungs. These are seen predominantly peripherally. There is no pleural effusion or pneumothorax identified. There is a calcified granuloma in the left upper lobe. Trachea and central airways are patent. Upper Abdomen: No  acute abnormality. Musculoskeletal: No chest wall abnormality. No acute or significant osseous findings. Review of the MIP images confirms the above findings. IMPRESSION: 1. No evidence for pulmonary embolism. 2. Multifocal predominantly peripheral ground-glass and linear opacities throughout both lungs most compatible with infection/inflammation. COVID pneumonia can have this appearance. Please correlate clinically. Electronically Signed   By: Ronney Asters M.D.   On: 10/15/2021 01:03   NM Hepatobiliary Liver Func  Result Date: 10/16/2021 CLINICAL DATA:  Bacteremia. EXAM: NUCLEAR MEDICINE HEPATOBILIARY IMAGING TECHNIQUE: Sequential images of the abdomen were obtained out to 60 minutes following intravenous administration of radiopharmaceutical. RADIOPHARMACEUTICALS:  5 mCi Tc-27m  Choletec IV COMPARISON:  None Available. FINDINGS: Prompt uptake and  biliary excretion of activity by the liver is seen. Gallbladder activity is visualized, consistent with patency of cystic duct. Biliary activity passes into small bowel, consistent with patent common bile duct. IMPRESSION: Normal examination. Electronically Signed   By: Ronney Asters M.D.   On: 10/16/2021 15:26   CT ABDOMEN PELVIS W CONTRAST  Result Date: 10/17/2021 CLINICAL DATA:  Gram-negative bacteremia. Abnormal appearance of liver on recent ultrasound. EXAM: CT ABDOMEN AND PELVIS WITH CONTRAST TECHNIQUE: Multidetector CT imaging of the abdomen and pelvis was performed using the standard protocol following bolus administration of intravenous contrast. RADIATION DOSE REDUCTION: This exam was performed according to the departmental dose-optimization program which includes automated exposure control, adjustment of the mA and/or kV according to patient size and/or use of iterative reconstruction technique. CONTRAST:  142mL OMNIPAQUE IOHEXOL 300 MG/ML  SOLN COMPARISON:  Ultrasound on 10/16/2021 FINDINGS: Lower Chest: Bibasilar atelectasis and tiny bilateral pleural effusions. Hepatobiliary: Low-attenuation lesion with thin irregular enhancing rim is seen in the posterior right hepatic lobe measuring 2.9 x 2.6 cm. This is suspicious for abscess, with neoplasm considered less likely. No other liver lesions identified. Tiny sub-cm calcified gallstone noted, however there is no evidence of cholecystitis or biliary ductal dilatation. Pancreas:  No mass or inflammatory changes. Spleen: Within normal limits in size and appearance. Adrenals/Urinary Tract: No masses identified. Benign-appearing left renal cysts noted (no followup imaging recommended). No evidence of ureteral calculi or hydronephrosis. Unremarkable unopacified urinary bladder. Stomach/Bowel: No evidence of obstruction, inflammatory process or abnormal fluid collections. Tiny amount of fluid noted in right paracolic gutter. Vascular/Lymphatic: No  pathologically enlarged lymph nodes. No acute vascular findings. Reproductive: Unremarkable appearance of prostate and seminal vesicles. No mass or other significant abnormality. Other:  None. Musculoskeletal:  No suspicious bone lesions identified. IMPRESSION: 2.9 cm rim enhancing lesion in posterior right hepatic lobe, suspicious for abscess, with neoplasm considered less likely. Cholelithiasis. No radiographic evidence of cholecystitis. Bibasilar atelectasis and tiny bilateral pleural effusions. Electronically Signed   By: Marlaine Hind M.D.   On: 10/17/2021 09:19   MR LIVER W WO CONTRAST  Result Date: 10/18/2021 CLINICAL DATA:  51 year old male with history of bacteremia. Suspected liver abscess. EXAM: MRI ABDOMEN WITHOUT AND WITH CONTRAST TECHNIQUE: Multiplanar multisequence MR imaging of the abdomen was performed both before and after the administration of intravenous contrast. CONTRAST:  16mL GADAVIST GADOBUTROL 1 MMOL/ML IV SOLN COMPARISON:  No prior abdominal MRI. CT the abdomen and pelvis 10/17/2021. Abdominal ultrasound 10/16/2021. FINDINGS: Lower chest: Small bilateral pleural effusions lying dependently. Hepatobiliary: Diffuse loss of signal intensity throughout the hepatic parenchyma on out of phase dual echo images, indicative of a background of hepatic steatosis. In segment 7 of the liver (axial image 42 of series 20 and coronal image 29 of series 24) there is a 2.3 x  2.0 x 2.0 cm lesion which is centrally mildly T1 hypointense, T2 hyperintense, with some ill-defined surrounding T2 hyperintensity and peripheral rim of enhancement on post gadolinium imaging. This lesion mildly restricts diffusion as well. No other suspicious hepatic lesions are noted. No intra or extrahepatic biliary ductal dilatation. Multiple small filling defects are noted within the lumen of the gallbladder, compatible with gallstones. Gallbladder is only mildly distended. Gallbladder wall thickness is normal. No pericholecystic  fluid, although there is a small amount of fluid in the region of the porta hepatis, tracking caudally adjacent to the second portion of the duodenum. Common bile duct measures 4 mm in the porta hepatis. Pancreas: No pancreatic mass. No pancreatic ductal dilatation. No pancreatic or peripancreatic fluid collections or inflammatory changes. Spleen:  Unremarkable. Adrenals/Urinary Tract: In the medial aspect of the upper pole of the left kidney there is a 2.7 cm T1 hypointense, T2 hyperintense lesion, compatible with a simple (Bosniak class 1) cysts. Several other simple appearing subcentimeter cysts are also noted elsewhere in the kidneys bilaterally (no imaging follow-up is recommended). No aggressive appearing renal lesions. No hydroureteronephrosis in the visualized portions of the abdomen. Bilateral adrenal glands are normal in appearance. Stomach/Bowel: Visualized portions are unremarkable. Vascular/Lymphatic: No aneurysm identified in the visualized abdominal vasculature. Circumaortic left renal vein (normal anatomical variant) incidentally noted. No lymphadenopathy noted in the abdomen. Other: No significant volume of ascites noted in the visualized portions of the peritoneal cavity. Musculoskeletal: No aggressive appearing osseous lesions are noted in the visualized portions of the skeleton. IMPRESSION: 1. 2.3 x 2.0 x 2.0 cm lesion in segment 7 of the liver has imaging characteristics compatible with an intrahepatic abscess. 2. Cholelithiasis. No definitive imaging findings to suggest acute cholecystitis are noted at this time. There is a trace volume of fluid in the porta hepatis tracking caudally adjacent to the second portion of the duodenum. Given the absence of other overt findings of acute cholecystitis, this is favored to be reactive free fluid related to the hepatic abscess at this time. 3. Hepatic steatosis. 4. Small bilateral pleural effusions lying dependently. 5. Additional incidental findings, as  above. Electronically Signed   By: Vinnie Langton M.D.   On: 10/18/2021 08:33   DG Chest Port 1 View  Result Date: 10/13/2021 CLINICAL DATA:  Sepsis, syncope. EXAM: PORTABLE CHEST 1 VIEW COMPARISON:  None Available. FINDINGS: The heart size and mediastinal contours are within normal limits. Right lung is clear. Minimal left basilar subsegmental atelectasis or infiltrate is noted. The visualized skeletal structures are unremarkable. IMPRESSION: Minimal left basilar subsegmental atelectasis or infiltrate is noted. Electronically Signed   By: Marijo Conception M.D.   On: 10/13/2021 15:04   DG Shoulder Left  Result Date: 10/16/2021 CLINICAL DATA:  Acute left shoulder pain without known injury. EXAM: LEFT SHOULDER - 2+ VIEW COMPARISON:  None Available. FINDINGS: There is no evidence of fracture or dislocation. There is no evidence of arthropathy or other focal bone abnormality. Soft tissues are unremarkable. IMPRESSION: Negative. Electronically Signed   By: Marijo Conception M.D.   On: 10/16/2021 14:52   US Abdomen Limited RUQ (LIVER/GB)  Result Date: 10/16/2021 CLINICAL DATA:  51 year old male with gram negative bacteremia. Query biliary source. EXAM: ULTRASOUND ABDOMEN LIMITED RIGHT UPPER QUADRANT COMPARISON:  CTA chest 10/15/2021. FINDINGS: Gallbladder: Irregular gallbladder wall thickening, proximally 6 mm with evidence of wall edema and/or pericholecystic fluid (images 3 and 4). Tumefactive sludge suspected within the gallbladder neck on image 13, nonshadowing. No  convincing gallstones, but multiple foci of either adherent echogenic sludge to the wall, or small gallbladder polyps (up to 6 mm images 16 and 17-benign, no follow-up imaging recommended. this recommendation follows ACR consensus guidelines: White Paper of the ACR Incidental Findings Committee II on Gallbladder and Biliary Findings. J Am Coll Radiol 2013:;10:953-956.). No sonographic Murphy sign elicited. Common bile duct: Diameter: 4 mm, normal.  Liver: Heterogeneous liver parenchyma, with geographic areas of hyper/hypo echo echo echotexture (see image 37). No discrete liver abscess. No intrahepatic biliary ductal dilatation. Portal vein is patent on color Doppler imaging with normal direction of blood flow towards the liver. Other: Negative visible right kidney.  No free fluid. IMPRESSION: 1. Abnormal gallbladder with wall thickening/edema. Sludge within the lumen, although no gallstones identified. Acute Cholecystitis is difficult to exclude. 2. Furthermore, liver parenchyma appears heterogeneous. No discrete liver mass or abscess, but recommend further characterization with either Liver protocol Abdomen CT, MRI when feasible. 3. No evidence of bile duct obstruction. Electronically Signed   By: Genevie Ann M.D.   On: 10/16/2021 10:51    Labs:  CBC: Recent Labs    10/15/21 0510 10/15/21 2309 10/17/21 0431 10/18/21 0141  WBC 8.5 8.9 6.8 7.7  HGB 15.4 14.5 14.1 14.7  HCT 44.1 42.3 40.8 42.9  PLT 108* 83* 98* 142*    COAGS: Recent Labs    10/13/21 1423  INR 1.0    BMP: Recent Labs    10/15/21 0510 10/15/21 2309 10/17/21 0431 10/18/21 0141  NA 137 136 134* 133*  K 3.8 3.3* 3.3* 4.5  CL 104 104 106 104  CO2 20* 26 24 25   GLUCOSE 282* 192* 175* 261*  BUN 12 12 10 11   CALCIUM 8.0* 7.9* 7.4* 8.2*  CREATININE 0.87 0.80 0.72 0.72  GFRNONAA >60 >60 >60 >60    LIVER FUNCTION TESTS: Recent Labs    10/15/21 0510 10/15/21 2309 10/17/21 0431 10/18/21 0141  BILITOT 1.6* 0.6 0.5 0.4  AST 57* 102* 70* 49*  ALT 53* 71* 66* 60*  ALKPHOS 58 67 65 84  PROT 6.0* 5.5* 5.3* 5.9*  ALBUMIN 2.6* 2.4* 2.2* 2.4*    TUMOR MARKERS: No results for input(s): AFPTM, CEA, CA199, CHROMGRNA in the last 8760 hours.  Assessment and Plan:  Liver lesion/abscess: Katie Schowalter, 51 year old male, is tentatively scheduled for 10/19/21 for an image-guided liver lesion biopsy versus aspiration versus drain placement. The procedure was discussed  with the patient and his wife at the bedside with the assistance of a video Spanish-speaking interpreter. Time allowed for questions/concerns.    Risks and benefits of this procedure were was discussed with the patient and/or patient's family including, but not limited to bleeding, infection, damage to adjacent structures.   Risks and benefits of NOT doing the procedure were also discussed.   All of the questions were answered and there is agreement to proceed. He will be NPO at midnight. CBC and PT/INR ordered for the morning. Lovenox held.   Consent signed and in chart.  Thank you for this interesting consult.  I greatly enjoyed meeting Adaryll Banta and look forward to participating in their care.  A copy of this report was sent to the requesting provider on this date.  Electronically Signed: Soyla Dryer, AGACNP-BC [phone] 10/18/2021, 11:57 AM   I spent a total of 20 Minutes    in face to face in clinical consultation, greater than 50% of which was counseling/coordinating care for liver lesion biopsy versus aspiration versus drain placement.

## 2021-10-18 NOTE — Progress Notes (Signed)
PROGRESS NOTE    Benjamin Thompson  QIO:[id number] DOB: 1970-07-24 DOA: 10/14/2021 PCP: Patient, No Pcp Per (Inactive)    Chief Complaint  Patient presents with   Abnormal Lab   Fever    Brief Narrative:   51 year old Spanish-speaking male with past medical history of hypertension, gastroesophageal reflux disease, diabetes mellitus type 2 and hyperlipidemia who presents to [facility] emergency department after being found to have positive blood cultures. -Initial suspicion for multifocal pneumonia, but further work-up was pursued given cultures are positive for gram-negative rods, aging significant for liver abscess, as well patient with significant left shoulder pain, MRI is pending.    Assessment & Plan:   Principal Problem:   Pneumonia of both lungs due to Klebsiella pneumoniae [facility]) Active Problems:   Bacteremia due to Klebsiella pneumoniae   Lactic acidosis   Uncontrolled type 2 diabetes mellitus with hyperglycemia, without long-term current use of insulin (HCC)   Essential hypertension   Mixed hyperlipidemia  Sepsis  ruled in/present on admission due to Klebsiella pneumonia bacteremia -Sepsis ruled in, with tachypnea, tachycardia, fever of 101.3 and lactic acidosis -Initial consideration is for pneumonia contributing to this, but gram-negative rods bacteremia needs to be further worked up. -Telemetry thought to be secondary to pneumonia, but GNR bacteremia usually related to GI or GU source, so further work-up was obtained. -Right upper quadrant ultrasound with heterogeneous liver, gallbladder thickening, but HIDA scan negative for acute cholecystitis. -CT abdomen pelvis significant for liver enhancing ring lesion, likely an abscess, cussed with IR, this was confirmed by MRI of the liver, plan for CT-guided aspiration versus drainage tomorrow . -Patient with significant left shoulder tenderness extending to the left clavicle and manubrium, will proceed with MRI of  left shoulder, and if it is positive for infection, then further work-up will be pursued regarding manubrium and clavicle . -ID consult greatly appreciated, Rocephin has been narrowed to cefazolin.  .   Pneumonia of both lungs  -COVID-negative during recent ED visit, continue with IV Rocephin   Uncontrolled type 2 diabetes mellitus with hyperglycemia, without long-term current use of insulin (HCC) -Poorly controlled, with A1c of 13.2.  Increase his Semglee to 24 units, and continue with insulin sliding scale. -Continue to hold metformin.    Essential hypertension -Acceptable, continue with as needed meds.  Mixed hyperlipidemia Continuing home regimen of lipid lowering therapy.  Left shoulder pain -Please see above discussion    DVT prophylaxis: Lovenox(hold tomorrow for procedure) Code Status: Full Family Communication: none at bedside, discussed at length with the patient and answered all his question via telemetry interpreter Disposition:   Status is: Inpatient    Consultants:  ID IR   Subjective:  He is still complaining of severe left shoulder pain.  Objective: Vitals:   10/17/21 2027 10/17/21 2354 10/18/21 0350 10/18/21 0811  BP: (!) 156/97 (!) 156/88 (!) 163/96 (!) 144/93  Pulse: 100 88 89 (!) 113  Resp: Temp: 98.6 F (37 C) 99.1 F (37.3 C) 99.3 F (37.4 C)   TempSrc: Oral Oral Oral   SpO2: 99% 97% 94% 97%  Weight:      Height:        Intake/Output Summary (Last 24 hours) at 10/18/2021 1101 Last data filed at 10/18/2021 1025 Gross per 24 hour  Intake 1565.86 ml  Output 2050 ml  Net -484.14 ml   Filed Weights   10/15/21 1641  Weight: 70.1 kg    Examination:  Awake Alert, Oriented X 3,  No new F.N deficits, Normal affect, left shoulder, left midsternal and supraclavicular tenderness to palpation Symmetrical Chest wall movement, Good air movement bilaterally, CTAB RRR,No Gallops,Rubs or new Murmurs, No Parasternal Heave +ve B.Sounds,  Abd Soft, No tenderness, No rebound - guarding or rigidity. No Cyanosis, Clubbing or edema, No new Rash or bruise      Data Reviewed: I have personally reviewed following labs and imaging studies  CBC: Recent Labs  Lab 10/14/21 2007 10/15/21 0510 10/15/21 2309 10/17/21 0431 10/18/21 0141  WBC 9.7 8.5 8.9 6.8 7.7  NEUTROABS 8.8* 7.7 7.7  --   --   HGB 16.2 15.4 14.5 14.1 14.7  HCT 49.1 44.1 42.3 40.8 42.9  MCV 87.7 85.8 85.3 84.1 85.6  PLT 151 108* 83* 98* 142*    Basic Metabolic Panel: Recent Labs  Lab 10/13/21 1423 10/14/21 2007 10/15/21 0510 10/15/21 2309 10/17/21 0431 10/18/21 0141  NA  --  132* 137 136 134* 133*  K  --  4.4 3.8 3.3* 3.3* 4.5  CL  --  98 104 104 106 104  CO2  --  24 20* GLUCOSE  --  359* 282* 192* 175* 261*  BUN  --  CREATININE  --  1.04 0.87 0.80 0.72 0.72  CALCIUM  --  8.4* 8.0* 7.9* 7.4* 8.2*  MG 1.8  --  1.9  --   --   --     GFR: Estimated Creatinine Clearance: 97.2 mL/min (by C-G formula based on SCr of 0.72 mg/dL).  Liver Function Tests: Recent Labs  Lab 10/15/21 0510 10/15/21 2309 10/17/21 0431 10/18/21 0141  AST 57* 102* 70* 49*  ALT 53* 71* 66* 60*  ALKPHOS 58 67 65 84  BILITOT 1.6* 0.6 0.5 0.4  PROT 6.0* 5.5* 5.3* 5.9*  ALBUMIN 2.6* 2.4* 2.2* 2.4*    CBG: Recent Labs  Lab 10/17/21 0614 10/17/21 1133 10/17/21 1619 10/17/21 2105 10/18/21 0615  GLUCAP 179* 248* 208* 212* 216*     Recent Results (from the past 240 hour(s))  Urine Culture     Status: None   Collection Time: 10/13/21  2:18 PM   Specimen: In/Out Cath Urine  Result Value Ref Range Status   Specimen Description IN/OUT CATH URINE  Final   Special Requests NONE  Final   Culture   Final    NO GROWTH Performed at [facility], [address] [place], [place] [address]    Report Status 10/14/2021 FINAL  Final  Blood Culture (routine x 2)     Status: Abnormal   Collection Time: 10/13/21  2:23 PM   Specimen: BLOOD   Result Value Ref Range Status   Specimen Description BLOOD BLOOD RIGHT HAND  Final   Special Requests   Final    BOTTLES DRAWN AEROBIC AND ANAEROBIC Blood Culture results may not be optimal due to an inadequate volume of blood received in culture bottles   Culture  Setup Time   Final    GRAM NEGATIVE RODS IN BOTH AEROBIC AND ANAEROBIC BOTTLES CRITICAL RESULT CALLED TO, READ BACK BY AND VERIFIED WITH: K NEAL,RN@0500  10/14/21 MK Performed at [facility] Lab, [address], [place], [place] [address]    Culture KLEBSIELLA PNEUMONIAE (A)  Final   Report Status 10/16/2021 FINAL  Final   Organism ID, Bacteria KLEBSIELLA PNEUMONIAE  Final      Susceptibility   Klebsiella pneumoniae - MIC*    AMPICILLIN >=32  RESISTANT Resistant     CEFAZOLIN <=4 SENSITIVE Sensitive     CEFEPIME <=0.12 SENSITIVE Sensitive     CEFTAZIDIME <=1 SENSITIVE Sensitive     CEFTRIAXONE <=0.25 SENSITIVE Sensitive     CIPROFLOXACIN <=0.25 SENSITIVE Sensitive     GENTAMICIN <=1 SENSITIVE Sensitive     IMIPENEM <=0.25 SENSITIVE Sensitive     TRIMETH/SULFA <=20 SENSITIVE Sensitive     AMPICILLIN/SULBACTAM 4 SENSITIVE Sensitive     PIP/TAZO <=4 SENSITIVE Sensitive     * KLEBSIELLA PNEUMONIAE  Blood Culture ID Panel (Reflexed)     Status: Abnormal   Collection Time: 10/13/21  2:23 PM  Result Value Ref Range Status   Enterococcus faecalis NOT DETECTED NOT DETECTED Final   Enterococcus Faecium NOT DETECTED NOT DETECTED Final   Listeria monocytogenes NOT DETECTED NOT DETECTED Final   Staphylococcus species NOT DETECTED NOT DETECTED Final   Staphylococcus aureus (BCID) NOT DETECTED NOT DETECTED Final   Staphylococcus epidermidis NOT DETECTED NOT DETECTED Final   Staphylococcus lugdunensis NOT DETECTED NOT DETECTED Final   Streptococcus species NOT DETECTED NOT DETECTED Final   Streptococcus agalactiae NOT DETECTED NOT DETECTED Final   Streptococcus pneumoniae NOT DETECTED NOT DETECTED Final   Streptococcus pyogenes  NOT DETECTED NOT DETECTED Final   A.calcoaceticus-baumannii NOT DETECTED NOT DETECTED Final   Bacteroides fragilis NOT DETECTED NOT DETECTED Final   Enterobacterales DETECTED (A) NOT DETECTED Final    Comment: Enterobacterales represent a large order of gram negative bacteria, not a single organism. CRITICAL RESULT CALLED TO, READ BACK BY AND VERIFIED WITH: K NEAL,RN@0500  10/14/21 MK    Enterobacter cloacae complex NOT DETECTED NOT DETECTED Final   Escherichia coli NOT DETECTED NOT DETECTED Final   Klebsiella aerogenes NOT DETECTED NOT DETECTED Final   Klebsiella oxytoca NOT DETECTED NOT DETECTED Final   Klebsiella pneumoniae DETECTED (A) NOT DETECTED Final    Comment: CRITICAL RESULT CALLED TO, READ BACK BY AND VERIFIED WITH: K NEAL,RN@0500  10/14/21 MK    Proteus species NOT DETECTED NOT DETECTED Final   Salmonella species NOT DETECTED NOT DETECTED Final   Serratia marcescens NOT DETECTED NOT DETECTED Final   Haemophilus influenzae NOT DETECTED NOT DETECTED Final   Neisseria meningitidis NOT DETECTED NOT DETECTED Final   Pseudomonas aeruginosa NOT DETECTED NOT DETECTED Final   Stenotrophomonas maltophilia NOT DETECTED NOT DETECTED Final   Candida albicans NOT DETECTED NOT DETECTED Final   Candida auris NOT DETECTED NOT DETECTED Final   Candida glabrata NOT DETECTED NOT DETECTED Final   Candida krusei NOT DETECTED NOT DETECTED Final   Candida parapsilosis NOT DETECTED NOT DETECTED Final   Candida tropicalis NOT DETECTED NOT DETECTED Final   Cryptococcus neoformans/gattii NOT DETECTED NOT DETECTED Final   CTX-M ESBL NOT DETECTED NOT DETECTED Final   Carbapenem resistance IMP NOT DETECTED NOT DETECTED Final   Carbapenem resistance KPC NOT DETECTED NOT DETECTED Final   Carbapenem resistance NDM NOT DETECTED NOT DETECTED Final   Carbapenem resist OXA 48 LIKE NOT DETECTED NOT DETECTED Final   Carbapenem resistance VIM NOT DETECTED NOT DETECTED Final    Comment: Performed at [facility], [address], [place], [place] [address]  Blood Culture (routine x 2)     Status: Abnormal   Collection Time: 10/13/21  2:38 PM   Specimen: BLOOD  Result Value Ref Range Status   Specimen Description BLOOD BLOOD LEFT WRIST  Final   Special Requests   Final    BOTTLES DRAWN AEROBIC AND ANAEROBIC Blood Culture  adequate volume   Culture  Setup Time   Final    GRAM NEGATIVE RODS IN BOTH AEROBIC AND ANAEROBIC BOTTLES CRITICAL VALUE NOTED.  VALUE IS CONSISTENT WITH PREVIOUSLY REPORTED AND CALLED VALUE.    Culture (A)  Final    KLEBSIELLA PNEUMONIAE SUSCEPTIBILITIES PERFORMED ON PREVIOUS CULTURE WITHIN THE LAST 5 DAYS. Performed at [facility] Lab, [address] [place], [place] [address]    Report Status 10/16/2021 FINAL  Final  SARS Coronavirus 2 by RT PCR (hospital order, performed in [facility] hospital lab) *cepheid single result test* Anterior Nasal Swab     Status: None   Collection Time: 10/13/21  2:42 PM   Specimen: Anterior Nasal Swab  Result Value Ref Range Status   SARS Coronavirus 2 by RT PCR NEGATIVE NEGATIVE Final    Comment: (NOTE) SARS-CoV-2 target nucleic acids are NOT DETECTED.  The SARS-CoV-2 RNA is generally detectable in upper and lower respiratory specimens during the acute phase of infection. The lowest concentration of SARS-CoV-2 viral copies this assay can detect is 250 copies / mL. A negative result does not preclude SARS-CoV-2 infection and should not be used as the sole basis for treatment or other patient management decisions.  A negative result may occur with improper specimen collection / handling, submission of specimen other than nasopharyngeal swab, presence of viral mutation(s) within the areas targeted by this assay, and inadequate number of viral copies (<250 copies / mL). A negative result must be combined with clinical observations, patient history, and epidemiological information.  Fact Sheet for Patients:    RoadLapTop.co.za  Fact Sheet for Healthcare Providers: http://kim-miller.com/  This test is not yet approved or  cleared by the [place] FDA and has been authorized for detection and/or diagnosis of SARS-CoV-2 by FDA under an Emergency Use Authorization (EUA).  This EUA will remain in effect (meaning this test can be used) for the duration of the COVID-19 declaration under Section 564(b)(1) of the Act, 21 U.S.C. section 360bbb-3(b)(1), unless the authorization is terminated or revoked sooner.  Performed at [facility], [address], [place], [place] [address]   Blood culture (routine x 2)     Status: Abnormal   Collection Time: 10/14/21 11:05 PM   Specimen: BLOOD LEFT FOREARM  Result Value Ref Range Status   Specimen Description BLOOD LEFT FOREARM  Final   Special Requests   Final    BOTTLES DRAWN AEROBIC AND ANAEROBIC Blood Culture results may not be optimal due to an excessive volume of blood received in culture bottles   Culture  Setup Time   Final    GRAM NEGATIVE RODS IN BOTH AEROBIC AND ANAEROBIC BOTTLES CRITICAL VALUE NOTED.  VALUE IS CONSISTENT WITH PREVIOUSLY REPORTED AND CALLED VALUE. CRITICAL RESULT CALLED TO, READ BACK BY AND VERIFIED WITH: PHARMD J.MELIN AT 1117 ON 10/15/2021 BY T.SAAD    Culture (A)  Final    KLEBSIELLA PNEUMONIAE SUSCEPTIBILITIES PERFORMED ON PREVIOUS CULTURE WITHIN THE LAST 5 DAYS. Performed at [facility], [address], [place], [place] [address]    Report Status 10/17/2021 FINAL  Final  Blood culture (routine x 2)     Status: Abnormal   Collection Time: 10/14/21 11:08 PM   Specimen: BLOOD RIGHT HAND  Result Value Ref Range Status   Specimen Description BLOOD RIGHT HAND  Final   Special Requests   Final    BOTTLES DRAWN AEROBIC AND ANAEROBIC Blood Culture adequate volume   Culture  Setup Time  Final    GRAM NEGATIVE RODS IN BOTH AEROBIC AND ANAEROBIC BOTTLES CRITICAL  VALUE NOTED.  VALUE IS CONSISTENT WITH PREVIOUSLY REPORTED AND CALLED VALUE.    Culture (A)  Final    KLEBSIELLA PNEUMONIAE SUSCEPTIBILITIES PERFORMED ON PREVIOUS CULTURE WITHIN THE LAST 5 DAYS. Performed at [facility], [address], [place], [place] [address]    Report Status 10/17/2021 FINAL  Final         Radiology Studies: NM Hepatobiliary Liver Func  Result Date: 10/16/2021 CLINICAL DATA:  Bacteremia. EXAM: NUCLEAR MEDICINE HEPATOBILIARY IMAGING TECHNIQUE: Sequential images of the abdomen were obtained out to 60 minutes following intravenous administration of radiopharmaceutical. RADIOPHARMACEUTICALS:  5 mCi Tc-68m  Choletec IV COMPARISON:  None Available. FINDINGS: Prompt uptake and biliary excretion of activity by the liver is seen. Gallbladder activity is visualized, consistent with patency of cystic duct. Biliary activity passes into small bowel, consistent with patent common bile duct. IMPRESSION: Normal examination. Electronically Signed   By: Darliss Cheney M.D.   On: 10/16/2021 15:26   CT ABDOMEN PELVIS W CONTRAST  Result Date: 10/17/2021 CLINICAL DATA:  Gram-negative bacteremia. Abnormal appearance of liver on recent ultrasound. EXAM: CT ABDOMEN AND PELVIS WITH CONTRAST TECHNIQUE: Multidetector CT imaging of the abdomen and pelvis was performed using the standard protocol following bolus administration of intravenous contrast. RADIATION DOSE REDUCTION: This exam was performed according to the departmental dose-optimization program which includes automated exposure control, adjustment of the mA and/or kV according to patient size and/or use of iterative reconstruction technique. CONTRAST:  OMNIPAQUE IOHEXOL 300 MG/ML  SOLN COMPARISON:  Ultrasound on 10/16/2021 FINDINGS: Lower Chest: Bibasilar atelectasis and tiny bilateral pleural effusions. Hepatobiliary: Low-attenuation lesion with thin irregular enhancing rim is seen in the posterior right hepatic lobe measuring 2.9  x 2.6 cm. This is suspicious for abscess, with neoplasm considered less likely. No other liver lesions identified. Tiny sub-cm calcified gallstone noted, however there is no evidence of cholecystitis or biliary ductal dilatation. Pancreas:  No mass or inflammatory changes. Spleen: Within normal limits in size and appearance. Adrenals/Urinary Tract: No masses identified. Benign-appearing left renal cysts noted (no followup imaging recommended). No evidence of ureteral calculi or hydronephrosis. Unremarkable unopacified urinary bladder. Stomach/Bowel: No evidence of obstruction, inflammatory process or abnormal fluid collections. Tiny amount of fluid noted in right paracolic gutter. Vascular/Lymphatic: No pathologically enlarged lymph nodes. No acute vascular findings. Reproductive: Unremarkable appearance of prostate and seminal vesicles. No mass or other significant abnormality. Other:  None. Musculoskeletal:  No suspicious bone lesions identified. IMPRESSION: 2.9 cm rim enhancing lesion in posterior right hepatic lobe, suspicious for abscess, with neoplasm considered less likely. Cholelithiasis. No radiographic evidence of cholecystitis. Bibasilar atelectasis and tiny bilateral pleural effusions. Electronically Signed   By: Danae Orleans M.D.   On: 10/17/2021 09:19   MR LIVER W WO CONTRAST  Result Date: 10/18/2021 CLINICAL DATA:  51 year old male with history of bacteremia. Suspected liver abscess. EXAM: MRI ABDOMEN WITHOUT AND WITH CONTRAST TECHNIQUE: Multiplanar multisequence MR imaging of the abdomen was performed both before and after the administration of intravenous contrast. CONTRAST:  7mL GADAVIST GADOBUTROL 1 MMOL/ML IV SOLN COMPARISON:  No prior abdominal MRI. CT the abdomen and pelvis 10/17/2021. Abdominal ultrasound 10/16/2021. FINDINGS: Lower chest: Small bilateral pleural effusions lying dependently. Hepatobiliary: Diffuse loss of signal intensity throughout the hepatic parenchyma on out of phase  dual echo images, indicative of a background of hepatic steatosis. In segment 7 of the liver (axial image 42 of series 20  and coronal image 29 of series 24) there is a 2.3 x 2.0 x 2.0 cm lesion which is centrally mildly T1 hypointense, T2 hyperintense, with some ill-defined surrounding T2 hyperintensity and peripheral rim of enhancement on post gadolinium imaging. This lesion mildly restricts diffusion as well. No other suspicious hepatic lesions are noted. No intra or extrahepatic biliary ductal dilatation. Multiple small filling defects are noted within the lumen of the gallbladder, compatible with gallstones. Gallbladder is only mildly distended. Gallbladder wall thickness is normal. No pericholecystic fluid, although there is a small amount of fluid in the region of the porta hepatis, tracking caudally adjacent to the second portion of the duodenum. Common bile duct measures 4 mm in the porta hepatis. Pancreas: No pancreatic mass. No pancreatic ductal dilatation. No pancreatic or peripancreatic fluid collections or inflammatory changes. Spleen:  Unremarkable. Adrenals/Urinary Tract: In the medial aspect of the upper pole of the left kidney there is a 2.7 cm T1 hypointense, T2 hyperintense lesion, compatible with a simple (Bosniak class 1) cysts. Several other simple appearing subcentimeter cysts are also noted elsewhere in the kidneys bilaterally (no imaging follow-up is recommended). No aggressive appearing renal lesions. No hydroureteronephrosis in the visualized portions of the abdomen. Bilateral adrenal glands are normal in appearance. Stomach/Bowel: Visualized portions are unremarkable. Vascular/Lymphatic: No aneurysm identified in the visualized abdominal vasculature. Circumaortic left renal vein (normal anatomical variant) incidentally noted. No lymphadenopathy noted in the abdomen. Other: No significant volume of ascites noted in the visualized portions of the peritoneal cavity. Musculoskeletal: No  aggressive appearing osseous lesions are noted in the visualized portions of the skeleton. IMPRESSION: 1. 2.3 x 2.0 x 2.0 cm lesion in segment 7 of the liver has imaging characteristics compatible with an intrahepatic abscess. 2. Cholelithiasis. No definitive imaging findings to suggest acute cholecystitis are noted at this time. There is a trace volume of fluid in the porta hepatis tracking caudally adjacent to the second portion of the duodenum. Given the absence of other overt findings of acute cholecystitis, this is favored to be reactive free fluid related to the hepatic abscess at this time. 3. Hepatic steatosis. 4. Small bilateral pleural effusions lying dependently. 5. Additional incidental findings, as above. Electronically Signed   By: Trudie Reed M.D.   On: 10/18/2021 08:33   DG Shoulder Left  Result Date: 10/16/2021 CLINICAL DATA:  Acute left shoulder pain without known injury. EXAM: LEFT SHOULDER - 2+ VIEW COMPARISON:  None Available. FINDINGS: There is no evidence of fracture or dislocation. There is no evidence of arthropathy or other focal bone abnormality. Soft tissues are unremarkable. IMPRESSION: Negative. Electronically Signed   By: Lupita Raider M.D.   On: 10/16/2021 14:52        Scheduled Meds:  enoxaparin (LOVENOX) injection  40 mg Subcutaneous Q24H   feeding supplement  237 mL Oral BID BM   insulin aspart  0-15 Units Subcutaneous TID AC & HS   insulin aspart  5 Units Subcutaneous TID WC   insulin glargine-yfgn  24 Units Subcutaneous Daily   multivitamin with minerals  1 tablet Oral Daily   Continuous Infusions:  sodium chloride Stopped (10/15/21 0601)   sodium chloride 50 mL/hr at 10/18/21 0400    ceFAZolin (ANCEF) IV 2 g (10/18/21 1610)     LOS: 3 days       Huey Bienenstock, MD [facility]   To contact the attending provider between 7A-7P or the covering provider during after hours 7P-7A, please log into the web site  [email or web address] and access  using universal [place] password for that web site. If you do not have the password, please call the hospital operator.  10/18/2021, 11:01 AM

## 2021-10-19 ENCOUNTER — Inpatient Hospital Stay (HOSPITAL_COMMUNITY): Payer: Self-pay

## 2021-10-19 DIAGNOSIS — K750 Abscess of liver: Secondary | ICD-10-CM

## 2021-10-19 LAB — GLUCOSE, CAPILLARY
Glucose-Capillary: 211 mg/dL — ABNORMAL HIGH (ref 70–99)
Glucose-Capillary: 213 mg/dL — ABNORMAL HIGH (ref 70–99)
Glucose-Capillary: 278 mg/dL — ABNORMAL HIGH (ref 70–99)
Glucose-Capillary: 247 mg/dL — ABNORMAL HIGH (ref 70–99)

## 2021-10-19 LAB — COMPREHENSIVE METABOLIC PANEL
ALT: 55 U/L — ABNORMAL HIGH (ref 0–44)
AST: 41 U/L (ref 15–41)
Albumin: 2.5 g/dL — ABNORMAL LOW (ref 3.5–5.0)
Alkaline Phosphatase: 92 U/L (ref 38–126)
Anion gap: 8 (ref 5–15)
BUN: 9 mg/dL (ref 6–20)
CO2: 29 mmol/L (ref 22–32)
Calcium: 8.4 mg/dL — ABNORMAL LOW (ref 8.9–10.3)
Chloride: 100 mmol/L (ref 98–111)
Creatinine, Ser: 0.71 mg/dL (ref 0.61–1.24)
GFR, Estimated: >60 mL/min (ref >60)
Glucose, Bld: 218 mg/dL — ABNORMAL HIGH (ref 70–99)
Potassium: 4.1 mmol/L (ref 3.5–5.1)
Sodium: 137 mmol/L (ref 135–145)
Total Bilirubin: 0.7 mg/dL (ref 0.3–1.2)
Total Protein: 6.3 g/dL — ABNORMAL LOW (ref 6.5–8.1)

## 2021-10-19 LAB — CBC
HCT: 44.9 % (ref 39.0–52.0)
Hemoglobin: 15.3 g/dL (ref 13.0–17.0)
MCH: 28.9 pg (ref 26.0–34.0)
MCHC: 34.1 g/dL (ref 30.0–36.0)
MCV: 84.9 fL (ref 80.0–100.0)
Platelets: 195 10*3/uL (ref 150–400)
RBC: 5.29 MIL/uL (ref 4.22–5.81)
RDW: 13.7 % (ref 11.5–15.5)
WBC: 8.5 10*3/uL (ref 4.0–10.5)
nRBC: 0 % (ref 0.0–0.2)

## 2021-10-19 LAB — PROTIME-INR
INR: 1 (ref 0.8–1.2)
Prothrombin Time: 13.3 seconds (ref 11.4–15.2)

## 2021-10-19 LAB — HEPATITIS B SURFACE ANTIBODY, QUANTITATIVE: Hep B S AB Quant (Post): <3.1 m[IU]/mL — ABNORMAL LOW (ref >9.9)

## 2021-10-19 MED ORDER — LIDOCAINE HCL 1 % IJ SOLN
INTRAMUSCULAR | Status: AC
  Filled 2021-10-19: qty 10

## 2021-10-19 MED ORDER — FENTANYL CITRATE (PF) 100 MCG/2ML IJ SOLN
INTRAMUSCULAR | Status: AC
  Filled 2021-10-19: qty 2

## 2021-10-19 MED ORDER — MIDAZOLAM HCL 2 MG/2ML IJ SOLN
INTRAMUSCULAR | Status: AC | PRN
  Administered 2021-10-19: 1 mg via INTRAVENOUS

## 2021-10-19 MED ORDER — FENTANYL CITRATE (PF) 100 MCG/2ML IJ SOLN
INTRAMUSCULAR | Status: AC | PRN
  Administered 2021-10-19: 50 ug via INTRAVENOUS

## 2021-10-19 MED ORDER — HYDROCODONE-ACETAMINOPHEN 5-325 MG PO TABS
1.0000 | ORAL_TABLET | ORAL | Status: DC | PRN
  Administered 2021-10-20 (×2): 2 via ORAL
  Filled 2021-10-19 (×2): qty 2

## 2021-10-19 MED ORDER — MIDAZOLAM HCL 2 MG/2ML IJ SOLN
INTRAMUSCULAR | Status: AC
  Filled 2021-10-19: qty 2

## 2021-10-19 NOTE — Procedures (Signed)
  Procedure:  CT aspiration R lobe liver abscess 55ml purulent to GS, C&S Preprocedure diagnosis: The encounter diagnosis was Bacteremia.  Postprocedure diagnosis: same EBL:    minimal Complications:   none immediate  See full dictation in [employer].  Thora Lance MD Main # [phone] Pager  [phone] Mobile [phone]

## 2021-10-19 NOTE — Progress Notes (Signed)
[facility]  Date of Admission:  10/14/2021   Total days of inpatient antibiotics 5  Principal Problem:   Pneumonia of both lungs due to Klebsiella pneumoniae [facility]) Active Problems:   Uncontrolled type 2 diabetes mellitus with hyperglycemia, without long-term current use of insulin (HCC)   Bacteremia due to Klebsiella pneumoniae   Essential hypertension   Mixed hyperlipidemia   Lactic acidosis   Liver abscess          Assessment: 51 year old Spanish-speaking male admitted due to being called back for positive blood culture. He had initially presented to ED on 5/30 with syncopal episode along with chest and left shoulder pain.  He had been complaining intermittent fevers and loss of appetite.  Found to have Klebsiella pneumoniae bacteremia.   #Klebsiella pneumoniae bactermia likely 2/2 liver abscess #Elevated AST/ALT -CT chest on 6/1 showed ground glass opacities-pt has no respiratory symptoms. -Abdominal ultrasound that HIDA scan was done to look for biliary source of bacteremia. -CT abdomen pelvis showed 2.9 cm liver lesion.  Demographics: He emigrated from [place] in 2001. Denies any other travel. 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 Reports excessive Etoh use at times(weekend/parties 6-8 drinks). Denies tobacco or illicit drug use.  MR liver showed 2.3 x 2.0 x 2.0 cm liver lesion.    Recommendations:  -Continue cefazolin -Consult IR for aspiration->please obtain Cx -MR left shoulder -Ground glass opacities can be followed outpatient  Microbiology:   Antibiotics: Cefepime 5/31 Ceftriaxone 5/31 to 6/2 Cefazolin 6/03-p   Cultures: Blood 5/37 pneumonia 22 5/31 202 Pneumonia   Urine 5/30 no growth  SUBJECTIVE: Resting in bed. Reports shoulder pain. Denies abdominal pian Interval: 100.3 overnight. wbc 8.5k Review of Systems: Review of Systems  All other systems reviewed and are negative.   Scheduled Meds:   [START ON 10/20/2021] enoxaparin (LOVENOX) injection  40 mg Subcutaneous Q24H   feeding supplement  237 mL Oral BID BM   fentaNYL       insulin aspart  0-15 Units Subcutaneous TID AC & HS   insulin aspart  5 Units Subcutaneous TID WC   insulin glargine-yfgn  24 Units Subcutaneous Daily   lidocaine       midazolam       multivitamin with minerals  1 tablet Oral Daily   Continuous Infusions:  sodium chloride Stopped (10/15/21 0601)   sodium chloride 50 mL/hr at 10/18/21 1900    ceFAZolin (ANCEF) IV 2 g (10/19/21 1310)   PRN Meds:.sodium chloride, acetaminophen **OR** acetaminophen, albuterol, HYDROcodone-acetaminophen, methocarbamol, morphine injection, ondansetron **OR** ondansetron (ZOFRAN) IV, polyethylene glycol, traMADol No Known Allergies  OBJECTIVE: Vitals:   10/19/21 0955 10/19/21 1200 10/19/21 1311 10/19/21 1420  BP: (!) 142/82 (!) 156/98 (!) 137/94 138/82  Pulse: 91 95 (!) 109 96  Resp: 18 20 20 20   Temp:  99.2 F (37.3 C) 100.3 F (37.9 C)   TempSrc:  Oral Oral   SpO2: 94% 98% 96% 93%  Weight:      Height:       Body mass index is 25.72 kg/m.  Physical Exam Constitutional:      General: He is not in acute distress.    Appearance: He is normal weight. He is not toxic-appearing.  HENT:     Head: Normocephalic and atraumatic.     Right Ear: External ear normal.     Left Ear: External ear normal.     Nose: No congestion or rhinorrhea.  Mouth/Throat:     Mouth: Mucous membranes are moist.     Pharynx: Oropharynx is clear.  Eyes:     Extraocular Movements: Extraocular movements intact.     Conjunctiva/sclera: Conjunctivae normal.     Pupils: Pupils are equal, round, and reactive to light.  Cardiovascular:     Rate and Rhythm: Normal rate and regular rhythm.     Heart sounds: No murmur heard.   No friction rub. No gallop.  Pulmonary:     Effort: Pulmonary effort is normal.     Breath sounds: Normal breath sounds.  Abdominal:     General: Abdomen is flat.  Bowel sounds are normal.     Palpations: Abdomen is soft.  Musculoskeletal:        General: No swelling.     Cervical back: Normal range of motion and neck supple.     Comments: Left shoulder pain  Skin:    General: Skin is warm and dry.  Neurological:     General: No focal deficit present.     Mental Status: He is oriented to person, place, and time.  Psychiatric:        Mood and Affect: Mood normal.      Lab Results Lab Results  Component Value Date   WBC 8.5 10/19/2021   HGB 15.3 10/19/2021   HCT 44.9 10/19/2021   MCV 84.9 10/19/2021   PLT 195 10/19/2021    Lab Results  Component Value Date   CREATININE 0.71 10/19/2021   BUN 9 10/19/2021   NA 137 10/19/2021   K 4.1 10/19/2021   CL 100 10/19/2021   CO2 29 10/19/2021    Lab Results  Component Value Date   ALT 55 (H) 10/19/2021   AST 41 10/19/2021   ALKPHOS 92 10/19/2021   BILITOT 0.7 10/19/2021        Danelle Earthly, MD [facility] 10/19/2021, 3:18 PM

## 2021-10-19 NOTE — Progress Notes (Signed)
PROGRESS NOTE    Benjamin Thompson  EGB:[id number] DOB: Nov 01, 1970 DOA: 10/14/2021 PCP: Patient, No Pcp Per (Inactive)    Chief Complaint  Patient presents with   Abnormal Lab   Fever    Brief Narrative:   51 year old Spanish-speaking male with past medical history of hypertension, gastroesophageal reflux disease, diabetes mellitus type 2 and hyperlipidemia who presents to [facility] emergency department after being found to have positive blood cultures. -Initial suspicion for multifocal pneumonia, but further work-up was pursued given cultures are positive for gram-negative rods, aging significant for liver abscess, as well patient with significant left shoulder pain, MRI is pending.     Assessment & Plan:   Principal Problem:   Pneumonia of both lungs due to Klebsiella pneumoniae [facility]) Active Problems:   Bacteremia due to Klebsiella pneumoniae   Lactic acidosis   Uncontrolled type 2 diabetes mellitus with hyperglycemia, without long-term current use of insulin (HCC)   Essential hypertension   Mixed hyperlipidemia  Sepsis  ruled in/present on admission due to Klebsiella pneumonia bacteremia -Sepsis ruled in, with tachypnea, tachycardia, fever of 101.3 and lactic acidosis -Initial consideration is for pneumonia contributing to this, but gram-negative rods bacteremia needs to be further worked up. -initially thought to be secondary to pneumonia, but GNR bacteremia usually related to GI or GU source, so further work-up was obtained. -Right upper quadrant ultrasound with heterogeneous liver, gallbladder thickening, but HIDA scan negative for acute cholecystitis. -CT abdomen pelvis significant for liver enhancing ring lesion, likely an abscess, cussed with IR, this was confirmed by MRI of the liver, IR consult greatly appreciated, status post CT aspiration of right lobe liver abscess, initial report showing 2 males purulent fluid was aspirated, sent to GS and C&S. -Patient  with significant left shoulder tenderness extending to the left clavicle and manubrium, will proceed with MRI of left shoulder, and if it is positive for infection, then further work-up will be pursued regarding manubrium and clavicle (still awaiting left MRI shoulder to be done.  MRI protocol given he received IV contrast with liver MRI) -ID consult greatly appreciated, Rocephin has been narrowed to cefazolin.  .   Pneumonia of both lungs  -COVID-negative during recent ED visit, continue with IV Rocephin   Uncontrolled type 2 diabetes mellitus with hyperglycemia, without long-term current use of insulin (HCC) -Poorly controlled, with A1c of 13.2.  Increase his Semglee to 24 units, and continue with insulin sliding scale. -Continue to hold metformin.    Essential hypertension -Mildly on the higher side, will start on low-dose Norvasc, continue with as needed meds.  Mixed hyperlipidemia Continuing home regimen of lipid lowering therapy.  Left shoulder pain -Please see above discussion    DVT prophylaxis: Lovenox be resumed tomorrow Code Status: Full Family Communication: none at bedside, discussed at length with the patient and answered all his question via telemetry interpreter Disposition:   Status is: Inpatient      Consultants:  ID IR  Procedures -CT-guided liver abscess aspiration by IR 10/19/2021     Subjective:  Still complaining of significant left shoulder pain  Objective: Vitals:   10/19/21 0935 10/19/21 0945 10/19/21 0950 10/19/21 0955  BP: 136/81 (!) 141/80 133/86 (!) 142/82  Pulse: 91 90 90 91  Resp: 18 20 16 18   Temp:      TempSrc:      SpO2:  95% 97% 94%  Weight:      Height:        Intake/Output Summary (Last 24 hours) at  10/19/2021 1010 Last data filed at 10/19/2021 0200 Gross per 24 hour  Intake 1771.41 ml  Output 1100 ml  Net 671.41 ml   Filed Weights   10/15/21 1641  Weight: 70.1 kg    Examination:  Awake Alert, Oriented X 3, No  new F.N deficits, Normal affect, left shoulder, left midsternal and supraclavicular tenderness to palpation Symmetrical Chest wall movement, Good air movement bilaterally, CTAB RRR,No Gallops,Rubs or new Murmurs, No Parasternal Heave +ve B.Sounds, Abd Soft, No tenderness, No rebound - guarding or rigidity. No Cyanosis, Clubbing or edema, No new Rash or bruise       Data Reviewed: I have personally reviewed following labs and imaging studies  CBC: Recent Labs  Lab 10/14/21 2007 10/15/21 0510 10/15/21 2309 10/17/21 0431 10/18/21 0141 10/19/21 0235  WBC 9.7 8.5 8.9 6.8 7.7 8.5  NEUTROABS 8.8* 7.7 7.7  --   --   --   HGB 16.2 15.4 14.5 14.1 14.7 15.3  HCT 49.1 44.1 42.3 40.8 42.9 44.9  MCV 87.7 85.8 85.3 84.1 85.6 84.9  PLT 151 108* 83* 98* 142* 0000000    Basic Metabolic Panel: Recent Labs  Lab 10/13/21 1423 10/14/21 2007 10/15/21 0510 10/15/21 2309 10/17/21 0431 10/18/21 0141 10/19/21 0235  NA  --    < > 137 136 134* 133* 137  K  --    < > 3.8 3.3* 3.3* 4.5 4.1  CL  --    < > 104 104 106 104 100  CO2  --    < > 20* 26 24 25 29   GLUCOSE  --    < > 282* 192* 175* 261* 218*  BUN  --    < > 12 12 10 11 9   CREATININE  --    < > 0.87 0.80 0.72 0.72 0.71  CALCIUM  --    < > 8.0* 7.9* 7.4* 8.2* 8.4*  MG 1.8  --  1.9  --   --   --   --    < > = values in this interval not displayed.    GFR: Estimated Creatinine Clearance: 97.2 mL/min (by C-G formula based on SCr of 0.71 mg/dL).  Liver Function Tests: Recent Labs  Lab 10/15/21 0510 10/15/21 2309 10/17/21 0431 10/18/21 0141 10/19/21 0235  AST 57* 102* 70* 49* 41  ALT 53* 71* 66* 60* 55*  ALKPHOS 58 67 65 84 92  BILITOT 1.6* 0.6 0.5 0.4 0.7  PROT 6.0* 5.5* 5.3* 5.9* 6.3*  ALBUMIN 2.6* 2.4* 2.2* 2.4* 2.5*    CBG: Recent Labs  Lab 10/18/21 0615 10/18/21 1120 10/18/21 1621 10/18/21 2138 10/19/21 0520  GLUCAP 216* 257* 332* 226* 211*     Recent Results (from the past 240 hour(s))  Urine Culture     Status:  None   Collection Time: 10/13/21  2:18 PM   Specimen: In/Out Cath Urine  Result Value Ref Range Status   Specimen Description IN/OUT CATH URINE  Final   Special Requests NONE  Final   Culture   Final    NO GROWTH Performed at [facility], [place] [address], [place], [place] [address]    Report Status 10/14/2021 FINAL  Final  Blood Culture (routine x 2)     Status: Abnormal   Collection Time: 10/13/21  2:23 PM   Specimen: BLOOD  Result Value Ref Range Status   Specimen Description BLOOD BLOOD RIGHT HAND  Final   Special Requests   Final    BOTTLES  DRAWN AEROBIC AND ANAEROBIC Blood Culture results may not be optimal due to an inadequate volume of blood received in culture bottles   Culture  Setup Time   Final    GRAM NEGATIVE RODS IN BOTH AEROBIC AND ANAEROBIC BOTTLES CRITICAL RESULT CALLED TO, READ BACK BY AND VERIFIED WITH: K NEAL,RN@0500  10/14/21 [place] Performed at [facility], [address], [place], [place] [address]    Culture KLEBSIELLA PNEUMONIAE (A)  Final   Report Status 10/16/2021 FINAL  Final   Organism ID, Bacteria KLEBSIELLA PNEUMONIAE  Final      Susceptibility   Klebsiella pneumoniae - MIC*    AMPICILLIN >=32 RESISTANT Resistant     CEFAZOLIN <=4 SENSITIVE Sensitive     CEFEPIME <=0.12 SENSITIVE Sensitive     CEFTAZIDIME <=1 SENSITIVE Sensitive     CEFTRIAXONE <=0.25 SENSITIVE Sensitive     CIPROFLOXACIN <=0.25 SENSITIVE Sensitive     GENTAMICIN <=1 SENSITIVE Sensitive     IMIPENEM <=0.25 SENSITIVE Sensitive     TRIMETH/SULFA <=20 SENSITIVE Sensitive     AMPICILLIN/SULBACTAM 4 SENSITIVE Sensitive     PIP/TAZO <=4 SENSITIVE Sensitive     * KLEBSIELLA PNEUMONIAE  Blood Culture ID Panel (Reflexed)     Status: Abnormal   Collection Time: 10/13/21  2:23 PM  Result Value Ref Range Status   Enterococcus faecalis NOT DETECTED NOT DETECTED Final   Enterococcus Faecium NOT DETECTED NOT DETECTED Final   Listeria monocytogenes NOT DETECTED NOT DETECTED Final    Staphylococcus species NOT DETECTED NOT DETECTED Final   Staphylococcus aureus (BCID) NOT DETECTED NOT DETECTED Final   Staphylococcus epidermidis NOT DETECTED NOT DETECTED Final   Staphylococcus lugdunensis NOT DETECTED NOT DETECTED Final   Streptococcus species NOT DETECTED NOT DETECTED Final   Streptococcus agalactiae NOT DETECTED NOT DETECTED Final   Streptococcus pneumoniae NOT DETECTED NOT DETECTED Final   Streptococcus pyogenes NOT DETECTED NOT DETECTED Final   A.calcoaceticus-baumannii NOT DETECTED NOT DETECTED Final   Bacteroides fragilis NOT DETECTED NOT DETECTED Final   Enterobacterales DETECTED (A) NOT DETECTED Final    Comment: Enterobacterales represent a large order of gram negative bacteria, not a single organism. CRITICAL RESULT CALLED TO, READ BACK BY AND VERIFIED WITH: K NEAL,RN@0500  10/14/21 [place]    Enterobacter cloacae complex NOT DETECTED NOT DETECTED Final   Escherichia coli NOT DETECTED NOT DETECTED Final   Klebsiella aerogenes NOT DETECTED NOT DETECTED Final   Klebsiella oxytoca NOT DETECTED NOT DETECTED Final   Klebsiella pneumoniae DETECTED (A) NOT DETECTED Final    Comment: CRITICAL RESULT CALLED TO, READ BACK BY AND VERIFIED WITH: K NEAL,RN@0500  10/14/21 [place]    Proteus species NOT DETECTED NOT DETECTED Final   Salmonella species NOT DETECTED NOT DETECTED Final   Serratia marcescens NOT DETECTED NOT DETECTED Final   Haemophilus influenzae NOT DETECTED NOT DETECTED Final   Neisseria meningitidis NOT DETECTED NOT DETECTED Final   Pseudomonas aeruginosa NOT DETECTED NOT DETECTED Final   Stenotrophomonas maltophilia NOT DETECTED NOT DETECTED Final   Candida albicans NOT DETECTED NOT DETECTED Final   Candida auris NOT DETECTED NOT DETECTED Final   Candida glabrata NOT DETECTED NOT DETECTED Final   Candida krusei NOT DETECTED NOT DETECTED Final   Candida parapsilosis NOT DETECTED NOT DETECTED Final   Candida tropicalis NOT DETECTED NOT DETECTED Final    Cryptococcus neoformans/gattii NOT DETECTED NOT DETECTED Final   CTX-M ESBL NOT DETECTED NOT DETECTED Final   Carbapenem resistance IMP NOT DETECTED NOT DETECTED Final   Carbapenem  resistance KPC NOT DETECTED NOT DETECTED Final   Carbapenem resistance NDM NOT DETECTED NOT DETECTED Final   Carbapenem resist OXA 48 LIKE NOT DETECTED NOT DETECTED Final   Carbapenem resistance VIM NOT DETECTED NOT DETECTED Final    Comment: Performed at [facility], [place] [address], [place], [place] [address]  Blood Culture (routine x 2)     Status: Abnormal   Collection Time: 10/13/21  2:38 PM   Specimen: BLOOD  Result Value Ref Range Status   Specimen Description BLOOD BLOOD LEFT WRIST  Final   Special Requests   Final    BOTTLES DRAWN AEROBIC AND ANAEROBIC Blood Culture adequate volume   Culture  Setup Time   Final    GRAM NEGATIVE RODS IN BOTH AEROBIC AND ANAEROBIC BOTTLES CRITICAL VALUE NOTED.  VALUE IS CONSISTENT WITH PREVIOUSLY REPORTED AND CALLED VALUE.    Culture (A)  Final    KLEBSIELLA PNEUMONIAE SUSCEPTIBILITIES PERFORMED ON PREVIOUS CULTURE WITHIN THE LAST 5 DAYS. Performed at [facility], [place] [address] [place], [place] [address]    Report Status 10/16/2021 FINAL  Final  SARS Coronavirus 2 by RT PCR (hospital order, performed in [facility] hospital lab) *cepheid single result test* Anterior Nasal Swab     Status: None   Collection Time: 10/13/21  2:42 PM   Specimen: Anterior Nasal Swab  Result Value Ref Range Status   SARS Coronavirus 2 by RT PCR NEGATIVE NEGATIVE Final    Comment: (NOTE) SARS-CoV-2 target nucleic acids are NOT DETECTED.  The SARS-CoV-2 RNA is generally detectable in upper and lower respiratory specimens during the acute phase of infection. The lowest concentration of SARS-CoV-2 viral copies this assay can detect is 250 copies / mL. A negative result does not preclude SARS-CoV-2 infection and should not be used as the sole basis for treatment or  other patient management decisions.  A negative result may occur with improper specimen collection / handling, submission of specimen other than nasopharyngeal swab, presence of viral mutation(s) within the areas targeted by this assay, and inadequate number of viral copies (<250 copies / mL). A negative result must be combined with clinical observations, patient history, and epidemiological information.  Fact Sheet for Patients:   https://www.patel.info/  Fact Sheet for Healthcare Providers: https://hall.com/  This test is not yet approved or  cleared by the [place] FDA and has been authorized for detection and/or diagnosis of SARS-CoV-2 by FDA under an Emergency Use Authorization (EUA).  This EUA will remain in effect (meaning this test can be used) for the duration of the COVID-19 declaration under Section 564(b)(1) of the Act, 21 U.S.C. section 360bbb-3(b)(1), unless the authorization is terminated or revoked sooner.  Performed at [facility], [place] [address] [place], [place] [address]   Blood culture (routine x 2)     Status: Abnormal   Collection Time: 10/14/21 11:05 PM   Specimen: BLOOD LEFT FOREARM  Result Value Ref Range Status   Specimen Description BLOOD LEFT FOREARM  Final   Special Requests   Final    BOTTLES DRAWN AEROBIC AND ANAEROBIC Blood Culture results may not be optimal due to an excessive volume of blood received in culture bottles   Culture  Setup Time   Final    GRAM NEGATIVE RODS IN BOTH AEROBIC AND ANAEROBIC BOTTLES CRITICAL VALUE NOTED.  VALUE IS CONSISTENT WITH PREVIOUSLY REPORTED AND CALLED VALUE. CRITICAL RESULT CALLED TO, READ BACK BY AND VERIFIED WITH: PHARMD J.MELIN AT 1117  ON 10/15/2021 BY T.SAAD    Culture (A)  Final    KLEBSIELLA PNEUMONIAE SUSCEPTIBILITIES PERFORMED ON PREVIOUS CULTURE WITHIN THE LAST 5 DAYS. Performed at [facility], [place] [address] [place], [place] [address]     Report Status 10/17/2021 FINAL  Final  Blood culture (routine x 2)     Status: Abnormal   Collection Time: 10/14/21 11:08 PM   Specimen: BLOOD RIGHT HAND  Result Value Ref Range Status   Specimen Description BLOOD RIGHT HAND  Final   Special Requests   Final    BOTTLES DRAWN AEROBIC AND ANAEROBIC Blood Culture adequate volume   Culture  Setup Time   Final    GRAM NEGATIVE RODS IN BOTH AEROBIC AND ANAEROBIC BOTTLES CRITICAL VALUE NOTED.  VALUE IS CONSISTENT WITH PREVIOUSLY REPORTED AND CALLED VALUE.    Culture (A)  Final    KLEBSIELLA PNEUMONIAE SUSCEPTIBILITIES PERFORMED ON PREVIOUS CULTURE WITHIN THE LAST 5 DAYS. Performed at [facility], [place] [address] [place], [place] [address]    Report Status 10/17/2021 FINAL  Final         Radiology Studies: MR LIVER W WO CONTRAST  Result Date: 10/18/2021 CLINICAL DATA:  51 year old male with history of bacteremia. Suspected liver abscess. EXAM: MRI ABDOMEN WITHOUT AND WITH CONTRAST TECHNIQUE: Multiplanar multisequence MR imaging of the abdomen was performed both before and after the administration of intravenous contrast. CONTRAST:  19mL GADAVIST GADOBUTROL 1 MMOL/ML IV SOLN COMPARISON:  No prior abdominal MRI. CT the abdomen and pelvis 10/17/2021. Abdominal ultrasound 10/16/2021. FINDINGS: Lower chest: Small bilateral pleural effusions lying dependently. Hepatobiliary: Diffuse loss of signal intensity throughout the hepatic parenchyma on out of phase dual echo images, indicative of a background of hepatic steatosis. In segment 7 of the liver (axial image 42 of series 20 and coronal image 29 of series 24) there is a 2.3 x 2.0 x 2.0 cm lesion which is centrally mildly T1 hypointense, T2 hyperintense, with some ill-defined surrounding T2 hyperintensity and peripheral rim of enhancement on post gadolinium imaging. This lesion mildly restricts diffusion as well. No other suspicious hepatic lesions are noted. No intra or extrahepatic biliary  ductal dilatation. Multiple small filling defects are noted within the lumen of the gallbladder, compatible with gallstones. Gallbladder is only mildly distended. Gallbladder wall thickness is normal. No pericholecystic fluid, although there is a small amount of fluid in the region of the porta hepatis, tracking caudally adjacent to the second portion of the duodenum. Common bile duct measures 4 mm in the porta hepatis. Pancreas: No pancreatic mass. No pancreatic ductal dilatation. No pancreatic or peripancreatic fluid collections or inflammatory changes. Spleen:  Unremarkable. Adrenals/Urinary Tract: In the medial aspect of the upper pole of the left kidney there is a 2.7 cm T1 hypointense, T2 hyperintense lesion, compatible with a simple (Bosniak class 1) cysts. Several other simple appearing subcentimeter cysts are also noted elsewhere in the kidneys bilaterally (no imaging follow-up is recommended). No aggressive appearing renal lesions. No hydroureteronephrosis in the visualized portions of the abdomen. Bilateral adrenal glands are normal in appearance. Stomach/Bowel: Visualized portions are unremarkable. Vascular/Lymphatic: No aneurysm identified in the visualized abdominal vasculature. Circumaortic left renal vein (normal anatomical variant) incidentally noted. No lymphadenopathy noted in the abdomen. Other: No significant volume of ascites noted in the visualized portions of the peritoneal cavity. Musculoskeletal: No aggressive appearing osseous lesions are noted in the visualized portions of the skeleton. IMPRESSION: 1. 2.3 x 2.0 x 2.0 cm lesion in segment 7  of the liver has imaging characteristics compatible with an intrahepatic abscess. 2. Cholelithiasis. No definitive imaging findings to suggest acute cholecystitis are noted at this time. There is a trace volume of fluid in the porta hepatis tracking caudally adjacent to the second portion of the duodenum. Given the absence of other overt findings of  acute cholecystitis, this is favored to be reactive free fluid related to the hepatic abscess at this time. 3. Hepatic steatosis. 4. Small bilateral pleural effusions lying dependently. 5. Additional incidental findings, as above. Electronically Signed   By: Vinnie Langton M.D.   On: 10/18/2021 08:33        Scheduled Meds:  [START ON 10/20/2021] enoxaparin (LOVENOX) injection  40 mg Subcutaneous Q24H   feeding supplement  237 mL Oral BID BM   insulin aspart  0-15 Units Subcutaneous TID AC & HS   insulin aspart  5 Units Subcutaneous TID WC   insulin glargine-yfgn  24 Units Subcutaneous Daily   multivitamin with minerals  1 tablet Oral Daily   Continuous Infusions:  sodium chloride Stopped (10/15/21 0601)   sodium chloride 50 mL/hr at 10/18/21 1900    ceFAZolin (ANCEF) IV 2 g (10/19/21 0607)     LOS: 4 days       Phillips Climes, MD [facility]   To contact the attending provider between 7A-7P or the covering provider during after hours 7P-7A, please log into the web site [email or web address] and access using universal [place] password for that web site. If you do not have the password, please call the hospital operator.  10/19/2021, 10:10 AM

## 2021-10-20 ENCOUNTER — Other Ambulatory Visit (HOSPITAL_COMMUNITY): Payer: Self-pay

## 2021-10-20 ENCOUNTER — Inpatient Hospital Stay (HOSPITAL_COMMUNITY): Payer: Self-pay

## 2021-10-20 LAB — GLUCOSE, CAPILLARY
Glucose-Capillary: 308 mg/dL — ABNORMAL HIGH (ref 70–99)
Glucose-Capillary: 355 mg/dL — ABNORMAL HIGH (ref 70–99)

## 2021-10-20 LAB — COMPREHENSIVE METABOLIC PANEL
ALT: 50 U/L — ABNORMAL HIGH (ref 0–44)
AST: 41 U/L (ref 15–41)
Albumin: 2.5 g/dL — ABNORMAL LOW (ref 3.5–5.0)
Alkaline Phosphatase: 98 U/L (ref 38–126)
Anion gap: 7 (ref 5–15)
BUN: 12 mg/dL (ref 6–20)
CO2: 25 mmol/L (ref 22–32)
Calcium: 8.6 mg/dL — ABNORMAL LOW (ref 8.9–10.3)
Chloride: 101 mmol/L (ref 98–111)
Creatinine, Ser: 0.7 mg/dL (ref 0.61–1.24)
GFR, Estimated: >60 mL/min (ref >60)
Glucose, Bld: 265 mg/dL — ABNORMAL HIGH (ref 70–99)
Potassium: 4.1 mmol/L (ref 3.5–5.1)
Sodium: 133 mmol/L — ABNORMAL LOW (ref 135–145)
Total Bilirubin: 0.6 mg/dL (ref 0.3–1.2)
Total Protein: 6.3 g/dL — ABNORMAL LOW (ref 6.5–8.1)

## 2021-10-20 LAB — CBC
HCT: 43.8 % (ref 39.0–52.0)
Hemoglobin: 14.3 g/dL (ref 13.0–17.0)
MCH: 28.3 pg (ref 26.0–34.0)
MCHC: 32.6 g/dL (ref 30.0–36.0)
MCV: 86.7 fL (ref 80.0–100.0)
Platelets: 258 10*3/uL (ref 150–400)
RBC: 5.05 MIL/uL (ref 4.22–5.81)
RDW: 13.7 % (ref 11.5–15.5)
WBC: 8.5 10*3/uL (ref 4.0–10.5)
nRBC: 0 % (ref 0.0–0.2)

## 2021-10-20 MED ORDER — DICLOFENAC SODIUM 1 % EX GEL
4.0000 g | Freq: Four times a day (QID) | CUTANEOUS | Status: DC
  Filled 2021-10-20: qty 100

## 2021-10-20 MED ORDER — INSULIN PEN NEEDLE 32G X 4 MM MISC
0 refills | Status: DC
  Filled 2021-10-20: qty 100, 30d supply, fill #0

## 2021-10-20 MED ORDER — METFORMIN HCL 500 MG PO TABS
500.0000 mg | ORAL_TABLET | Freq: Two times a day (BID) | ORAL | 0 refills | Status: DC
  Filled 2021-10-20: qty 60, 30d supply, fill #0

## 2021-10-20 MED ORDER — ATORVASTATIN CALCIUM 20 MG PO TABS
20.0000 mg | ORAL_TABLET | Freq: Every day | ORAL | 0 refills | Status: DC
  Filled 2021-10-20: qty 30, 30d supply, fill #0

## 2021-10-20 MED ORDER — GLUCOSE BLOOD VI STRP
ORAL_STRIP | 0 refills | Status: DC
  Filled 2021-10-20: qty 100, 30d supply, fill #0

## 2021-10-20 MED ORDER — ACETAMINOPHEN 325 MG PO TABS: 650.0000 mg | ORAL_TABLET | Freq: Four times a day (QID) | ORAL | Status: DC | PRN

## 2021-10-20 MED ORDER — CEFADROXIL 500 MG PO CAPS
1000.0000 mg | ORAL_CAPSULE | Freq: Two times a day (BID) | ORAL | Status: DC
  Administered 2021-10-20: 1000 mg via ORAL
  Filled 2021-10-20 (×2): qty 2

## 2021-10-20 MED ORDER — CEFADROXIL 500 MG PO CAPS
1000.0000 mg | ORAL_CAPSULE | Freq: Two times a day (BID) | ORAL | 0 refills | Status: DC
  Filled 2021-10-20: qty 104, 26d supply, fill #0

## 2021-10-20 MED ORDER — INSULIN GLARGINE-YFGN 100 UNIT/ML ~~LOC~~ SOPN
40.0000 [IU] | PEN_INJECTOR | Freq: Every day | SUBCUTANEOUS | 5 refills | Status: DC
  Filled 2021-10-20: qty 12, 30d supply, fill #0

## 2021-10-20 MED ORDER — ACCU-CHEK SOFTCLIX LANCETS MISC
0 refills | Status: DC
  Filled 2021-10-20: qty 100, 30d supply, fill #0

## 2021-10-20 MED ORDER — GADOBUTROL 1 MMOL/ML IV SOLN
7.0000 mL | Freq: Once | INTRAVENOUS | Status: AC | PRN
  Administered 2021-10-20: 7 mL via INTRAVENOUS

## 2021-10-20 MED ORDER — METRONIDAZOLE 500 MG PO TABS
500.0000 mg | ORAL_TABLET | Freq: Two times a day (BID) | ORAL | 0 refills | Status: DC
  Filled 2021-10-20: qty 52, 26d supply, fill #0

## 2021-10-20 MED ORDER — AMLODIPINE BESYLATE 5 MG PO TABS
5.0000 mg | ORAL_TABLET | Freq: Every day | ORAL | Status: DC
  Administered 2021-10-20: 5 mg via ORAL
  Filled 2021-10-20: qty 1

## 2021-10-20 MED ORDER — ACCU-CHEK GUIDE W/DEVICE KIT
PACK | 0 refills | Status: DC
  Filled 2021-10-20: qty 1, 30d supply, fill #0

## 2021-10-20 MED ORDER — METHOCARBAMOL 500 MG PO TABS
500.0000 mg | ORAL_TABLET | Freq: Four times a day (QID) | ORAL | 0 refills | Status: DC | PRN
  Filled 2021-10-20: qty 20, 5d supply, fill #0

## 2021-10-20 MED ORDER — DICLOFENAC SODIUM 1 % EX GEL
4.0000 g | Freq: Four times a day (QID) | CUTANEOUS | 0 refills | Status: DC
  Filled 2021-10-20: qty 100, 7d supply, fill #0

## 2021-10-20 MED ORDER — INSULIN GLARGINE-YFGN 100 UNIT/ML ~~LOC~~ SOLN
35.0000 [IU] | Freq: Every day | SUBCUTANEOUS | Status: DC
  Administered 2021-10-20: 35 [IU] via SUBCUTANEOUS
  Filled 2021-10-20: qty 0.35

## 2021-10-20 MED ORDER — AMLODIPINE BESYLATE 5 MG PO TABS
5.0000 mg | ORAL_TABLET | Freq: Every day | ORAL | 0 refills | Status: DC
  Filled 2021-10-20: qty 30, 30d supply, fill #0

## 2021-10-20 MED ORDER — METRONIDAZOLE 500 MG PO TABS
500.0000 mg | ORAL_TABLET | Freq: Two times a day (BID) | ORAL | Status: DC
  Administered 2021-10-20: 500 mg via ORAL
  Filled 2021-10-20: qty 1

## 2021-10-20 NOTE — Progress Notes (Addendum)
Order received to discharge patient.  Telemetry monitor removed and CCMD notified.  PIV access removed.  Work note was given to patient.  Discharge instructions, follow up, medications and instructions for their use discussed with patient.

## 2021-10-20 NOTE — Progress Notes (Signed)
Inpatient Diabetes Program Recommendations  AACE/ADA: New Consensus Statement on Inpatient Glycemic Control (2015)  Target Ranges:  Prepandial:   less than 140 mg/dL      Peak postprandial:   less than 180 mg/dL (1-2 hours)      Critically ill patients:  140 - 180 mg/dL   Lab Results  Component Value Date   GLUCAP 308 (H) 10/20/2021   HGBA1C 13.2 (H) 10/15/2021    Review of Glycemic Control  Latest Reference Range & Units 10/19/21 05:20 10/19/21 11:58 10/19/21 17:02 10/19/21 21:00 10/20/21 05:38  Glucose-Capillary 70 - 99 mg/dL 211 (H) 213 (H) 278 (H) 247 (H) 308 (H)  (H): Data is abnormally high  Diabetes history: DM2 Outpatient Diabetes medications: Metformin 500 mg BID Current orders for Inpatient glycemic control: Semglee 35 units daily, Novolog 0-15 units AC&HS,  Novolog 5 units TID with meals  Inpatient Diabetes Program Recommendations:   -Add Novolog 0-5 units hs correction -Increase Novolog meal coverage to 6 units tid if eats 50% Secure chat to Dr. [facility].  Thank you, Benjamin Thompson. Benjamin Kise, RN, MSN, CDE  Diabetes Coordinator Inpatient Glycemic Control Team Team Pager [phone] (8am-5pm) 10/20/2021 7:57 AM

## 2021-10-20 NOTE — Discharge Instructions (Signed)
[facility]  Get CBC, CMP,  checked  by Primary MD next visit.    Disposition Home    Diet: Carb modified diet  On your next visit with your primary care physician please Get Medicines reviewed and adjusted.   Please request your Prim.MD to go over all Hospital Tests and Procedure/Radiological results at the follow up, please get all Hospital records sent to your Prim MD by signing hospital release before you go home.   If you experience worsening of your admission symptoms, develop shortness of breath, life threatening emergency, suicidal or homicidal thoughts you must seek medical attention immediately by calling 911 or calling your MD immediately  if symptoms less severe.  You Must read complete instructions/literature along with all the possible adverse reactions/side effects for all the Medicines you take and that have been prescribed to you. Take any new Medicines after you have completely understood and accpet all the possible adverse reactions/side effects.   Do not drive, operating heavy machinery, perform activities at heights, swimming or participation in water activities or provide baby sitting services if your were admitted for syncope or siezures until you have seen by Primary MD or a Neurologist and advised to do so again.  Do not drive when taking Pain medications.    Do not take more than prescribed Pain, Sleep and Anxiety Medications  Special Instructions: If you have smoked or chewed Tobacco  in the last 2 yrs please stop smoking, stop any regular Alcohol  and or any Recreational drug use.  Wear Seat belts while driving.   Please note  You were cared for by a hospitalist during your hospital stay. If you have any questions about your discharge medications or the care you received while you were in the hospital after you are discharged, you can call the unit and asked to speak with the hospitalist on call if the hospitalist that  took care of you is not available. Once you are discharged, your primary care physician will handle any further medical issues. Please note that NO REFILLS for any discharge medications will be authorized once you are discharged, as it is imperative that you return to your primary care physician (or establish a relationship with a primary care physician if you do not have one) for your aftercare needs so that they can reassess your need for medications and monitor your lab values.

## 2021-10-20 NOTE — Discharge Summary (Signed)
Physician Discharge Summary  Benjamin Thompson VUY:[id number] DOB: 06-Jun-1970 DOA: 10/14/2021  PCP: Patient, No Pcp Per (Inactive)  Admit date: 10/14/2021 Discharge date: 10/20/2021  Admitted From: Home Disposition:  Home  Recommendations for Outpatient Follow-up:  Follow up with PCP/[facility]  Please obtain BMP/CBC adjust diabetic regimen as needed  Home Health:NO  Discharge Condition:Stable CODE STATUS:FULL Diet recommendation: Carb Modified   Brief/Interim Summary:  51 year old Spanish-speaking male with past medical history of hypertension, gastroesophageal reflux disease, diabetes mellitus type 2 and hyperlipidemia who presents to [facility] emergency department after being found to have positive blood cultures. -Initial suspicion for multifocal pneumonia, but further work-up was pursued given cultures are positive for gram-negative rods, aging significant for liver abscess, as well patient with significant left shoulder pain, MRI of his shoulder with no evidence of infection, but significant for tendinosis.    Discharge Diagnoses:  Principal Problem:   Pneumonia of both lungs due to Klebsiella pneumoniae [facility]) Active Problems:   Bacteremia due to Klebsiella pneumoniae   Lactic acidosis   Uncontrolled type 2 diabetes mellitus with hyperglycemia, without long-term current use of insulin (HCC)   Essential hypertension   Mixed hyperlipidemia   Liver abscess  Sepsis  ruled in/present on admission due to Klebsiella pneumonia bacteremia/liver abscess  -Sepsis ruled in, with tachypnea, tachycardia, fever of 101.3 and lactic acidosis -Work-up significant for liver abscess, status post ultrasound-guided biopsy by IR 6/5, initial results showing gram-negative rods. -Right upper quadrant ultrasound with heterogeneous liver, gallbladder thickening, but HIDA scan negative for acute cholecystitis. -Blood culture growing Klebsiella pneumonia, admitted initially with IV  Rocephin, then narrowed to cefazolin, he will be discharged on cefadroxil and Flagy, will need 4 weeks of antibiotic treatment from aspiration on 6/5, with OT 11/15/2021. -ID input greatly appreciated, follow-up with ID clinic arranged with Dr. Clemens Thompson, on 6/13  Left Shoulder pain/tendinosis -Patient complaining of significant left shoulder pain, there was a concern about septic arthritis, but MRI of left shoulder significant for tendinosis, no evidence of fluid collection or septic arthritis   Pneumonia of both lungs  -COVID-negative during recent ED visit, please see above discussion   Uncontrolled type 2 diabetes mellitus with hyperglycemia, without long-term current use of insulin ([place]) -Poorly controlled, with A1c of 13.2.  He was started on Semglee, uptitrated to 40 units at time of discharge, continue with metformin, follow-up arrangement has been made with [facility] on 11/16/2021.    Essential hypertension -Mildly on the higher side, started on low-dose Norvasc   Mixed hyperlipidemia Started on low-dose Lipitor especially he is diabetic   Left shoulder pain -Please see above discussion    Discharge Instructions  Discharge Instructions     Diet - low sodium heart healthy   Complete by: As directed    Discharge instructions   Complete by: As directed    Follow with [facility]  Get CBC, CMP,  checked  by Primary MD next visit.    Disposition Home    Diet: Carb modified diet  On your next visit with your primary care physician please Get Medicines reviewed and adjusted.   Please request your Prim.MD to go over all Hospital Tests and Procedure/Radiological results at the follow up, please get all Hospital records sent to your Prim MD by signing hospital release before you go home.   If you experience worsening of your admission symptoms, develop shortness of breath, life threatening emergency, suicidal or homicidal thoughts you must  seek  medical attention immediately by calling 911 or calling your MD immediately  if symptoms less severe.  You Must read complete instructions/literature along with all the possible adverse reactions/side effects for all the Medicines you take and that have been prescribed to you. Take any new Medicines after you have completely understood and accpet all the possible adverse reactions/side effects.   Do not drive, operating heavy machinery, perform activities at heights, swimming or participation in water activities or provide baby sitting services if your were admitted for syncope or siezures until you have seen by Primary MD or a Neurologist and advised to do so again.  Do not drive when taking Pain medications.    Do not take more than prescribed Pain, Sleep and Anxiety Medications  Special Instructions: If you have smoked or chewed Tobacco  in the last 2 yrs please stop smoking, stop any regular Alcohol  and or any Recreational drug use.  Wear Seat belts while driving.   Please note  You were cared for by a hospitalist during your hospital stay. If you have any questions about your discharge medications or the care you received while you were in the hospital after you are discharged, you can call the unit and asked to speak with the hospitalist on call if the hospitalist that took care of you is not available. Once you are discharged, your primary care physician will handle any further medical issues. Please note that NO REFILLS for any discharge medications will be authorized once you are discharged, as it is imperative that you return to your primary care physician (or establish a relationship with a primary care physician if you do not have one) for your aftercare needs so that they can reassess your need for medications and monitor your lab values.   Increase activity slowly   Complete by: As directed    No wound care   Complete by: As directed       Allergies as of 10/20/2021   No  Known Allergies      Medication List     STOP taking these medications    cyclobenzaprine 5 MG tablet Commonly known as: FLEXERIL   diclofenac 50 MG tablet Commonly known as: Cataflam       TAKE these medications    Accu-Chek Guide w/Device Kit Use como se indica. (Use As Directed)   Accu-Chek Softclix Lancets lancets Use como se indica. (Use As Directed)   acetaminophen 325 MG tablet Commonly known as: TYLENOL Take 2 tablets (650 mg total) by mouth every 6 (six) hours as needed for mild pain or moderate pain (or Fever >/= 101).   amLODipine 5 MG tablet Commonly known as: NORVASC Take 1 tablet (5 mg total) by mouth daily.   atorvastatin 20 MG tablet Commonly known as: Lipitor Take 1 tablet (20 mg total) by mouth daily.   cefadroxil 500 MG capsule Commonly known as: DURICEF Take 2 capsules (1,000 mg total) by mouth 2 (two) times daily for 26 days.   diclofenac Sodium 1 % Gel Commonly known as: VOLTAREN Apply 4 g topically 4 (four) times daily.   glucose blood test strip Use como se indica. (Use As Directed)   insulin glargine-yfgn 100 UNIT/ML Pen Commonly known as: SEMGLEE Inject 40 Units into the skin daily.   Insulin Pen Needle 32G X 4 MM Misc Use como se indica. (Use As Directed)   metFORMIN 500 MG tablet Commonly known as: GLUCOPHAGE Take 1 tablet (500 mg total) by mouth 2 (two)  times daily with a meal.   methocarbamol 500 MG tablet Commonly known as: ROBAXIN Take 1 tablet (500 mg total) by mouth every 6 (six) hours as needed for muscle spasms.   metroNIDAZOLE 500 MG tablet Commonly known as: FLAGYL Take 1 tablet (500 mg total) by mouth every 12 (twelve) hours for 26 days.         Follow-up Information     [facility] Go on 11/16/2021.   Why: July 3rd at 230 pm Dr Margarita Rana. for post hospital follow up. Contact information: [place] [address] [phone]         Laurice Record, MD Follow up on 11/02/2021.   Specialty: Infectious Diseases Why: Appointment is at 11:15 AM, please be 30 minutes early. Contact information: [address] [place] [address] [phone]                No Known Allergies  Consultations: ID IR    Procedures/Studies: CT Angio Chest PE W and/or Wo Contrast  Result Date: 10/15/2021 CLINICAL DATA:  Fever and tachycardia. EXAM: CT ANGIOGRAPHY CHEST WITH CONTRAST TECHNIQUE: Multidetector CT imaging of the chest was performed using the standard protocol during bolus administration of intravenous contrast. Multiplanar CT image reconstructions and MIPs were obtained to evaluate the vascular anatomy. RADIATION DOSE REDUCTION: This exam was performed according to the departmental dose-optimization program which includes automated exposure control, adjustment of the mA and/or kV according to patient size and/or use of iterative reconstruction technique. CONTRAST:  19m OMNIPAQUE IOHEXOL 350 MG/ML SOLN COMPARISON:  Chest x-ray 10/13/2020. FINDINGS: Cardiovascular: Satisfactory opacification of the pulmonary arteries to the segmental level. No evidence of pulmonary embolism. Normal heart size. No pericardial effusion. Mediastinum/Nodes: No enlarged mediastinal, hilar, or axillary lymph nodes. Thyroid gland, trachea, and esophagus demonstrate no significant findings. Lungs/Pleura: There are patchy multifocal ground-glass and linear opacities throughout both lungs. These are seen predominantly peripherally. There is no pleural effusion or pneumothorax identified. There is a calcified granuloma in the left upper lobe. Trachea and central airways are patent. Upper Abdomen: No acute abnormality. Musculoskeletal: No chest wall abnormality. No acute or significant osseous findings. Review of the MIP images confirms the above findings. IMPRESSION: 1. No evidence for pulmonary embolism. 2. Multifocal predominantly  peripheral ground-glass and linear opacities throughout both lungs most compatible with infection/inflammation. COVID pneumonia can have this appearance. Please correlate clinically. Electronically Signed   By: ARonney AstersM.D.   On: 10/15/2021 01:03   NM Hepatobiliary Liver Func  Result Date: 10/16/2021 CLINICAL DATA:  Bacteremia. EXAM: NUCLEAR MEDICINE HEPATOBILIARY IMAGING TECHNIQUE: Sequential images of the abdomen were obtained out to 60 minutes following intravenous administration of radiopharmaceutical. RADIOPHARMACEUTICALS:  5 mCi Tc-957mCholetec IV COMPARISON:  None Available. FINDINGS: Prompt uptake and biliary excretion of activity by the liver is seen. Gallbladder activity is visualized, consistent with patency of cystic duct. Biliary activity passes into small bowel, consistent with patent common bile duct. IMPRESSION: Normal examination. Electronically Signed   By: AmRonney Asters.D.   On: 10/16/2021 15:26   CT ABDOMEN PELVIS W CONTRAST  Result Date: 10/17/2021 CLINICAL DATA:  Gram-negative bacteremia. Abnormal appearance of liver on recent ultrasound. EXAM: CT ABDOMEN AND PELVIS WITH CONTRAST TECHNIQUE: Multidetector CT imaging of the abdomen and pelvis was performed using the standard protocol following bolus administration of intravenous contrast. RADIATION DOSE REDUCTION: This exam was performed according to the departmental dose-optimization program which includes automated  exposure control, adjustment of the mA and/or kV according to patient size and/or use of iterative reconstruction technique. CONTRAST:  133m OMNIPAQUE IOHEXOL 300 MG/ML  SOLN COMPARISON:  Ultrasound on 10/16/2021 FINDINGS: Lower Chest: Bibasilar atelectasis and tiny bilateral pleural effusions. Hepatobiliary: Low-attenuation lesion with thin irregular enhancing rim is seen in the posterior right hepatic lobe measuring 2.9 x 2.6 cm. This is suspicious for abscess, with neoplasm considered less likely. No other liver  lesions identified. Tiny sub-cm calcified gallstone noted, however there is no evidence of cholecystitis or biliary ductal dilatation. Pancreas:  No mass or inflammatory changes. Spleen: Within normal limits in size and appearance. Adrenals/Urinary Tract: No masses identified. Benign-appearing left renal cysts noted (no followup imaging recommended). No evidence of ureteral calculi or hydronephrosis. Unremarkable unopacified urinary bladder. Stomach/Bowel: No evidence of obstruction, inflammatory process or abnormal fluid collections. Tiny amount of fluid noted in right paracolic gutter. Vascular/Lymphatic: No pathologically enlarged lymph nodes. No acute vascular findings. Reproductive: Unremarkable appearance of prostate and seminal vesicles. No mass or other significant abnormality. Other:  None. Musculoskeletal:  No suspicious bone lesions identified. IMPRESSION: 2.9 cm rim enhancing lesion in posterior right hepatic lobe, suspicious for abscess, with neoplasm considered less likely. Cholelithiasis. No radiographic evidence of cholecystitis. Bibasilar atelectasis and tiny bilateral pleural effusions. Electronically Signed   By: JMarlaine HindM.D.   On: 10/17/2021 09:19   MR LIVER W WO CONTRAST  Result Date: 10/18/2021 CLINICAL DATA:  51year old male with history of bacteremia. Suspected liver abscess. EXAM: MRI ABDOMEN WITHOUT AND WITH CONTRAST TECHNIQUE: Multiplanar multisequence MR imaging of the abdomen was performed both before and after the administration of intravenous contrast. CONTRAST:  769mGADAVIST GADOBUTROL 1 MMOL/ML IV SOLN COMPARISON:  No prior abdominal MRI. CT the abdomen and pelvis 10/17/2021. Abdominal ultrasound 10/16/2021. FINDINGS: Lower chest: Small bilateral pleural effusions lying dependently. Hepatobiliary: Diffuse loss of signal intensity throughout the hepatic parenchyma on out of phase dual echo images, indicative of a background of hepatic steatosis. In segment 7 of the liver  (axial image 42 of series 20 and coronal image 29 of series 24) there is a 2.3 x 2.0 x 2.0 cm lesion which is centrally mildly T1 hypointense, T2 hyperintense, with some ill-defined surrounding T2 hyperintensity and peripheral rim of enhancement on post gadolinium imaging. This lesion mildly restricts diffusion as well. No other suspicious hepatic lesions are noted. No intra or extrahepatic biliary ductal dilatation. Multiple small filling defects are noted within the lumen of the gallbladder, compatible with gallstones. Gallbladder is only mildly distended. Gallbladder wall thickness is normal. No pericholecystic fluid, although there is a small amount of fluid in the region of the porta hepatis, tracking caudally adjacent to the second portion of the duodenum. Common bile duct measures 4 mm in the porta hepatis. Pancreas: No pancreatic mass. No pancreatic ductal dilatation. No pancreatic or peripancreatic fluid collections or inflammatory changes. Spleen:  Unremarkable. Adrenals/Urinary Tract: In the medial aspect of the upper pole of the left kidney there is a 2.7 cm T1 hypointense, T2 hyperintense lesion, compatible with a simple (Bosniak class 1) cysts. Several other simple appearing subcentimeter cysts are also noted elsewhere in the kidneys bilaterally (no imaging follow-up is recommended). No aggressive appearing renal lesions. No hydroureteronephrosis in the visualized portions of the abdomen. Bilateral adrenal glands are normal in appearance. Stomach/Bowel: Visualized portions are unremarkable. Vascular/Lymphatic: No aneurysm identified in the visualized abdominal vasculature. Circumaortic left renal vein (normal anatomical variant) incidentally noted. No lymphadenopathy noted in the abdomen. Other:  No significant volume of ascites noted in the visualized portions of the peritoneal cavity. Musculoskeletal: No aggressive appearing osseous lesions are noted in the visualized portions of the skeleton.  IMPRESSION: 1. 2.3 x 2.0 x 2.0 cm lesion in segment 7 of the liver has imaging characteristics compatible with an intrahepatic abscess. 2. Cholelithiasis. No definitive imaging findings to suggest acute cholecystitis are noted at this time. There is a trace volume of fluid in the porta hepatis tracking caudally adjacent to the second portion of the duodenum. Given the absence of other overt findings of acute cholecystitis, this is favored to be reactive free fluid related to the hepatic abscess at this time. 3. Hepatic steatosis. 4. Small bilateral pleural effusions lying dependently. 5. Additional incidental findings, as above. Electronically Signed   By: Vinnie Langton M.D.   On: 10/18/2021 08:33   MR SHOULDER LEFT W WO CONTRAST  Result Date: 10/20/2021 CLINICAL DATA:  Left shoulder pain, bacteremia EXAM: MRI OF THE LEFT SHOULDER WITHOUT AND WITH CONTRAST TECHNIQUE: Multiplanar, multisequence MR imaging of the left shoulder was performed before and after the administration of intravenous contrast. CONTRAST:  27m GADAVIST GADOBUTROL 1 MMOL/ML IV SOLN COMPARISON:  X-ray 10/16/2021 FINDINGS: Rotator cuff: Mild tendinosis of the supraspinatus, infraspinatus, and subscapularis tendons without tear. Teres minor intact. Muscles: Preserved bulk and signal intensity of the rotator cuff musculature without edema, atrophy, or fatty infiltration. Biceps long head: Intra-articular biceps tendinosis. No tenosynovitis. Acromioclavicular Joint: No significant degenerative changes of the AC joint. Mild subacromial-subdeltoid bursal edema without significant fluid accumulation. Glenohumeral Joint: Physiologic amount of joint fluid without effusion. No enhancing synovitis. No cartilage defect. Labrum:  No evidence of labral tear. No paralabral cyst. Bones: No acute fracture. No dislocation. No bone marrow edema. No erosion. No periostitis no marrow replacing bone lesion. Other: Mild soft tissue edema seen within the  supraclavicular region at the edge of the field of view, nonspecific. No fluid collections are seen. IMPRESSION: 1. No evidence of septic arthritis or osteomyelitis involving the left shoulder. 2. Mild soft tissue edema within the supraclavicular region at the edge of the field of view, nonspecific. No fluid collections are seen. 3. Mild rotator cuff tendinosis without tear. 4. Intra-articular biceps tendinosis without tenosynovitis. Electronically Signed   By: NDavina PokeD.O.   On: 10/20/2021 08:32   CT ASPIRATION  Result Date: 10/19/2021 CLINICAL DATA:  Gram-negative bacteremia, enhancing 2.3 cm right hepatic lesion on CT, suspicious for abscess on MRI. EXAM: CT GUIDED ASPIRATION OF RIGHT LIVER LESION ANESTHESIA/SEDATION: Intravenous Fentanyl 5347m and Versed 47m69mere administered as conscious sedation during continuous monitoring of the patient's level of consciousness and physiological / cardiorespiratory status by the radiology RN, with a total moderate sedation time of 10 minutes. PROCEDURE: The procedure risks, benefits, and alternatives were explained to the patient. Questions regarding the procedure were encouraged and answered. The patient understands and consents to the procedure. Select axial scans through the liver were obtained. The lesion was localized and an appropriate skin entry site was determined and marked. The operative field was prepped with chlorhexidinein a sterile fashion, and a sterile drape was applied covering the operative field. A sterile gown and sterile gloves were used for the procedure. Local anesthesia was provided with 1% Lidocaine. Under CT fluoroscopic guidance, 18 gauge trocar needle advanced to the collection. 2 mL purulent aspirate were removed, sent for Gram stain and culture. Postprocedure scans show decrease in size of the collection. Negative for hemorrhage or other apparent complication. The  patient tolerated the procedure well. RADIATION DOSE REDUCTION: This  exam was performed according to the departmental dose-optimization program which includes automated exposure control, adjustment of the mA and/or kV according to patient size and/or use of iterative reconstruction technique. COMPLICATIONS: None immediate FINDINGS: Low-attenuation hepatic segment 7 lesion was localized corresponding to MR findings. Aspiration returned 2 mL purulent material. No evidence of postprocedure hemorrhage or other complication. IMPRESSION: 1. Technically successful CT-guided aspiration, right hepatic lesion, sent for Gram stain and culture. Electronically Signed   By: Lucrezia Europe M.D.   On: 10/19/2021 12:13   DG Chest Port 1 View  Result Date: 10/13/2021 CLINICAL DATA:  Sepsis, syncope. EXAM: PORTABLE CHEST 1 VIEW COMPARISON:  None Available. FINDINGS: The heart size and mediastinal contours are within normal limits. Right lung is clear. Minimal left basilar subsegmental atelectasis or infiltrate is noted. The visualized skeletal structures are unremarkable. IMPRESSION: Minimal left basilar subsegmental atelectasis or infiltrate is noted. Electronically Signed   By: Marijo Conception M.D.   On: 10/13/2021 15:04   DG Shoulder Left  Result Date: 10/16/2021 CLINICAL DATA:  Acute left shoulder pain without known injury. EXAM: LEFT SHOULDER - 2+ VIEW COMPARISON:  None Available. FINDINGS: There is no evidence of fracture or dislocation. There is no evidence of arthropathy or other focal bone abnormality. Soft tissues are unremarkable. IMPRESSION: Negative. Electronically Signed   By: Marijo Conception M.D.   On: 10/16/2021 14:52   US Abdomen Limited RUQ (LIVER/GB)  Result Date: 10/16/2021 CLINICAL DATA:  51 year old male with gram negative bacteremia. Query biliary source. EXAM: ULTRASOUND ABDOMEN LIMITED RIGHT UPPER QUADRANT COMPARISON:  CTA chest 10/15/2021. FINDINGS: Gallbladder: Irregular gallbladder wall thickening, proximally 6 mm with evidence of wall edema and/or pericholecystic  fluid (images 3 and 4). Tumefactive sludge suspected within the gallbladder neck on image 13, nonshadowing. No convincing gallstones, but multiple foci of either adherent echogenic sludge to the wall, or small gallbladder polyps (up to 6 mm images 16 and 17-benign, no follow-up imaging recommended. this recommendation follows ACR consensus guidelines: White Paper of the ACR Incidental Findings Committee II on Gallbladder and Biliary Findings. J Am Coll Radiol 2013:;10:953-956.). No sonographic Murphy sign elicited. Common bile duct: Diameter: 4 mm, normal. Liver: Heterogeneous liver parenchyma, with geographic areas of hyper/hypo echo echo echotexture (see image 37). No discrete liver abscess. No intrahepatic biliary ductal dilatation. Portal vein is patent on color Doppler imaging with normal direction of blood flow towards the liver. Other: Negative visible right kidney.  No free fluid. IMPRESSION: 1. Abnormal gallbladder with wall thickening/edema. Sludge within the lumen, although no gallstones identified. Acute Cholecystitis is difficult to exclude. 2. Furthermore, liver parenchyma appears heterogeneous. No discrete liver mass or abscess, but recommend further characterization with either Liver protocol Abdomen CT, MRI when feasible. 3. No evidence of bile duct obstruction. Electronically Signed   By: Genevie Ann M.D.   On: 10/16/2021 10:51      Subjective: No dyspnea, no abdominal pain, no nausea, no vomiting, no fever, complaining of left shoulder pain, but pain has improved significantly since admission, as well improving with as needed medications.  Discharge Exam: Vitals:   10/20/21 0800 10/20/21 1210  BP: (!) 152/95 138/90  Pulse: 92 100  Resp: 16 18  Temp: 98.8 F (37.1 C) 98.3 F (36.8 C)  SpO2: 99% 100%   Vitals:   10/19/21 2332 10/20/21 0534 10/20/21 0800 10/20/21 1210  BP: (!) 147/87 137/90 (!) 152/95 138/90  Pulse: 100 100  92 100  Resp: 20 20 16 18   Temp: 98.9 F (37.2 C) 98.5 F  (36.9 C) 98.8 F (37.1 C) 98.3 F (36.8 C)  TempSrc: Oral Oral Oral Oral  SpO2: 96% 96% 99% 100%  Weight:      Height:        General: Pt is alert, awake, not in acute distress Cardiovascular: RRR, S1/S2 +, no rubs, no gallops Respiratory: CTA bilaterally, no wheezing, no rhonchi Abdominal: Soft, NT, ND, bowel sounds + Extremities: no edema, no cyanosis    The results of significant diagnostics from this hospitalization (including imaging, microbiology, ancillary and laboratory) are listed below for reference.     Microbiology: Recent Results (from the past 240 hour(s))  Urine Culture     Status: None   Collection Time: 10/13/21  2:18 PM   Specimen: In/Out Cath Urine  Result Value Ref Range Status   Specimen Description IN/OUT CATH URINE  Final   Special Requests NONE  Final   Culture   Final    NO GROWTH Performed at [facility] [facility], [address], [place], [place] [address]    Report Status 10/14/2021 FINAL  Final  Blood Culture (routine x 2)     Status: Abnormal   Collection Time: 10/13/21  2:23 PM   Specimen: BLOOD  Result Value Ref Range Status   Specimen Description BLOOD BLOOD RIGHT HAND  Final   Special Requests   Final    BOTTLES DRAWN AEROBIC AND ANAEROBIC Blood Culture results may not be optimal due to an inadequate volume of blood received in culture bottles   Culture  Setup Time   Final    GRAM NEGATIVE RODS IN BOTH AEROBIC AND ANAEROBIC BOTTLES CRITICAL RESULT CALLED TO, READ BACK BY AND VERIFIED WITH: K NEAL,RN@0500  10/14/21 [place] Performed at [facility], [place] [address] [place],  [address]    Culture KLEBSIELLA PNEUMONIAE (A)  Final   Report Status 10/16/2021 FINAL  Final   Organism ID, Bacteria KLEBSIELLA PNEUMONIAE  Final      Susceptibility   Klebsiella pneumoniae - MIC*    AMPICILLIN >=32 RESISTANT Resistant     CEFAZOLIN <=4 SENSITIVE Sensitive     CEFEPIME <=0.12 SENSITIVE Sensitive     CEFTAZIDIME <=1 SENSITIVE Sensitive      CEFTRIAXONE <=0.25 SENSITIVE Sensitive     CIPROFLOXACIN <=0.25 SENSITIVE Sensitive     GENTAMICIN <=1 SENSITIVE Sensitive     IMIPENEM <=0.25 SENSITIVE Sensitive     TRIMETH/SULFA <=20 SENSITIVE Sensitive     AMPICILLIN/SULBACTAM 4 SENSITIVE Sensitive     PIP/TAZO <=4 SENSITIVE Sensitive     * KLEBSIELLA PNEUMONIAE  Blood Culture ID Panel (Reflexed)     Status: Abnormal   Collection Time: 10/13/21  2:23 PM  Result Value Ref Range Status   Enterococcus faecalis NOT DETECTED NOT DETECTED Final   Enterococcus Faecium NOT DETECTED NOT DETECTED Final   Listeria monocytogenes NOT DETECTED NOT DETECTED Final   Staphylococcus species NOT DETECTED NOT DETECTED Final   Staphylococcus aureus (BCID) NOT DETECTED NOT DETECTED Final   Staphylococcus epidermidis NOT DETECTED NOT DETECTED Final   Staphylococcus lugdunensis NOT DETECTED NOT DETECTED Final   Streptococcus species NOT DETECTED NOT DETECTED Final   Streptococcus agalactiae NOT DETECTED NOT DETECTED Final   Streptococcus pneumoniae NOT DETECTED NOT DETECTED Final   Streptococcus pyogenes NOT DETECTED NOT DETECTED Final   A.calcoaceticus-baumannii NOT DETECTED NOT DETECTED Final   Bacteroides fragilis NOT DETECTED NOT DETECTED Final  Enterobacterales DETECTED (A) NOT DETECTED Final    Comment: Enterobacterales represent a large order of gram negative bacteria, not a single organism. CRITICAL RESULT CALLED TO, READ BACK BY AND VERIFIED WITH: K NEAL,RN@0500  10/14/21 [place]    Enterobacter cloacae complex NOT DETECTED NOT DETECTED Final   Escherichia coli NOT DETECTED NOT DETECTED Final   Klebsiella aerogenes NOT DETECTED NOT DETECTED Final   Klebsiella oxytoca NOT DETECTED NOT DETECTED Final   Klebsiella pneumoniae DETECTED (A) NOT DETECTED Final    Comment: CRITICAL RESULT CALLED TO, READ BACK BY AND VERIFIED WITH: K NEAL,RN@0500  10/14/21 [place]    Proteus species NOT DETECTED NOT DETECTED Final   Salmonella species NOT DETECTED NOT  DETECTED Final   Serratia marcescens NOT DETECTED NOT DETECTED Final   Haemophilus influenzae NOT DETECTED NOT DETECTED Final   Neisseria meningitidis NOT DETECTED NOT DETECTED Final   Pseudomonas aeruginosa NOT DETECTED NOT DETECTED Final   Stenotrophomonas maltophilia NOT DETECTED NOT DETECTED Final   Candida albicans NOT DETECTED NOT DETECTED Final   Candida auris NOT DETECTED NOT DETECTED Final   Candida glabrata NOT DETECTED NOT DETECTED Final   Candida krusei NOT DETECTED NOT DETECTED Final   Candida parapsilosis NOT DETECTED NOT DETECTED Final   Candida tropicalis NOT DETECTED NOT DETECTED Final   Cryptococcus neoformans/gattii NOT DETECTED NOT DETECTED Final   CTX-M ESBL NOT DETECTED NOT DETECTED Final   Carbapenem resistance IMP NOT DETECTED NOT DETECTED Final   Carbapenem resistance KPC NOT DETECTED NOT DETECTED Final   Carbapenem resistance NDM NOT DETECTED NOT DETECTED Final   Carbapenem resist OXA 48 LIKE NOT DETECTED NOT DETECTED Final   Carbapenem resistance VIM NOT DETECTED NOT DETECTED Final    Comment: Performed at [facility], [address], [place], [place] [address]  Blood Culture (routine x 2)     Status: Abnormal   Collection Time: 10/13/21  2:38 PM   Specimen: BLOOD  Result Value Ref Range Status   Specimen Description BLOOD BLOOD LEFT WRIST  Final   Special Requests   Final    BOTTLES DRAWN AEROBIC AND ANAEROBIC Blood Culture adequate volume   Culture  Setup Time   Final    GRAM NEGATIVE RODS IN BOTH AEROBIC AND ANAEROBIC BOTTLES CRITICAL VALUE NOTED.  VALUE IS CONSISTENT WITH PREVIOUSLY REPORTED AND CALLED VALUE.    Culture (A)  Final    KLEBSIELLA PNEUMONIAE SUSCEPTIBILITIES PERFORMED ON PREVIOUS CULTURE WITHIN THE LAST 5 DAYS. Performed at [facility], [place] [address] [place], [place] [address]    Report Status 10/16/2021 FINAL  Final  SARS Coronavirus 2 by RT PCR (hospital order, performed in [facility] hospital lab) *cepheid single  result test* Anterior Nasal Swab     Status: None   Collection Time: 10/13/21  2:42 PM   Specimen: Anterior Nasal Swab  Result Value Ref Range Status   SARS Coronavirus 2 by RT PCR NEGATIVE NEGATIVE Final    Comment: (NOTE) SARS-CoV-2 target nucleic acids are NOT DETECTED.  The SARS-CoV-2 RNA is generally detectable in upper and lower respiratory specimens during the acute phase of infection. The lowest concentration of SARS-CoV-2 viral copies this assay can detect is 250 copies / mL. A negative result does not preclude SARS-CoV-2 infection and should not be used as the sole basis for treatment or other patient management decisions.  A negative result may occur with improper specimen collection / handling, submission of specimen other than nasopharyngeal swab, presence of viral mutation(s) within  the areas targeted by this assay, and inadequate number of viral copies (<250 copies / mL). A negative result must be combined with clinical observations, patient history, and epidemiological information.  Fact Sheet for Patients:   https://www.patel.info/  Fact Sheet for Healthcare Providers: https://hall.com/  This test is not yet approved or  cleared by the [place] FDA and has been authorized for detection and/or diagnosis of SARS-CoV-2 by FDA under an Emergency Use Authorization (EUA).  This EUA will remain in effect (meaning this test can be used) for the duration of the COVID-19 declaration under Section 564(b)(1) of the Act, 21 U.S.C. section 360bbb-3(b)(1), unless the authorization is terminated or revoked sooner.  Performed at [facility], [place] [address], [place], [place] [address]   Blood culture (routine x 2)     Status: Abnormal   Collection Time: 10/14/21 11:05 PM   Specimen: BLOOD LEFT FOREARM  Result Value Ref Range Status   Specimen Description BLOOD LEFT FOREARM  Final   Special Requests   Final    BOTTLES  DRAWN AEROBIC AND ANAEROBIC Blood Culture results may not be optimal due to an excessive volume of blood received in culture bottles   Culture  Setup Time   Final    GRAM NEGATIVE RODS IN BOTH AEROBIC AND ANAEROBIC BOTTLES CRITICAL VALUE NOTED.  VALUE IS CONSISTENT WITH PREVIOUSLY REPORTED AND CALLED VALUE. CRITICAL RESULT CALLED TO, READ BACK BY AND VERIFIED WITH: PHARMD J.MELIN AT 7939 ON 10/15/2021 BY T.SAAD    Culture (A)  Final    KLEBSIELLA PNEUMONIAE SUSCEPTIBILITIES PERFORMED ON PREVIOUS CULTURE WITHIN THE LAST 5 DAYS. Performed at [facility], [place] [address] [place], [place] [address]    Report Status 10/17/2021 FINAL  Final  Blood culture (routine x 2)     Status: Abnormal   Collection Time: 10/14/21 11:08 PM   Specimen: BLOOD RIGHT HAND  Result Value Ref Range Status   Specimen Description BLOOD RIGHT HAND  Final   Special Requests   Final    BOTTLES DRAWN AEROBIC AND ANAEROBIC Blood Culture adequate volume   Culture  Setup Time   Final    GRAM NEGATIVE RODS IN BOTH AEROBIC AND ANAEROBIC BOTTLES CRITICAL VALUE NOTED.  VALUE IS CONSISTENT WITH PREVIOUSLY REPORTED AND CALLED VALUE.    Culture (A)  Final    KLEBSIELLA PNEUMONIAE SUSCEPTIBILITIES PERFORMED ON PREVIOUS CULTURE WITHIN THE LAST 5 DAYS. Performed at [facility], [place] [address], [place], [place] [address]    Report Status 10/17/2021 FINAL  Final  Aerobic/Anaerobic Culture w Gram Stain (surgical/deep wound)     Status: None (Preliminary result)   Collection Time: 10/19/21  9:52 AM   Specimen: Liver; Abscess  Result Value Ref Range Status   Specimen Description LIVER  Final   Special Requests ABSCESS  Final   Gram Stain   Final    ABUNDANT WBC PRESENT, PREDOMINANTLY PMN NO ORGANISMS SEEN    Culture   Final    FEW GRAM NEGATIVE RODS IDENTIFICATION AND SUSCEPTIBILITIES TO FOLLOW Performed at [facility], [place] [address] [place], [place] [address]    Report Status PENDING  Incomplete      Labs: BNP (last 3 results) No results for input(s): BNP in the last 8760 hours. Basic Metabolic Panel: Recent Labs  Lab 10/15/21 0510 10/15/21 2309 10/17/21 0431 10/18/21 0141 10/19/21 0235 10/20/21 0239  NA 137 136 134* 133* 137 133*  K 3.8 3.3* 3.3* 4.5 4.1 4.1  CL 104 104 106 104 100 101  CO2 20* 26 24 25 29 25   GLUCOSE 282* 192* 175* 261* 218* 265*  BUN 12 12 10 11 9 12   CREATININE 0.87 0.80 0.72 0.72 0.71 0.70  CALCIUM 8.0* 7.9* 7.4* 8.2* 8.4* 8.6*  MG 1.9  --   --   --   --   --    Liver Function Tests: Recent Labs  Lab 10/15/21 2309 10/17/21 0431 10/18/21 0141 10/19/21 0235 10/20/21 0239  AST 102* 70* 49* 41 41  ALT 71* 66* 60* 55* 50*  ALKPHOS 67 65 84 92 98  BILITOT 0.6 0.5 0.4 0.7 0.6  PROT 5.5* 5.3* 5.9* 6.3* 6.3*  ALBUMIN 2.4* 2.2* 2.4* 2.5* 2.5*   No results for input(s): LIPASE, AMYLASE in the last 168 hours. No results for input(s): AMMONIA in the last 168 hours. CBC: Recent Labs  Lab 10/14/21 2007 10/15/21 0510 10/15/21 2309 10/17/21 0431 10/18/21 0141 10/19/21 0235 10/20/21 0239  WBC 9.7 8.5 8.9 6.8 7.7 8.5 8.5  NEUTROABS 8.8* 7.7 7.7  --   --   --   --   HGB 16.2 15.4 14.5 14.1 14.7 15.3 14.3  HCT 49.1 44.1 42.3 40.8 42.9 44.9 43.8  MCV 87.7 85.8 85.3 84.1 85.6 84.9 86.7  PLT 151 108* 83* 98* 142* 195 258   Cardiac Enzymes: No results for input(s): CKTOTAL, CKMB, CKMBINDEX, TROPONINI in the last 168 hours. BNP: Invalid input(s): POCBNP CBG: Recent Labs  Lab 10/19/21 1158 10/19/21 1702 10/19/21 2100 10/20/21 0538 10/20/21 1210  GLUCAP 213* 278* 247* 308* 355*   D-Dimer No results for input(s): DDIMER in the last 72 hours. Hgb A1c No results for input(s): HGBA1C in the last 72 hours. Lipid Profile No results for input(s): CHOL, HDL, LDLCALC, TRIG, CHOLHDL, LDLDIRECT in the last 72 hours. Thyroid function studies No results for input(s): TSH, T4TOTAL, T3FREE, THYROIDAB in the last 72 hours.  Invalid input(s):  FREET3 Anemia work up No results for input(s): VITAMINB12, FOLATE, FERRITIN, TIBC, IRON, RETICCTPCT in the last 72 hours. Urinalysis    Component Value Date/Time   COLORURINE YELLOW 10/15/2021 0010   APPEARANCEUR CLEAR 10/15/2021 0010   LABSPEC 1.016 10/15/2021 0010   PHURINE 6.0 10/15/2021 0010   GLUCOSEU >=500 (A) 10/15/2021 0010   HGBUR SMALL (A) 10/15/2021 0010   HGBUR negative 07/05/2008 1138   BILIRUBINUR NEGATIVE 10/15/2021 0010   KETONESUR 80 (A) 10/15/2021 0010   PROTEINUR NEGATIVE 10/15/2021 0010   UROBILINOGEN 0.2 07/05/2008 1138   NITRITE NEGATIVE 10/15/2021 0010   LEUKOCYTESUR NEGATIVE 10/15/2021 0010   Sepsis Labs Invalid input(s): PROCALCITONIN,  WBC,  LACTICIDVEN Microbiology Recent Results (from the past 240 hour(s))  Urine Culture     Status: None   Collection Time: 10/13/21  2:18 PM   Specimen: In/Out Cath Urine  Result Value Ref Range Status   Specimen Description IN/OUT CATH URINE  Final   Special Requests NONE  Final   Culture   Final    NO GROWTH Performed at [facility], [place] [address] [place], [place] [address]    Report Status 10/14/2021 FINAL  Final  Blood Culture (routine x 2)     Status: Abnormal   Collection Time: 10/13/21  2:23 PM   Specimen: BLOOD  Result Value Ref Range Status   Specimen Description BLOOD BLOOD RIGHT HAND  Final   Special Requests   Final    BOTTLES DRAWN AEROBIC AND ANAEROBIC Blood Culture results may not be  optimal due to an inadequate volume of blood received in culture bottles   Culture  Setup Time   Final    GRAM NEGATIVE RODS IN BOTH AEROBIC AND ANAEROBIC BOTTLES CRITICAL RESULT CALLED TO, READ BACK BY AND VERIFIED WITH: K NEAL,RN@0500  10/14/21 [place] Performed at [facility], [address], [place], [place] [address]    Culture KLEBSIELLA PNEUMONIAE (A)  Final   Report Status 10/16/2021 FINAL  Final   Organism ID, Bacteria KLEBSIELLA PNEUMONIAE  Final      Susceptibility   Klebsiella pneumoniae -  MIC*    AMPICILLIN >=32 RESISTANT Resistant     CEFAZOLIN <=4 SENSITIVE Sensitive     CEFEPIME <=0.12 SENSITIVE Sensitive     CEFTAZIDIME <=1 SENSITIVE Sensitive     CEFTRIAXONE <=0.25 SENSITIVE Sensitive     CIPROFLOXACIN <=0.25 SENSITIVE Sensitive     GENTAMICIN <=1 SENSITIVE Sensitive     IMIPENEM <=0.25 SENSITIVE Sensitive     TRIMETH/SULFA <=20 SENSITIVE Sensitive     AMPICILLIN/SULBACTAM 4 SENSITIVE Sensitive     PIP/TAZO <=4 SENSITIVE Sensitive     * KLEBSIELLA PNEUMONIAE  Blood Culture ID Panel (Reflexed)     Status: Abnormal   Collection Time: 10/13/21  2:23 PM  Result Value Ref Range Status   Enterococcus faecalis NOT DETECTED NOT DETECTED Final   Enterococcus Faecium NOT DETECTED NOT DETECTED Final   Listeria monocytogenes NOT DETECTED NOT DETECTED Final   Staphylococcus species NOT DETECTED NOT DETECTED Final   Staphylococcus aureus (BCID) NOT DETECTED NOT DETECTED Final   Staphylococcus epidermidis NOT DETECTED NOT DETECTED Final   Staphylococcus lugdunensis NOT DETECTED NOT DETECTED Final   Streptococcus species NOT DETECTED NOT DETECTED Final   Streptococcus agalactiae NOT DETECTED NOT DETECTED Final   Streptococcus pneumoniae NOT DETECTED NOT DETECTED Final   Streptococcus pyogenes NOT DETECTED NOT DETECTED Final   A.calcoaceticus-baumannii NOT DETECTED NOT DETECTED Final   Bacteroides fragilis NOT DETECTED NOT DETECTED Final   Enterobacterales DETECTED (A) NOT DETECTED Final    Comment: Enterobacterales represent a large order of gram negative bacteria, not a single organism. CRITICAL RESULT CALLED TO, READ BACK BY AND VERIFIED WITH: K NEAL,RN@0500  10/14/21 [place]    Enterobacter cloacae complex NOT DETECTED NOT DETECTED Final   Escherichia coli NOT DETECTED NOT DETECTED Final   Klebsiella aerogenes NOT DETECTED NOT DETECTED Final   Klebsiella oxytoca NOT DETECTED NOT DETECTED Final   Klebsiella pneumoniae DETECTED (A) NOT DETECTED Final    Comment: CRITICAL RESULT  CALLED TO, READ BACK BY AND VERIFIED WITH: K NEAL,RN@0500  10/14/21 [place]    Proteus species NOT DETECTED NOT DETECTED Final   Salmonella species NOT DETECTED NOT DETECTED Final   Serratia marcescens NOT DETECTED NOT DETECTED Final   Haemophilus influenzae NOT DETECTED NOT DETECTED Final   Neisseria meningitidis NOT DETECTED NOT DETECTED Final   Pseudomonas aeruginosa NOT DETECTED NOT DETECTED Final   Stenotrophomonas maltophilia NOT DETECTED NOT DETECTED Final   Candida albicans NOT DETECTED NOT DETECTED Final   Candida auris NOT DETECTED NOT DETECTED Final   Candida glabrata NOT DETECTED NOT DETECTED Final   Candida krusei NOT DETECTED NOT DETECTED Final   Candida parapsilosis NOT DETECTED NOT DETECTED Final   Candida tropicalis NOT DETECTED NOT DETECTED Final   Cryptococcus neoformans/gattii NOT DETECTED NOT DETECTED Final   CTX-M ESBL NOT DETECTED NOT DETECTED Final   Carbapenem resistance IMP NOT DETECTED NOT DETECTED Final   Carbapenem resistance KPC NOT DETECTED NOT DETECTED Final   Carbapenem  resistance NDM NOT DETECTED NOT DETECTED Final   Carbapenem resist OXA 48 LIKE NOT DETECTED NOT DETECTED Final   Carbapenem resistance VIM NOT DETECTED NOT DETECTED Final    Comment: Performed at [facility], [place] [address], [place], [place] [address]  Blood Culture (routine x 2)     Status: Abnormal   Collection Time: 10/13/21  2:38 PM   Specimen: BLOOD  Result Value Ref Range Status   Specimen Description BLOOD BLOOD LEFT WRIST  Final   Special Requests   Final    BOTTLES DRAWN AEROBIC AND ANAEROBIC Blood Culture adequate volume   Culture  Setup Time   Final    GRAM NEGATIVE RODS IN BOTH AEROBIC AND ANAEROBIC BOTTLES CRITICAL VALUE NOTED.  VALUE IS CONSISTENT WITH PREVIOUSLY REPORTED AND CALLED VALUE.    Culture (A)  Final    KLEBSIELLA PNEUMONIAE SUSCEPTIBILITIES PERFORMED ON PREVIOUS CULTURE WITHIN THE LAST 5 DAYS. Performed at [facility], [place] [address]  [place], [place] [address]    Report Status 10/16/2021 FINAL  Final  SARS Coronavirus 2 by RT PCR (hospital order, performed in [facility] hospital lab) *cepheid single result test* Anterior Nasal Swab     Status: None   Collection Time: 10/13/21  2:42 PM   Specimen: Anterior Nasal Swab  Result Value Ref Range Status   SARS Coronavirus 2 by RT PCR NEGATIVE NEGATIVE Final    Comment: (NOTE) SARS-CoV-2 target nucleic acids are NOT DETECTED.  The SARS-CoV-2 RNA is generally detectable in upper and lower respiratory specimens during the acute phase of infection. The lowest concentration of SARS-CoV-2 viral copies this assay can detect is 250 copies / mL. A negative result does not preclude SARS-CoV-2 infection and should not be used as the sole basis for treatment or other patient management decisions.  A negative result may occur with improper specimen collection / handling, submission of specimen other than nasopharyngeal swab, presence of viral mutation(s) within the areas targeted by this assay, and inadequate number of viral copies (<250 copies / mL). A negative result must be combined with clinical observations, patient history, and epidemiological information.  Fact Sheet for Patients:   https://www.patel.info/  Fact Sheet for Healthcare Providers: https://hall.com/  This test is not yet approved or  cleared by the [place] FDA and has been authorized for detection and/or diagnosis of SARS-CoV-2 by FDA under an Emergency Use Authorization (EUA).  This EUA will remain in effect (meaning this test can be used) for the duration of the COVID-19 declaration under Section 564(b)(1) of the Act, 21 U.S.C. section 360bbb-3(b)(1), unless the authorization is terminated or revoked sooner.  Performed at [facility], [place] [address] [place], [place] [address]   Blood culture (routine x 2)     Status: Abnormal   Collection Time: 10/14/21  11:05 PM   Specimen: BLOOD LEFT FOREARM  Result Value Ref Range Status   Specimen Description BLOOD LEFT FOREARM  Final   Special Requests   Final    BOTTLES DRAWN AEROBIC AND ANAEROBIC Blood Culture results may not be optimal due to an excessive volume of blood received in culture bottles   Culture  Setup Time   Final    GRAM NEGATIVE RODS IN BOTH AEROBIC AND ANAEROBIC BOTTLES CRITICAL VALUE NOTED.  VALUE IS CONSISTENT WITH PREVIOUSLY REPORTED AND CALLED VALUE. CRITICAL RESULT CALLED TO, READ BACK BY AND VERIFIED WITH: PHARMD J.MELIN AT 0037 ON 10/15/2021 BY T.SAAD    Culture (A)  Final    KLEBSIELLA PNEUMONIAE SUSCEPTIBILITIES PERFORMED ON PREVIOUS CULTURE WITHIN THE LAST 5 DAYS. Performed at [facility], [place] [address] [place], [place] [address]    Report Status 10/17/2021 FINAL  Final  Blood culture (routine x 2)     Status: Abnormal   Collection Time: 10/14/21 11:08 PM   Specimen: BLOOD RIGHT HAND  Result Value Ref Range Status   Specimen Description BLOOD RIGHT HAND  Final   Special Requests   Final    BOTTLES DRAWN AEROBIC AND ANAEROBIC Blood Culture adequate volume   Culture  Setup Time   Final    GRAM NEGATIVE RODS IN BOTH AEROBIC AND ANAEROBIC BOTTLES CRITICAL VALUE NOTED.  VALUE IS CONSISTENT WITH PREVIOUSLY REPORTED AND CALLED VALUE.    Culture (A)  Final    KLEBSIELLA PNEUMONIAE SUSCEPTIBILITIES PERFORMED ON PREVIOUS CULTURE WITHIN THE LAST 5 DAYS. Performed at [facility], [place] [address] [place], [place] [address]    Report Status 10/17/2021 FINAL  Final  Aerobic/Anaerobic Culture w Gram Stain (surgical/deep wound)     Status: None (Preliminary result)   Collection Time: 10/19/21  9:52 AM   Specimen: Liver; Abscess  Result Value Ref Range Status   Specimen Description LIVER  Final   Special Requests ABSCESS  Final   Gram Stain   Final    ABUNDANT WBC PRESENT, PREDOMINANTLY PMN NO ORGANISMS SEEN    Culture   Final    FEW GRAM NEGATIVE  RODS IDENTIFICATION AND SUSCEPTIBILITIES TO FOLLOW Performed at [facility], [place] [address], [place], [place] [address]    Report Status PENDING  Incomplete     Time coordinating discharge: Over 30 minutes  SIGNED:   Phillips Climes, MD  [facility] 10/20/2021, 3:27 PM Pager   If 7PM-7AM, please contact night-coverage [email or web address] Password [email or web address]

## 2021-10-20 NOTE — Progress Notes (Signed)
[facility]  Date of Admission:  10/14/2021   Total days of inpatient antibiotics 5  Principal Problem:   Pneumonia of both lungs due to Klebsiella pneumoniae [facility]) Active Problems:   Uncontrolled type 2 diabetes mellitus with hyperglycemia, without long-term current use of insulin (HCC)   Bacteremia due to Klebsiella pneumoniae   Essential hypertension   Mixed hyperlipidemia   Lactic acidosis   Liver abscess          Assessment: 51 year old Spanish-speaking male admitted due to being called back for positive blood culture. He had initially presented to ED on 5/30 with syncopal episode along with chest and left shoulder pain.  He had been complaining intermittent fevers and loss of appetite.  Found to have Klebsiella pneumoniae bacteremia.   #Klebsiella pneumoniae bactermia likely 2/2 liver abscess SP aspiration on 6/5 #Elevated AST/ALT -CT chest on 6/1 showed ground glass opacities-pt has no respiratory symptoms. -Abdominal ultrasound that HIDA scan was done to look for biliary source of bacteremia. -CT abdomen pelvis showed 2.9 cm liver lesion.  Demographics: He emigrated from [place] in 2001. Denies any other travel. 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 Reports excessive Etoh use at times(weekend/parties 6-8 drinks). Denies tobacco or illicit drug use.  MR liver showed 2.3 x 2.0 x 2.0 cm liver lesion. Pt. underwent CT guided aspiration on 6/5 with Cx pending. MR of left shoulder showed intra-articular biceps tendinosis. Of note his shoulder pain has resolved after pain meds this AM(norco).   Recommendations:  -D/C cefazolin -Start cefadroxil and metronidazole, anticipate 4 weeks of antibiotics from aspiration EOT 11/15/21 -Follow-up aspirate Cx. SO growing few GNR, likely kleb pneumo(will plan on string test to look for hyper-virulent KP once speciation is final. Hyper virulent KP has been observed in [place].  -Follow-up in ID  clinic with myself on 6/19 -Ground glass opacities can be followed outpatient  Microbiology:   Antibiotics: Cefepime 5/31 Ceftriaxone 5/31 to 6/2 Cefazolin 6/03-p   Cultures: Blood 5/30 2/2 kleb pneumoniae 5/31 2/2 Kleb pneumoniae  6/5 Aspirate Cx+ GNR   Urine 5/30 no growth  SUBJECTIVE: Resting in bed. No new complaints. He reports shoulder pain is improved after pain medications.  Interval: Afebrile overnight.  Review of Systems: Review of Systems  All other systems reviewed and are negative.   Scheduled Meds:  cefadroxil  1,000 mg Oral BID   enoxaparin (LOVENOX) injection  40 mg Subcutaneous Q24H   feeding supplement  237 mL Oral BID BM   insulin aspart  0-15 Units Subcutaneous TID AC & HS   insulin aspart  5 Units Subcutaneous TID WC   insulin glargine-yfgn  35 Units Subcutaneous Daily   metroNIDAZOLE  500 mg Oral Q12H   multivitamin with minerals  1 tablet Oral Daily   Continuous Infusions:  sodium chloride Stopped (10/15/21 0601)   sodium chloride 50 mL/hr at 10/20/21 0552   PRN Meds:.sodium chloride, acetaminophen **OR** acetaminophen, albuterol, HYDROcodone-acetaminophen, methocarbamol, morphine injection, ondansetron **OR** ondansetron (ZOFRAN) IV, polyethylene glycol, traMADol No Known Allergies  OBJECTIVE: Vitals:   10/19/21 2058 10/19/21 2332 10/20/21 0534 10/20/21 0800  BP: (!) 148/84 (!) 147/87 137/90 (!) 152/95  Pulse: 100 100 100 92  Resp: 20 20 20 16   Temp: 98.6 F (37 C) 98.9 F (37.2 C) 98.5 F (36.9 C) 98.8 F (37.1 C)  TempSrc: Oral Oral Oral Oral  SpO2: 100% 96% 96% 99%  Weight:      Height:  Body mass index is 25.72 kg/m.  Physical Exam Constitutional:      General: He is not in acute distress.    Appearance: He is normal weight. He is not toxic-appearing.  HENT:     Head: Normocephalic and atraumatic.     Right Ear: External ear normal.     Left Ear: External ear normal.     Nose: No congestion or rhinorrhea.      Mouth/Throat:     Mouth: Mucous membranes are moist.     Pharynx: Oropharynx is clear.  Eyes:     Extraocular Movements: Extraocular movements intact.     Conjunctiva/sclera: Conjunctivae normal.     Pupils: Pupils are equal, round, and reactive to light.  Cardiovascular:     Rate and Rhythm: Normal rate and regular rhythm.     Heart sounds: No murmur heard.   No friction rub. No gallop.  Pulmonary:     Effort: Pulmonary effort is normal.     Breath sounds: Normal breath sounds.  Abdominal:     General: Abdomen is flat. Bowel sounds are normal.     Palpations: Abdomen is soft.  Musculoskeletal:        General: No swelling.     Cervical back: Normal range of motion and neck supple.     Comments: Left shoulder pain improved  Skin:    General: Skin is warm and dry.  Neurological:     General: No focal deficit present.     Mental Status: He is oriented to person, place, and time.  Psychiatric:        Mood and Affect: Mood normal.      Lab Results Lab Results  Component Value Date   WBC 8.5 10/20/2021   HGB 14.3 10/20/2021   HCT 43.8 10/20/2021   MCV 86.7 10/20/2021   PLT 258 10/20/2021    Lab Results  Component Value Date   CREATININE 0.70 10/20/2021   BUN 12 10/20/2021   NA 133 (L) 10/20/2021   K 4.1 10/20/2021   CL 101 10/20/2021   CO2 25 10/20/2021    Lab Results  Component Value Date   ALT 50 (H) 10/20/2021   AST 41 10/20/2021   ALKPHOS 98 10/20/2021   BILITOT 0.6 10/20/2021        Danelle Earthly, MD [facility] 10/20/2021, 11:24 AM

## 2021-10-21 ENCOUNTER — Other Ambulatory Visit (HOSPITAL_COMMUNITY): Payer: Self-pay

## 2021-10-22 ENCOUNTER — Other Ambulatory Visit (HOSPITAL_COMMUNITY): Payer: Self-pay

## 2021-10-24 LAB — AEROBIC/ANAEROBIC CULTURE W GRAM STAIN (SURGICAL/DEEP WOUND)

## 2021-10-26 ENCOUNTER — Other Ambulatory Visit (HOSPITAL_COMMUNITY): Payer: Self-pay

## 2021-11-02 ENCOUNTER — Ambulatory Visit (INDEPENDENT_AMBULATORY_CARE_PROVIDER_SITE_OTHER): Payer: Self-pay | Admitting: Internal Medicine

## 2021-11-02 ENCOUNTER — Other Ambulatory Visit: Payer: Self-pay

## 2021-11-02 ENCOUNTER — Encounter: Payer: Self-pay | Admitting: Internal Medicine

## 2021-11-02 VITALS — BP 143/89 | HR 79 | Temp 97.4°F | Wt 148.0 lb

## 2021-11-02 DIAGNOSIS — K750 Abscess of liver: Secondary | ICD-10-CM

## 2021-11-02 NOTE — Progress Notes (Addendum)
Patient Active Problem List   Diagnosis Date Noted   Liver abscess 10/19/2021   Pneumonia of both lungs due to Klebsiella pneumoniae ([place]) 10/15/2021   Essential hypertension 10/15/2021   Mixed hyperlipidemia 10/15/2021   Lactic acidosis 10/15/2021   Bacteremia due to Klebsiella pneumoniae 10/14/2021   TINEA CORPORIS 08/22/2008   Uncontrolled type 2 diabetes mellitus with hyperglycemia, without long-term current use of insulin ([place]) 03/07/2008   MICROSCOPIC HEMATURIA 03/07/2008   GLYCOSURIA 03/07/2008   GASTROESOPHAGEAL REFLUX DISEASE 01/25/2008   CONSTIPATION 01/25/2008    Patient's Medications  New Prescriptions   No medications on file  Previous Medications   ACCU-CHEK SOFTCLIX LANCETS LANCETS    Use As Directed   ACETAMINOPHEN (TYLENOL) 325 MG TABLET    Take 2 tablets (650 mg total) by mouth every 6 (six) hours as needed for mild pain or moderate pain (or Fever >/= 101).   AMLODIPINE (NORVASC) 5 MG TABLET    Take 1 tablet (5 mg total) by mouth daily.   ATORVASTATIN (LIPITOR) 20 MG TABLET    Take 1 tablet (20 mg total) by mouth daily.   BLOOD GLUCOSE MONITORING SUPPL (ACCU-CHEK GUIDE) W/DEVICE KIT    Use As Directed   CEFADROXIL (DURICEF) 500 MG CAPSULE    Take 2 capsules (1,000 mg total) by mouth 2 (two) times daily for 26 days.   DICLOFENAC SODIUM (VOLTAREN) 1 % GEL    Apply 4 g topically 4 (four) times daily.   GLUCOSE BLOOD TEST STRIP    Use As Directed   INSULIN GLARGINE-YFGN (SEMGLEE) 100 UNIT/ML PEN    Inject 40 Units into the skin daily.   INSULIN PEN NEEDLE 32G X 4 MM MISC    Use As Directed   METFORMIN (GLUCOPHAGE) 500 MG TABLET    Take 1 tablet (500 mg total) by mouth 2 (two) times daily with a meal.   METHOCARBAMOL (ROBAXIN) 500 MG TABLET    Take 1 tablet (500 mg total) by mouth every 6 (six) hours as needed for muscle spasms.   METRONIDAZOLE (FLAGYL) 500 MG TABLET    Take 1 tablet (500 mg total) by mouth every 12 (twelve) hours for 26 days.  Modified  Medications   No medications on file  Discontinued Medications   No medications on file    Subjective: 51 year old Spanish-speaking male presents for hospital follow-up of Klebsiella pneumonia bacteremia with a pelvic abscess.  Patient was hospitalized at [facility] 6/1- 6/6 with complaint of intermittent fever and loss of appetite.  Found to have Klebsiella pneumonia bacteremia.  CT on 6/1 showed groundglass opacities without respiratory symptoms.  CT abdomen pelvis in the setting of elevated AST/ALT shows 2.9 cm liver lesion.  MRI showed 2.3 cm liver lesion.  Patient underwent CT-guided aspiration with IR on 6/5 with cultures growing Klebsiella pneumoniae.  Patient complained of left shoulder pain during hospitalization which showed intra-articular biceps tendinosis.  He was on cefazolin during hospitalization and transitioned to cefadroxil and metronidazole on discharge to complete 4 weeks of antibiotics for aspiration EOT 11/2021.  There was concern for hypervirulent Kleb pneumo given hepatic abscess and patient immigrated from [place] city.  String test was not obtained. Today 11/02/2021:Interpreter used during visit.  Patient reports that his shoulder pain is much improved.  He is able to tolerate antibiotics.  Reports he has not taken metronidazole only once a day.  Denies abdominal pain, fever, chills.  Review of Systems: Review of Systems  All other systems reviewed and are negative.   Past Medical History:  Diagnosis Date   Diabetes mellitus without complication ([place])    Hypertension     Social History   Tobacco Use   Smoking status: Never  Substance Use Topics   Alcohol use: Yes    Comment: little   Drug use: No    Family History  Problem Relation Age of Onset   Hypertension Brother    Diabetes Brother    Heart disease Neg Hx     No Known Allergies  Health Maintenance  Topic Date Due   COVID-19 Vaccine (1) Never done   FOOT EXAM  Never done   OPHTHALMOLOGY EXAM   Never done   TETANUS/TDAP  Never done   URINE MICROALBUMIN  07/05/2009   COLONOSCOPY (Pts 45-34yr Insurance coverage will need to be confirmed)  Never done   INFLUENZA VACCINE  12/15/2021   HEMOGLOBIN A1C  04/16/2022   Hepatitis C Screening  Completed   HIV Screening  Completed   HPV VACCINES  Aged Out    Objective:  There were no vitals filed for this visit. There is no height or weight on file to calculate BMI.  Physical Exam Constitutional:      General: He is not in acute distress.    Appearance: He is normal weight. He is not toxic-appearing.  HENT:     Head: Normocephalic and atraumatic.     Right Ear: External ear normal.     Left Ear: External ear normal.     Nose: No congestion or rhinorrhea.     Mouth/Throat:     Mouth: Mucous membranes are moist.     Pharynx: Oropharynx is clear.  Eyes:     Extraocular Movements: Extraocular movements intact.     Conjunctiva/sclera: Conjunctivae normal.     Pupils: Pupils are equal, round, and reactive to light.  Cardiovascular:     Rate and Rhythm: Normal rate and regular rhythm.     Heart sounds: No murmur heard.    No friction rub. No gallop.  Pulmonary:     Effort: Pulmonary effort is normal.     Breath sounds: Normal breath sounds.  Abdominal:     General: Abdomen is flat. Bowel sounds are normal.     Palpations: Abdomen is soft.  Musculoskeletal:        General: No swelling. Normal range of motion.     Cervical back: Normal range of motion and neck supple.  Skin:    General: Skin is warm and dry.  Neurological:     General: No focal deficit present.     Mental Status: He is oriented to person, place, and time.  Psychiatric:        Mood and Affect: Mood normal.     Lab Results Lab Results  Component Value Date   WBC 8.5 10/20/2021   HGB 14.3 10/20/2021   HCT 43.8 10/20/2021   MCV 86.7 10/20/2021   PLT 258 10/20/2021    Lab Results  Component Value Date   CREATININE 0.70 10/20/2021   BUN 12 10/20/2021    NA 133 (L) 10/20/2021   K 4.1 10/20/2021   CL 101 10/20/2021   CO2 25 10/20/2021    Lab Results  Component Value Date   ALT 50 (H) 10/20/2021   AST 41 10/20/2021   ALKPHOS 98 10/20/2021   BILITOT 0.6 10/20/2021    Lab Results  Component Value Date   CHOL 206 03/09/2008  HDL 41 03/09/2008   LDLCALC 117 03/09/2008   TRIG 238 03/09/2008   CHOLHDL 5.0 03/09/2008   Lab Results  Component Value Date   LABRPR NON REAC 03/07/2008   No results found for: "HIV1RNAQUANT", "HIV1RNAVL", "CD4TABS"   #Klebsiella pneumoniae bactermia likely 2/2 liver abscess SP aspiration on 6/5 with Cx+Kleb pneumoniae -CT AP for next week to assess hepatic abscess. Of note I have a high suspicion for hypervirulent Kleb pneumo given pt emigrated from [place]. String test was not obtained.  -Continue cefadroxil and metronidazole EOT 7/2(4weeks from aspiration)  He has been takning metronidazole once a day, counseled to take bid.  -Labs today: cbc, cmp, esr, crp -F/U 7/18, will get labs off of antibiotics(pending CT read)  I spent more than 45 minutes for this patient encounter including reviewing data/chart, and coordinating care and >50% direct face to face time providing counseling/discussing diagnostics/treatment plan with patient   Laurice Record, [facility] 11/02/2021, 11:15 AM

## 2021-11-02 NOTE — Addendum Note (Signed)
Addended byDanelle Earthly on: 11/02/2021 04:30 PM   Modules accepted: Level of Service

## 2021-11-03 LAB — CBC WITH DIFFERENTIAL/PLATELET
Absolute Monocytes: 616 cells/uL (ref 200–950)
Basophils Absolute: 88 cells/uL (ref 0–200)
Basophils Relative: 1 %
Eosinophils Absolute: 97 cells/uL (ref 15–500)
Eosinophils Relative: 1.1 %
HCT: 44.6 % (ref 38.5–50.0)
Hemoglobin: 14.8 g/dL (ref 13.2–17.1)
Lymphs Abs: 1549 cells/uL (ref 850–3900)
MCH: 28.2 pg (ref 27.0–33.0)
MCHC: 33.2 g/dL (ref 32.0–36.0)
MCV: 85.1 fL (ref 80.0–100.0)
MPV: 10.3 fL (ref 7.5–12.5)
Monocytes Relative: 7 %
Neutro Abs: 6450 cells/uL (ref 1500–7800)
Neutrophils Relative %: 73.3 %
Platelets: 554 10*3/uL — ABNORMAL HIGH (ref 140–400)
RBC: 5.24 10*6/uL (ref 4.20–5.80)
RDW: 12.8 % (ref 11.0–15.0)
Total Lymphocyte: 17.6 %
WBC: 8.8 10*3/uL (ref 3.8–10.8)

## 2021-11-03 LAB — COMPLETE METABOLIC PANEL WITH GFR
AG Ratio: 0.9 (calc) — ABNORMAL LOW (ref 1.0–2.5)
ALT: 20 U/L (ref 9–46)
AST: 16 U/L (ref 10–40)
Albumin: 3.6 g/dL (ref 3.6–5.1)
Alkaline phosphatase (APISO): 80 U/L (ref 36–130)
BUN: 12 mg/dL (ref 7–25)
CO2: 29 mmol/L (ref 20–32)
Calcium: 9.7 mg/dL (ref 8.6–10.3)
Chloride: 102 mmol/L (ref 98–110)
Creat: 0.67 mg/dL (ref 0.60–1.29)
Globulin: 3.8 g/dL (calc) — ABNORMAL HIGH (ref 1.9–3.7)
Glucose, Bld: 149 mg/dL — ABNORMAL HIGH (ref 65–99)
Potassium: 4.1 mmol/L (ref 3.5–5.3)
Sodium: 140 mmol/L (ref 135–146)
Total Bilirubin: 0.3 mg/dL (ref 0.2–1.2)
Total Protein: 7.4 g/dL (ref 6.1–8.1)
eGFR: 114 mL/min/{1.73_m2} (ref >=60)

## 2021-11-03 LAB — C-REACTIVE PROTEIN: CRP: 15.8 mg/L — ABNORMAL HIGH (ref <8.0)

## 2021-11-03 LAB — SEDIMENTATION RATE: Sed Rate: 91 mm/h — ABNORMAL HIGH (ref 0–15)

## 2021-11-04 ENCOUNTER — Inpatient Hospital Stay: Admit: 2021-11-04 | Payer: Self-pay | Source: Ambulatory Visit | Admitting: Family Medicine

## 2021-11-04 ENCOUNTER — Encounter: Payer: Self-pay | Admitting: Emergency Medicine

## 2021-11-04 ENCOUNTER — Encounter (HOSPITAL_COMMUNITY): Payer: Self-pay

## 2021-11-04 ENCOUNTER — Other Ambulatory Visit: Payer: Self-pay

## 2021-11-04 ENCOUNTER — Ambulatory Visit
Admission: EM | Admit: 2021-11-04 | Discharge: 2021-11-04 | Disposition: A | Discharge status: Home / Self Care | Payer: Self-pay | Attending: Internal Medicine | Admitting: Internal Medicine

## 2021-11-04 ENCOUNTER — Emergency Department (HOSPITAL_COMMUNITY): Payer: Self-pay

## 2021-11-04 ENCOUNTER — Inpatient Hospital Stay (HOSPITAL_COMMUNITY)
Admission: EM | Admit: 2021-11-04 | Discharge: 2021-11-16 | DRG: 854 | Disposition: A | Discharge status: Home Health | Payer: Self-pay | Attending: Internal Medicine | Admitting: Internal Medicine

## 2021-11-04 DIAGNOSIS — E1169 Type 2 diabetes mellitus with other specified complication: Secondary | ICD-10-CM | POA: Diagnosis present

## 2021-11-04 DIAGNOSIS — M009 Pyogenic arthritis, unspecified: Secondary | ICD-10-CM | POA: Diagnosis present

## 2021-11-04 DIAGNOSIS — R222 Localized swelling, mass and lump, trunk: Secondary | ICD-10-CM

## 2021-11-04 DIAGNOSIS — E1165 Type 2 diabetes mellitus with hyperglycemia: Secondary | ICD-10-CM | POA: Diagnosis present

## 2021-11-04 DIAGNOSIS — E785 Hyperlipidemia, unspecified: Secondary | ICD-10-CM | POA: Diagnosis present

## 2021-11-04 DIAGNOSIS — E119 Type 2 diabetes mellitus without complications: Secondary | ICD-10-CM | POA: Diagnosis present

## 2021-11-04 DIAGNOSIS — I10 Essential (primary) hypertension: Secondary | ICD-10-CM | POA: Diagnosis present

## 2021-11-04 DIAGNOSIS — E876 Hypokalemia: Secondary | ICD-10-CM | POA: Diagnosis present

## 2021-11-04 DIAGNOSIS — Z79899 Other long term (current) drug therapy: Secondary | ICD-10-CM

## 2021-11-04 DIAGNOSIS — M869 Osteomyelitis, unspecified: Secondary | ICD-10-CM | POA: Diagnosis present

## 2021-11-04 DIAGNOSIS — Z794 Long term (current) use of insulin: Secondary | ICD-10-CM

## 2021-11-04 DIAGNOSIS — Z833 Family history of diabetes mellitus: Secondary | ICD-10-CM

## 2021-11-04 DIAGNOSIS — A4159 Other Gram-negative sepsis: Principal | ICD-10-CM | POA: Diagnosis present

## 2021-11-04 DIAGNOSIS — R7881 Bacteremia: Secondary | ICD-10-CM | POA: Diagnosis present

## 2021-11-04 DIAGNOSIS — B961 Klebsiella pneumoniae [K. pneumoniae] as the cause of diseases classified elsewhere: Secondary | ICD-10-CM | POA: Diagnosis present

## 2021-11-04 DIAGNOSIS — Z8249 Family history of ischemic heart disease and other diseases of the circulatory system: Secondary | ICD-10-CM

## 2021-11-04 DIAGNOSIS — Z597 Insufficient social insurance and welfare support: Secondary | ICD-10-CM

## 2021-11-04 DIAGNOSIS — Z7984 Long term (current) use of oral hypoglycemic drugs: Secondary | ICD-10-CM

## 2021-11-04 LAB — CBC WITH DIFFERENTIAL/PLATELET
Abs Immature Granulocytes: 0.05 10*3/uL (ref 0.00–0.07)
Basophils Absolute: 0.1 10*3/uL (ref 0.0–0.1)
Basophils Relative: 1 %
Eosinophils Absolute: 0.2 10*3/uL (ref 0.0–0.5)
Eosinophils Relative: 2 %
HCT: 46.3 % (ref 39.0–52.0)
Hemoglobin: 14.7 g/dL (ref 13.0–17.0)
Immature Granulocytes: 1 %
Lymphocytes Relative: 22 %
Lymphs Abs: 1.6 10*3/uL (ref 0.7–4.0)
MCH: 28.1 pg (ref 26.0–34.0)
MCHC: 31.7 g/dL (ref 30.0–36.0)
MCV: 88.5 fL (ref 80.0–100.0)
Monocytes Absolute: 0.7 10*3/uL (ref 0.1–1.0)
Monocytes Relative: 9 %
Neutro Abs: 5 10*3/uL (ref 1.7–7.7)
Neutrophils Relative %: 65 %
Platelets: 496 10*3/uL — ABNORMAL HIGH (ref 150–400)
RBC: 5.23 MIL/uL (ref 4.22–5.81)
RDW: 13.2 % (ref 11.5–15.5)
WBC: 7.6 10*3/uL (ref 4.0–10.5)
nRBC: 0 % (ref 0.0–0.2)

## 2021-11-04 LAB — COMPREHENSIVE METABOLIC PANEL WITH GFR
ALT: 24 U/L (ref 0–44)
AST: 17 U/L (ref 15–41)
Albumin: 3.5 g/dL (ref 3.5–5.0)
Alkaline Phosphatase: 71 U/L (ref 38–126)
Anion gap: 7 (ref 5–15)
BUN: 14 mg/dL (ref 6–20)
CO2: 30 mmol/L (ref 22–32)
Calcium: 9.6 mg/dL (ref 8.9–10.3)
Chloride: 103 mmol/L (ref 98–111)
Creatinine, Ser: 0.69 mg/dL (ref 0.61–1.24)
GFR, Estimated: >60 mL/min
Glucose, Bld: 283 mg/dL — ABNORMAL HIGH (ref 70–99)
Potassium: 4.3 mmol/L (ref 3.5–5.1)
Sodium: 140 mmol/L (ref 135–145)
Total Bilirubin: 0.5 mg/dL (ref 0.3–1.2)
Total Protein: 8.3 g/dL — ABNORMAL HIGH (ref 6.5–8.1)

## 2021-11-04 LAB — SEDIMENTATION RATE: Sed Rate: 69 mm/hr — ABNORMAL HIGH (ref 0–16)

## 2021-11-04 LAB — C-REACTIVE PROTEIN: CRP: 1.6 mg/dL — ABNORMAL HIGH

## 2021-11-04 MED ORDER — CEFAZOLIN SODIUM-DEXTROSE 2-4 GM/100ML-% IV SOLN
2.0000 g | Freq: Once | INTRAVENOUS | Status: AC
  Administered 2021-11-04: 2 g via INTRAVENOUS
  Filled 2021-11-04: qty 100

## 2021-11-04 MED ORDER — METRONIDAZOLE 500 MG PO TABS
500.0000 mg | ORAL_TABLET | Freq: Two times a day (BID) | ORAL | Status: DC
  Administered 2021-11-04: 500 mg via ORAL
  Filled 2021-11-04: qty 1

## 2021-11-04 MED ORDER — IOHEXOL 300 MG/ML  SOLN
75.0000 mL | Freq: Once | INTRAMUSCULAR | Status: AC | PRN
Start: 2021-11-04 — End: 2021-11-04
  Administered 2021-11-04: 75 mL via INTRAVENOUS

## 2021-11-04 MED ORDER — LACTATED RINGERS IV BOLUS
1000.0000 mL | Freq: Once | INTRAVENOUS | Status: AC
  Administered 2021-11-04: 1000 mL via INTRAVENOUS

## 2021-11-04 MED ORDER — HYDROMORPHONE HCL 1 MG/ML IJ SOLN
1.0000 mg | Freq: Once | INTRAMUSCULAR | Status: AC
  Administered 2021-11-04: 1 mg via INTRAVENOUS
  Filled 2021-11-04: qty 1

## 2021-11-04 NOTE — ED Notes (Signed)
Pt has new pt appt with [facility] on 7/[facility].

## 2021-11-04 NOTE — ED Triage Notes (Addendum)
Patient says this pain in upper chest / left shoulder pain is the same as in the hospital.  Patient has a bump to left collar bone/sternum, hard to touch.  States this was there when in hospital.  Says medicine helps swelling and pain, but symptoms return when medicine wears off.  Taking acetaminophen 500 mg for pain

## 2021-11-04 NOTE — Progress Notes (Signed)
A consult was received from an ED physician for ancef per pharmacy dosing.  The patient's profile has been reviewed for ht/wt/allergies/indication/available labs.    A one time order has been placed for ancef 2gm IV x1.  Further antibiotics/pharmacy consults should be ordered by admitting physician if indicated.                       Thank you, Lucia Gaskins 11/04/2021  7:48 PM

## 2021-11-04 NOTE — ED Provider Notes (Signed)
[facility] DEPT Provider Note   CSN: [id number] Arrival date & time: 11/04/21  1548     History  Chief Complaint  Patient presents with   Abscess   Chest Pain    Benjamin Thompson is a 51 y.o. male.  HPI 51 year old male with a history of diabetes presents with left chest swelling.  History is taken with the help of the Spanish interpreter.  He states that this pain has been there for about 3 weeks.  It radiates to his shoulder.  He was recently in the hospital for a liver abscess and bacteremia.  The size of the chest swelling seems to wax and wane.  Pain is consistently severe.  No shortness of breath or fevers.  He is currently on oral antibiotics for the previously mentioned abscess.  No abdominal pain.  Went to urgent care and then orthopedics and orthopedics sent him here for a CT scan.  Home Medications Prior to Admission medications   Medication Sig Start Date End Date Taking? Authorizing Provider  acetaminophen (TYLENOL) 500 MG tablet Take 500 mg by mouth every 6 (six) hours as needed for moderate pain.   Yes [provider]  amLODipine (NORVASC) 5 MG tablet Take 1 tablet (5 mg total) by mouth daily. 10/20/21  Yes Elgergawy, Silver Huguenin, MD  atorvastatin (LIPITOR) 20 MG tablet Take 1 tablet (20 mg total) by mouth daily. 10/20/21 10/20/22 Yes Elgergawy, Silver Huguenin, MD  cefadroxil (DURICEF) 500 MG capsule Take 2 capsules (1,000 mg total) by mouth 2 (two) times daily for 26 days. Patient taking differently: Take 1,000 mg by mouth 2 (two) times daily. Start date : 10/23/21 10/20/21 11/15/21 Yes Elgergawy, Silver Huguenin, MD  diclofenac Sodium (VOLTAREN) 1 % GEL Apply 4 g topically 4 (four) times daily. Patient taking differently: Apply 4 g topically 2 (two) times daily. 10/20/21  Yes Elgergawy, Silver Huguenin, MD  insulin glargine-yfgn (SEMGLEE) 100 UNIT/ML Pen Inject 40 Units into the skin daily. 10/20/21  Yes Elgergawy, Silver Huguenin, MD  metFORMIN (GLUCOPHAGE) 500 MG tablet  Take 1 tablet (500 mg total) by mouth 2 (two) times daily with a meal. 10/20/21  Yes Elgergawy, Silver Huguenin, MD  methocarbamol (ROBAXIN) 500 MG tablet Take 1 tablet (500 mg total) by mouth every 6 (six) hours as needed for muscle spasms. 10/20/21  Yes Elgergawy, Silver Huguenin, MD  metroNIDAZOLE (FLAGYL) 500 MG tablet Take 1 tablet (500 mg total) by mouth every 12 (twelve) hours for 26 days. Patient taking differently: Take 500 mg by mouth every 12 (twelve) hours. Start date: 10/23/21 10/20/21 11/15/21 Yes Elgergawy, Silver Huguenin, MD  Accu-Chek Softclix Lancets lancets Use As Directed 10/20/21   Elgergawy, Silver Huguenin, MD  Blood Glucose Monitoring Suppl (ACCU-CHEK GUIDE) w/Device KIT Use As Directed 10/20/21   Elgergawy, Silver Huguenin, MD  glucose blood test strip Use As Directed 10/20/21   Elgergawy, Silver Huguenin, MD  Insulin Pen Needle 32G X 4 MM MISC Use As Directed 10/20/21   Elgergawy, Silver Huguenin, MD      Allergies    Patient has no known allergies.    Review of Systems   Review of Systems  Constitutional:  Negative for fever.  Respiratory:  Negative for shortness of breath.   Cardiovascular:  Positive for chest pain.  Gastrointestinal:  Negative for abdominal pain.    Physical Exam Updated Vital Signs BP (!) 159/79   Pulse 70   Temp 98.1 F (36.7 C) (Oral)   Resp 18  Ht 5' 3"  (1.6 m)   Wt 67.1 kg   SpO2 99%   BMI 26.22 kg/m  Physical Exam Vitals and nursing note reviewed.  Constitutional:      General: He is not in acute distress.    Appearance: He is well-developed. He is not ill-appearing or diaphoretic.  HENT:     Head: Normocephalic and atraumatic.  Cardiovascular:     Rate and Rhythm: Normal rate and regular rhythm.     Heart sounds: Normal heart sounds.  Pulmonary:     Effort: Pulmonary effort is normal.     Breath sounds: Normal breath sounds.  Chest:     Chest wall: Mass and tenderness present.    Abdominal:     Palpations: Abdomen is soft.     Tenderness: There is no abdominal tenderness.   Skin:    General: Skin is warm and dry.  Neurological:     Mental Status: He is alert.     ED Results / Procedures / Treatments   Labs (all labs ordered are listed, but only abnormal results are displayed) Labs Reviewed  COMPREHENSIVE METABOLIC PANEL - Abnormal; Notable for the following components:      Result Value   Glucose, Bld 283 (*)    Total Protein 8.3 (*)    All other components within normal limits  CBC WITH DIFFERENTIAL/PLATELET - Abnormal; Notable for the following components:   Platelets 496 (*)    All other components within normal limits  SEDIMENTATION RATE - Abnormal; Notable for the following components:   Sed Rate 69 (*)    All other components within normal limits  CULTURE, BLOOD (ROUTINE X 2)  CULTURE, BLOOD (ROUTINE X 2)  C-REACTIVE PROTEIN    EKG EKG Interpretation  Date/Time:  Wednesday November 04 2021 15:56:12 EDT Ventricular Rate:  81 PR Interval:  156 QRS Duration: 87 QT Interval:  373 QTC Calculation: 433 R Axis:   -60 Text Interpretation: Sinus rhythm Left anterior fascicular block Abnormal R-wave progression, late transition Tachycardia resolved, otherwise similar to October 19 2021 Confirmed by Sherwood Gambler [phone]) on 11/04/2021 4:35:14 PM  Radiology CT Chest W Contrast  Result Date: 11/04/2021 CLINICAL DATA:  Pain and swelling left chest wall. EXAM: CT CHEST WITH CONTRAST TECHNIQUE: Multidetector CT imaging of the chest was performed during intravenous contrast administration. RADIATION DOSE REDUCTION: This exam was performed according to the departmental dose-optimization program which includes automated exposure control, adjustment of the mA and/or kV according to patient size and/or use of iterative reconstruction technique. CONTRAST:  30m OMNIPAQUE IOHEXOL 300 MG/ML  SOLN COMPARISON:  10/15/2021 FINDINGS: Cardiovascular: There is homogeneous enhancement in thoracic aorta. There are no intraluminal filling defects in the pulmonary artery  branches. Mediastinum/Nodes: No significant lymphadenopathy seen in mediastinum. There is soft tissue swelling around the left sternoclavicular joint. There are lytic lesions in the medial end of left clavicle and sternum adjacent to the left sternoclavicular joint. There is no loculated fluid collection. There are no pockets of air in the soft tissues. Lungs/Pleura: There are small linear patchy densities in both parahilar regions and lower lung fields with interval improvement. There are no new focal infiltrates. There is no pleural effusion or pneumothorax. Upper Abdomen: Gallbladder stones are seen. There is no dilation of bile ducts. There is 2.8 cm cyst in the medial upper pole of left kidney. Musculoskeletal: Lytic lesions are seen in the medial end of left clavicle and sternum adjacent to the left sternoclavicular joint. There is soft tissue  swelling around the left sternoclavicular joint without definite loculated fluid collection. IMPRESSION: There is new lytic lesion in the medial end of left clavicle and manubrium sternum adjacent to the left sternoclavicular joint. Findings suggest possible septic arthritis. There is soft tissue swelling around the left sternoclavicular joint without demonstrable drainable abscess. There is no evidence of pulmonary artery embolism. There is no evidence of thoracic aortic dissection. There is interval partial clearing of linear and ground-glass infiltrates in both lungs suggesting resolving pneumonia. No new infiltrates are seen. There is no pleural effusion. Gallbladder stones.  Left renal cyst. Imaging finding of new septic arthritis in the left sternoclavicular joint was relayed to Dr. Regenia Skeeter by telephone call. Electronically Signed   By: Elmer Picker M.D.   On: 11/04/2021 18:01    Procedures Procedures    Medications Ordered in ED Medications  metroNIDAZOLE (FLAGYL) tablet 500 mg (500 mg Oral Given 11/04/21 2045)  ceFAZolin (ANCEF) IVPB 2g/100 mL  premix (has no administration in time range)  lactated ringers bolus 1,000 mL (1,000 mLs Intravenous New Bag/Given 11/04/21 1703)  HYDROmorphone (DILAUDID) injection 1 mg (1 mg Intravenous Given 11/04/21 1704)  iohexol (OMNIPAQUE) 300 MG/ML solution 75 mL (75 mLs Intravenous Contrast Given 11/04/21 1726)    ED Course/ Medical Decision Making/ A&P                           Medical Decision Making Amount and/or Complexity of Data Reviewed External Data Reviewed: labs and notes.    Details: ID note and labs from 2 days ago. Recent D/c summary this month. Labs: ordered.    Details: Thrombocytosis and hyperglycemia.  Sedimentation rate is 69 which is elevated but less than 2 days ago. Radiology: ordered and independent interpretation performed.    Details: Bony breakdown consistent with septic arthritis of the sternoclavicular joint  Risk Prescription drug management. Decision regarding hospitalization.   Patient CT scan shows sternoclavicular septic arthritis.  No abscess.  I discussed these results with Dr. Prescott Gum of CT surgery.  He will follow the patient in consult but recommends IV antibiotics.  No indication for surgical management.  I discussed with infectious disease, Dr. [place] Bali, who recommends blood cultures, ESR and CRP, and IV cefazolin and p.o. metronidazole.  ID will officially consult tomorrow morning.  Discussed with Dr. Ara Kussmaul for admission. Given IV dilaudid for pain.         Final Clinical Impression(s) / ED Diagnoses Final diagnoses:  Septic arthritis of left sternoclavicular joint [facility])    Rx / DC Orders ED Discharge Orders     None         Sherwood Gambler, MD 11/04/21 2152

## 2021-11-04 NOTE — ED Triage Notes (Signed)
Pt was seen at [facility] and had referral to [facility] today for an abscess on his L chest wall. Pt states this has been worsening for the last 20 days. Pt denies fever during this time, no SOB.

## 2021-11-04 NOTE — ED Provider Notes (Signed)
[facility]    CSN: [id number] Arrival date & time: 11/04/21  0943      History   Chief Complaint Chief Complaint  Patient presents with   Chest Pain    HPI Teddy Rebstock is a 51 y.o. male.   Patient presents with area of swelling to left upper chest that has been present for a few weeks.  Patient reports that movement exacerbates pain and it sometimes radiates into left arm/shoulder.  Patient presented to the ED at the end of May for the symptoms as well as for positive blood cultures.  This was due to liver abscess.  An MRI was completed of the left shoulder due to shoulder pain complaint as well which showed tendinosis as well as supraclavicular soft tissue edema.  Patient denies any obvious injury to the area.  He does do a lot of heavy lifting at work but is not sure if this is a attributed to pain.  Denies fever, body aches, chills recently. Has taken tylenol for pain.    Chest Pain   Past Medical History:  Diagnosis Date   Diabetes mellitus without complication ([place])    Hypertension     Patient Active Problem List   Diagnosis Date Noted   Liver abscess 10/19/2021   Pneumonia of both lungs due to Klebsiella pneumoniae ([place]) 10/15/2021   Essential hypertension 10/15/2021   Mixed hyperlipidemia 10/15/2021   Lactic acidosis 10/15/2021   Bacteremia due to Klebsiella pneumoniae 10/14/2021   TINEA CORPORIS 08/22/2008   Uncontrolled type 2 diabetes mellitus with hyperglycemia, without long-term current use of insulin ([place]) 03/07/2008   MICROSCOPIC HEMATURIA 03/07/2008   GLYCOSURIA 03/07/2008   GASTROESOPHAGEAL REFLUX DISEASE 01/25/2008   CONSTIPATION 01/25/2008    Past Surgical History:  Procedure Laterality Date   APPENDECTOMY         Home Medications    Prior to Admission medications   Medication Sig Start Date End Date Taking? Authorizing Provider  Accu-Chek Softclix Lancets lancets Use As Directed 10/20/21   Elgergawy, Silver Huguenin, MD   acetaminophen (TYLENOL) 325 MG tablet Take 2 tablets (650 mg total) by mouth every 6 (six) hours as needed for mild pain or moderate pain (or Fever >/= 101). 10/20/21   Elgergawy, Silver Huguenin, MD  amLODipine (NORVASC) 5 MG tablet Take 1 tablet (5 mg total) by mouth daily. 10/20/21   Elgergawy, Silver Huguenin, MD  atorvastatin (LIPITOR) 20 MG tablet Take 1 tablet (20 mg total) by mouth daily. 10/20/21 10/20/22  Elgergawy, Silver Huguenin, MD  Blood Glucose Monitoring Suppl (ACCU-CHEK GUIDE) w/Device KIT Use As Directed 10/20/21   Elgergawy, Silver Huguenin, MD  cefadroxil (DURICEF) 500 MG capsule Take 2 capsules (1,000 mg total) by mouth 2 (two) times daily for 26 days. 10/20/21 11/15/21  Elgergawy, Silver Huguenin, MD  diclofenac Sodium (VOLTAREN) 1 % GEL Apply 4 g topically 4 (four) times daily. 10/20/21   Elgergawy, Silver Huguenin, MD  glucose blood test strip Use As Directed 10/20/21   Elgergawy, Silver Huguenin, MD  insulin glargine-yfgn (SEMGLEE) 100 UNIT/ML Pen Inject 40 Units into the skin daily. 10/20/21   Elgergawy, Silver Huguenin, MD  Insulin Pen Needle 32G X 4 MM MISC Use As Directed 10/20/21   Elgergawy, Silver Huguenin, MD  metFORMIN (GLUCOPHAGE) 500 MG tablet Take 1 tablet (500 mg total) by mouth 2 (two) times daily with a meal. 10/20/21   Elgergawy, Silver Huguenin, MD  methocarbamol (ROBAXIN) 500 MG tablet Take 1 tablet (500 mg total) by mouth every  6 (six) hours as needed for muscle spasms. 10/20/21   Elgergawy, Silver Huguenin, MD  metroNIDAZOLE (FLAGYL) 500 MG tablet Take 1 tablet (500 mg total) by mouth every 12 (twelve) hours for 26 days. 10/20/21 11/15/21  Elgergawy, Silver Huguenin, MD    Family History Family History  Problem Relation Age of Onset   Healthy Mother    Hypertension Brother    Diabetes Brother    Heart disease Neg Hx     Social History Social History   Tobacco Use   Smoking status: Never  Vaping Use   Vaping Use: Never used  Substance Use Topics   Alcohol use: Yes    Comment: little   Drug use: No     Allergies   Patient has no known  allergies.   Review of Systems Review of Systems Per HPI  Physical Exam Triage Vital Signs ED Triage Vitals  Enc Vitals Group     BP 11/04/21 1027 (!) 150/91     Pulse Rate 11/04/21 1027 88     Resp 11/04/21 1027 20     Temp 11/04/21 1027 98.1 F (36.7 C)     Temp Source 11/04/21 1027 Oral     SpO2 11/04/21 1027 96 %     Weight --      Height --      Head Circumference --      Peak Flow --      Pain Score 11/04/21 1034 10     Pain Loc --      Pain Edu? --      Excl. in [place]? --    No data found.  Updated Vital Signs BP (!) 150/91 (BP Location: Left Arm)   Pulse 88   Temp 98.1 F (36.7 C) (Oral)   Resp 20   SpO2 96%   Visual Acuity Right Eye Distance:   Left Eye Distance:   Bilateral Distance:    Right Eye Near:   Left Eye Near:    Bilateral Near:     Physical Exam Constitutional:      General: He is not in acute distress.    Appearance: Normal appearance. He is not toxic-appearing or diaphoretic.  HENT:     Head: Normocephalic and atraumatic.  Eyes:     Extraocular Movements: Extraocular movements intact.     Conjunctiva/sclera: Conjunctivae normal.  Cardiovascular:     Rate and Rhythm: Normal rate and regular rhythm.     Pulses: Normal pulses.     Heart sounds: Normal heart sounds.  Pulmonary:     Effort: Pulmonary effort is normal. No respiratory distress.     Breath sounds: Normal breath sounds.  Chest:     Chest wall: Swelling and tenderness present.       Comments: Approximately 3 to 4 inch diameter nonmobile, indurated area of swelling present to left upper chest.  No obvious discoloration noted.  No purulent drainage.  No lacerations or abrasions. Musculoskeletal:     Comments: Tenderness to palpation generalized throughout left shoulder.  Patient having difficulty lifting shoulder and has to use other arm to help lift.  Pain occurs with abduction.  Grip strength 5/5.  Neurovascular intact.  No obvious swelling or discoloration noted.   Neurological:     General: No focal deficit present.     Mental Status: He is alert and oriented to person, place, and time. Mental status is at baseline.  Psychiatric:        Mood and Affect: Mood normal.  Behavior: Behavior normal.        Thought Content: Thought content normal.        Judgment: Judgment normal.      UC Treatments / Results  Labs (all labs ordered are listed, but only abnormal results are displayed) Labs Reviewed - No data to display  EKG   Radiology No results found.  Procedures Procedures (including critical care time)  Medications Ordered in UC Medications - No data to display  Initial Impression / Assessment and Plan / UC Course  I have reviewed the triage vital signs and the nursing notes.  Pertinent labs & imaging results that were available during my care of the patient were reviewed by me and considered in my medical decision making (see chart for details).     Recent MRI from ED admission showed: 1. No evidence of septic arthritis or osteomyelitis involving the left shoulder. 2. Mild soft tissue edema within the supraclavicular region at the edge of the field of view, nonspecific. No fluid collections are seen. 3. Mild rotator cuff tendinosis without tear. 4. Intra-articular biceps tendinosis without tenosynovitis.  MRI results that show tendinosis is most likely cause of patient's left upper extremity pain.  Unsure exact etiology of area of swelling to left upper chest although MRI does show some supraclavicular soft tissue edema.  I do think this warrants further imaging such as an ultrasound to determine exact etiology.  Unable to provide this imaging in urgent care due to limited resources.  Patient has PCP appointment on July 3 and encouraged patient to keep this appointment for further evaluation and management.  Patient was also given orthopedic contact information for follow-up for left shoulder pain.  Discussed supportive care  with patient for pain.  Discussed return precautions and ER precautions.  Patient verbalized understanding was agreeable with plan.   Final Clinical Impressions(s) / UC Diagnoses   Final diagnoses:  Chest wall mass     Discharge Instructions      Please follow-up with primary care doctor for further evaluation and management.    ED Prescriptions   None    PDMP not reviewed this encounter.   Teodora Medici, [place] 11/04/21 1135

## 2021-11-04 NOTE — ED Provider Triage Note (Signed)
Emergency Medicine Provider Triage Evaluation Note  Emrah Ariola , a 51 y.o. male  was evaluated in triage.  Pt complains of increasing pain of a mass that is located on the left upper chest near the sternoclavicular joint.   Was recommended to orthopedics by urgent care.  Saw orthopedics this morning, who suspects that there might be an infection and recommended he come to the emergency department for CT imaging.  Denies fevers, shortness of breath, chills, N/V/D, dysphagia, orthopnea.   Review of Systems  Positive:  Negative: See above  Physical Exam  BP (!) 163/92   Pulse 78   Temp 98.2 F (36.8 C) (Oral)   Resp 17   Ht 5\' 3"  (1.6 m)   Wt 67.1 kg   SpO2 99%   BMI 26.22 kg/m  Gen:   Awake, no distress   Resp:  Normal effort, CTAB MSK:   Moves extremities without difficulty  Other:  5-6 cm wide tender cyst or possible abscess located near the left sternoclavicular joint.  Able to swallow without difficulty.  Airway patent.  Afebrile.  Medical Decision Making  Medically screening exam initiated at 4:07 PM.  Appropriate orders placed.  Aniel Hubble was informed that the remainder of the evaluation will be completed by another provider, this initial triage assessment does not replace that evaluation, and the importance of remaining in the ED until their evaluation is complete.     Joeseph Amor, PA-C 11/04/21 1610

## 2021-11-04 NOTE — H&P (Signed)
History and Physical   [facility] [place] [facility] [place] Admission History and Physical [employer], D.O.    Patient Name: Benjamin Thompson MR#: [id number] Date of Birth: 01-14-1971 Date of Admission: 11/04/2021  Referring MD/NP/PA: Dr. Karsten Ro Primary Care Physician: Patient, No Pcp Per  Chief Complaint:  Chief Complaint  Patient presents with   Abscess   Chest Pain    HPI: [name] is a 51 y.o. male with a known history of diabetes, hypertension presents to the emergency department for evaluation of 3-week history of left shoulder pain associated with swelling.  He has been seen by urgent care and orthopedics who referred him to the emergency department for additional imaging and treatment. He complains of left neck, shoulder and anterior chest pain.   Patient was hospitalized here and treated for sepsis secondary to liver abscess.  He is currently on antibiotics at home and has been compliant.  Patient denies fevers/chills, weakness, dizziness, chest pain, shortness of breath, N/V/C/D, abdominal pain, dysuria/frequency, changes in mental status.    Otherwise there has been no change in status. Patient has been taking medication as prescribed and there has been no recent change in medication or diet.  No recent antibiotics.  There has been no recent illness, hospitalizations, travel or sick contacts.    EMS/ED Course: Patient received cefazolin, metronidazole, lactated Ringer's and Dilaudid. Medical admission has been requested for further management of left sternoclavicular joint septic arthritis.  Review of Systems:  CONSTITUTIONAL: No fever/chills, fatigue, weakness, weight gain/loss, headache. EYES: No blurry or double vision. ENT: No tinnitus, postnasal drip, redness or soreness of the oropharynx. RESPIRATORY: No cough, dyspnea, wheeze.  No hemoptysis.  CARDIOVASCULAR: No chest pain, palpitations, syncope, orthopnea. No lower extremity edema.   GASTROINTESTINAL: No nausea, vomiting, abdominal pain, diarrhea, constipation.  No hematemesis, melena or hematochezia. GENITOURINARY: No dysuria, frequency, hematuria. ENDOCRINE: No polyuria or nocturia. No heat or cold intolerance. HEMATOLOGY: No anemia, bruising, bleeding. INTEGUMENTARY: No rashes, ulcers, lesions. MUSCULOSKELETAL: Positive Left shoulder, neck and anterior chest wall pain No arthritis, gout, dyspnea. NEUROLOGIC: No numbness, tingling, ataxia, seizure-type activity, weakness. PSYCHIATRIC: No anxiety, depression, insomnia.   Past Medical History:  Diagnosis Date   Diabetes mellitus without complication ([place])    Hypertension     Past Surgical History:  Procedure Laterality Date   APPENDECTOMY       reports that he has never smoked. He does not have any smokeless tobacco history on file. He reports that he does not currently use alcohol. He reports that he does not use drugs.  No Known Allergies  Family History  Problem Relation Age of Onset   Healthy Mother    Hypertension Brother    Diabetes Brother    Heart disease Neg Hx     Prior to Admission medications   Medication Sig Start Date End Date Taking? Authorizing Provider  Accu-Chek Softclix Lancets lancets Use As Directed 10/20/21   Elgergawy, Silver Huguenin, MD  acetaminophen (TYLENOL) 325 MG tablet Take 2 tablets (650 mg total) by mouth every 6 (six) hours as needed for mild pain or moderate pain (or Fever >/= 101). 10/20/21   Elgergawy, Silver Huguenin, MD  amLODipine (NORVASC) 5 MG tablet Take 1 tablet (5 mg total) by mouth daily. 10/20/21   Elgergawy, Silver Huguenin, MD  atorvastatin (LIPITOR) 20 MG tablet Take 1 tablet (20 mg total) by mouth daily. 10/20/21 10/20/22  Elgergawy, Silver Huguenin, MD  Blood Glucose Monitoring Suppl (ACCU-CHEK GUIDE) w/Device KIT Use As  Directed 10/20/21   Elgergawy, Silver Huguenin, MD  cefadroxil (DURICEF) 500 MG capsule Take 2 capsules (1,000 mg total) by mouth 2 (two) times daily for 26 days. 10/20/21 11/15/21   Elgergawy, Silver Huguenin, MD  diclofenac Sodium (VOLTAREN) 1 % GEL Apply 4 g topically 4 (four) times daily. 10/20/21   Elgergawy, Silver Huguenin, MD  glucose blood test strip Use As Directed 10/20/21   Elgergawy, Silver Huguenin, MD  insulin glargine-yfgn (SEMGLEE) 100 UNIT/ML Pen Inject 40 Units into the skin daily. 10/20/21   Elgergawy, Silver Huguenin, MD  Insulin Pen Needle 32G X 4 MM MISC Use As Directed 10/20/21   Elgergawy, Silver Huguenin, MD  metFORMIN (GLUCOPHAGE) 500 MG tablet Take 1 tablet (500 mg total) by mouth 2 (two) times daily with a meal. 10/20/21   Elgergawy, Silver Huguenin, MD  methocarbamol (ROBAXIN) 500 MG tablet Take 1 tablet (500 mg total) by mouth every 6 (six) hours as needed for muscle spasms. 10/20/21   Elgergawy, Silver Huguenin, MD  metroNIDAZOLE (FLAGYL) 500 MG tablet Take 1 tablet (500 mg total) by mouth every 12 (twelve) hours for 26 days. 10/20/21 11/15/21  Elgergawy, Silver Huguenin, MD    Physical Exam: Vitals:   11/04/21 1557 11/04/21 1703 11/04/21 1900  BP: (!) 163/92 (!) 160/86 (!) 166/104  Pulse: 78 90 73  Resp: 17 18 18   Temp: 98.2 F (36.8 C)    TempSrc: Oral    SpO2: 99% 98% 99%  Weight: 67.1 kg    Height: 5' 3"  (1.6 m)      GENERAL: 51 y.o.-year-old Spanish-speaking male patient, well-developed, well-nourished lying in the bed in no acute distress.  Pleasant and cooperative.   HEENT: Head atraumatic, normocephalic. Pupils equal. Mucus membranes moist. NECK: Supple. No JVD. CHEST: Left chest wall mass warm and tender at the sternoclavicular joint.  Normal breath sounds bilaterally. No wheezing, rales, rhonchi or crackles. No use of accessory muscles of respiration.  No reproducible chest wall tenderness.  CARDIOVASCULAR: S1, S2 normal. No murmurs, rubs, or gallops. Cap refill <2 seconds. Pulses intact distally.  ABDOMEN: Soft, nondistended, nontender. No rebound, guarding, rigidity. Normoactive bowel sounds present in all four quadrants.  EXTREMITIES: No pedal edema, cyanosis, or clubbing. No calf tenderness  or Homan's sign.  NEUROLOGIC: The patient is alert and oriented x 3. Cranial nerves II through XII are grossly intact with no focal sensorimotor deficit. PSYCHIATRIC:  Normal affect, mood, thought content. SKIN: Warm, dry, and intact without obvious rash, lesion, or ulcer.    Labs on Admission:  CBC: Recent Labs  Lab 11/02/21 1139 11/04/21 1627  WBC 8.8 7.6  NEUTROABS 6,450 5.0  HGB 14.8 14.7  HCT 44.6 46.3  MCV 85.1 88.5  PLT 554* 737*   Basic Metabolic Panel: Recent Labs  Lab 11/02/21 1139 11/04/21 1627  NA 140 140  K 4.1 4.3  CL 102 103  CO2 29 30  GLUCOSE 149* 283*  BUN 12 14  CREATININE 0.67 0.69  CALCIUM 9.7 9.6   GFR: Estimated Creatinine Clearance: 89.9 mL/min (by C-G formula based on SCr of 0.69 mg/dL). Liver Function Tests: Recent Labs  Lab 11/02/21 1139 11/04/21 1627  AST 16 17  ALT 20 24  ALKPHOS  --  71  BILITOT 0.3 0.5  PROT 7.4 8.3*  ALBUMIN  --  3.5   No results for input(s): "LIPASE", "AMYLASE" in the last 168 hours. No results for input(s): "AMMONIA" in the last 168 hours. Coagulation Profile: No results for input(s): "INR", "PROTIME"  in the last 168 hours. Cardiac Enzymes: No results for input(s): "CKTOTAL", "CKMB", "CKMBINDEX", "TROPONINI" in the last 168 hours. BNP (last 3 results) No results for input(s): "PROBNP" in the last 8760 hours. HbA1C: No results for input(s): "HGBA1C" in the last 72 hours. CBG: No results for input(s): "GLUCAP" in the last 168 hours. Lipid Profile: No results for input(s): "CHOL", "HDL", "LDLCALC", "TRIG", "CHOLHDL", "LDLDIRECT" in the last 72 hours. Thyroid Function Tests: No results for input(s): "TSH", "T4TOTAL", "FREET4", "T3FREE", "THYROIDAB" in the last 72 hours. Anemia Panel: No results for input(s): "VITAMINB12", "FOLATE", "FERRITIN", "TIBC", "IRON", "RETICCTPCT" in the last 72 hours. Urine analysis:    Component Value Date/Time   COLORURINE YELLOW 10/15/2021 0010   APPEARANCEUR CLEAR  10/15/2021 0010   LABSPEC 1.016 10/15/2021 0010   PHURINE 6.0 10/15/2021 0010   GLUCOSEU >=500 (A) 10/15/2021 0010   HGBUR SMALL (A) 10/15/2021 0010   HGBUR negative 07/05/2008 1138   BILIRUBINUR NEGATIVE 10/15/2021 0010   KETONESUR 80 (A) 10/15/2021 0010   PROTEINUR NEGATIVE 10/15/2021 0010   UROBILINOGEN 0.2 07/05/2008 1138   NITRITE NEGATIVE 10/15/2021 0010   LEUKOCYTESUR NEGATIVE 10/15/2021 0010   Sepsis Labs: @LABRCNTIP (procalcitonin:4,lacticidven:4) )No results found for this or any previous visit (from the past 240 hour(s)).   Radiological Exams on Admission: CT Chest W Contrast  Result Date: 11/04/2021 CLINICAL DATA:  Pain and swelling left chest wall. EXAM: CT CHEST WITH CONTRAST TECHNIQUE: Multidetector CT imaging of the chest was performed during intravenous contrast administration. RADIATION DOSE REDUCTION: This exam was performed according to the departmental dose-optimization program which includes automated exposure control, adjustment of the mA and/or kV according to patient size and/or use of iterative reconstruction technique. CONTRAST:  66m OMNIPAQUE IOHEXOL 300 MG/ML  SOLN COMPARISON:  10/15/2021 FINDINGS: Cardiovascular: There is homogeneous enhancement in thoracic aorta. There are no intraluminal filling defects in the pulmonary artery branches. Mediastinum/Nodes: No significant lymphadenopathy seen in mediastinum. There is soft tissue swelling around the left sternoclavicular joint. There are lytic lesions in the medial end of left clavicle and sternum adjacent to the left sternoclavicular joint. There is no loculated fluid collection. There are no pockets of air in the soft tissues. Lungs/Pleura: There are small linear patchy densities in both parahilar regions and lower lung fields with interval improvement. There are no new focal infiltrates. There is no pleural effusion or pneumothorax. Upper Abdomen: Gallbladder stones are seen. There is no dilation of bile ducts.  There is 2.8 cm cyst in the medial upper pole of left kidney. Musculoskeletal: Lytic lesions are seen in the medial end of left clavicle and sternum adjacent to the left sternoclavicular joint. There is soft tissue swelling around the left sternoclavicular joint without definite loculated fluid collection. IMPRESSION: There is new lytic lesion in the medial end of left clavicle and manubrium sternum adjacent to the left sternoclavicular joint. Findings suggest possible septic arthritis. There is soft tissue swelling around the left sternoclavicular joint without demonstrable drainable abscess. There is no evidence of pulmonary artery embolism. There is no evidence of thoracic aortic dissection. There is interval partial clearing of linear and ground-glass infiltrates in both lungs suggesting resolving pneumonia. No new infiltrates are seen. There is no pleural effusion. Gallbladder stones.  Left renal cyst. Imaging finding of new septic arthritis in the left sternoclavicular joint was relayed to Dr. GRegenia Skeeterby telephone call. Electronically Signed   By: PElmer PickerM.D.   On: 11/04/2021 18:01      Assessment/Plan  This is a  51 y.o. male with a history of diabetes, hypertension now being admitted with:  #.  Septic arthritis of the left sternoclavicular joint - Admit inpatient - Continue IV antibiotics - Orthopedic and infectious disease consult -Continue IV cefazolin, p.o. Flagyl - ESR and CRP per ID recommendations - Pain control  #. H/o Diabetes - Accuchecks achs with RISS coverage - Heart healthy, carb controlled diet  #.  History of hypertension -Continue Norvasc  #. History of hyperlipidemia - Continue Lipitor  Admission status: Inpatient IV Fluids: Normal saline Diet/Nutrition: Heart healthy, carb controlled Consults called: Ortho and ID DVT Px: SCDs and early ambulation. Code Status: Full Code  Disposition Plan: To home in 1-2 days  All the records are reviewed and  case discussed with ED provider. Management plans discussed with the patient and/or family who express understanding and agree with plan of care.  Arnell Slivinski D.O. on 11/04/2021 at 7:22 PM CC: Primary care physician; Patient, No Pcp Per   11/04/2021, 7:22 PM

## 2021-11-04 NOTE — Discharge Instructions (Addendum)
Please follow-up with primary care doctor for further evaluation and management.

## 2021-11-05 ENCOUNTER — Inpatient Hospital Stay (HOSPITAL_COMMUNITY): Payer: Self-pay

## 2021-11-05 DIAGNOSIS — I10 Essential (primary) hypertension: Secondary | ICD-10-CM

## 2021-11-05 DIAGNOSIS — E1165 Type 2 diabetes mellitus with hyperglycemia: Secondary | ICD-10-CM

## 2021-11-05 DIAGNOSIS — M542 Cervicalgia: Secondary | ICD-10-CM

## 2021-11-05 DIAGNOSIS — M86112 Other acute osteomyelitis, left shoulder: Secondary | ICD-10-CM

## 2021-11-05 DIAGNOSIS — E119 Type 2 diabetes mellitus without complications: Secondary | ICD-10-CM

## 2021-11-05 DIAGNOSIS — R7881 Bacteremia: Secondary | ICD-10-CM

## 2021-11-05 LAB — COMPREHENSIVE METABOLIC PANEL
ALT: 21 U/L (ref 0–44)
AST: 20 U/L (ref 15–41)
Albumin: 3.1 g/dL — ABNORMAL LOW (ref 3.5–5.0)
Alkaline Phosphatase: 59 U/L (ref 38–126)
Anion gap: 6 (ref 5–15)
BUN: 9 mg/dL (ref 6–20)
CO2: 29 mmol/L (ref 22–32)
Calcium: 8.4 mg/dL — ABNORMAL LOW (ref 8.9–10.3)
Chloride: 102 mmol/L (ref 98–111)
Creatinine, Ser: 0.71 mg/dL (ref 0.61–1.24)
GFR, Estimated: >60 mL/min (ref >60)
Glucose, Bld: 210 mg/dL — ABNORMAL HIGH (ref 70–99)
Potassium: 3.8 mmol/L (ref 3.5–5.1)
Sodium: 137 mmol/L (ref 135–145)
Total Bilirubin: 0.5 mg/dL (ref 0.3–1.2)
Total Protein: 7.3 g/dL (ref 6.5–8.1)

## 2021-11-05 LAB — CBC
HCT: 42 % (ref 39.0–52.0)
Hemoglobin: 13.6 g/dL (ref 13.0–17.0)
MCH: 28.5 pg (ref 26.0–34.0)
MCHC: 32.4 g/dL (ref 30.0–36.0)
MCV: 88.1 fL (ref 80.0–100.0)
Platelets: 412 10*3/uL — ABNORMAL HIGH (ref 150–400)
RBC: 4.77 MIL/uL (ref 4.22–5.81)
RDW: 13.2 % (ref 11.5–15.5)
WBC: 6.9 10*3/uL (ref 4.0–10.5)
nRBC: 0 % (ref 0.0–0.2)

## 2021-11-05 LAB — SURGICAL PCR SCREEN
MRSA, PCR: NEGATIVE
Staphylococcus aureus: NEGATIVE

## 2021-11-05 LAB — URINALYSIS, ROUTINE W REFLEX MICROSCOPIC
Bilirubin Urine: NEGATIVE
Glucose, UA: NEGATIVE mg/dL
Hgb urine dipstick: NEGATIVE
Ketones, ur: NEGATIVE mg/dL
Leukocytes,Ua: NEGATIVE
Nitrite: NEGATIVE
Protein, ur: NEGATIVE mg/dL
Specific Gravity, Urine: 1.013 (ref 1.005–1.030)
pH: 7 (ref 5.0–8.0)

## 2021-11-05 LAB — TYPE AND SCREEN
ABO/RH(D): A POS
Antibody Screen: NEGATIVE

## 2021-11-05 LAB — PROTIME-INR
INR: 1.1 (ref 0.8–1.2)
INR: 1.1 (ref 0.8–1.2)
Prothrombin Time: 13.7 seconds (ref 11.4–15.2)
Prothrombin Time: 13.6 seconds (ref 11.4–15.2)

## 2021-11-05 LAB — GLUCOSE, CAPILLARY
Glucose-Capillary: 139 mg/dL — ABNORMAL HIGH (ref 70–99)
Glucose-Capillary: 202 mg/dL — ABNORMAL HIGH (ref 70–99)

## 2021-11-05 LAB — APTT
aPTT: 34 seconds (ref 24–36)
aPTT: 37 seconds — ABNORMAL HIGH (ref 24–36)

## 2021-11-05 LAB — ABO/RH: ABO/RH(D): A POS

## 2021-11-05 LAB — CBG MONITORING, ED
Glucose-Capillary: 85 mg/dL (ref 70–99)
Glucose-Capillary: 212 mg/dL — ABNORMAL HIGH (ref 70–99)

## 2021-11-05 MED ORDER — CEFAZOLIN SODIUM-DEXTROSE 2-4 GM/100ML-% IV SOLN
2.0000 g | Freq: Once | INTRAVENOUS | Status: DC
Start: 2021-11-05 — End: 2021-11-05
  Filled 2021-11-05: qty 100

## 2021-11-05 MED ORDER — HYDROCODONE-ACETAMINOPHEN 5-325 MG PO TABS
1.0000 | ORAL_TABLET | ORAL | Status: DC | PRN
  Administered 2021-11-05: 1 via ORAL
  Administered 2021-11-06 – 2021-11-16 (×20): 2 via ORAL
  Filled 2021-11-05: qty 2
  Filled 2021-11-05: qty 1
  Filled 2021-11-05 (×19): qty 2

## 2021-11-05 MED ORDER — ZOLPIDEM TARTRATE 5 MG PO TABS
5.0000 mg | ORAL_TABLET | Freq: Every evening | ORAL | Status: DC | PRN
  Administered 2021-11-05: 5 mg via ORAL
  Filled 2021-11-05: qty 1

## 2021-11-05 MED ORDER — SENNOSIDES-DOCUSATE SODIUM 8.6-50 MG PO TABS: 1.0000 | ORAL_TABLET | Freq: Every evening | ORAL | Status: DC | PRN

## 2021-11-05 MED ORDER — INSULIN ASPART 100 UNIT/ML IJ SOLN
0.0000 [IU] | Freq: Three times a day (TID) | INTRAMUSCULAR | Status: DC
  Administered 2021-11-05: 5 [IU] via SUBCUTANEOUS
  Administered 2021-11-05: 2 [IU] via SUBCUTANEOUS
  Administered 2021-11-07: 3 [IU] via SUBCUTANEOUS
  Administered 2021-11-07: 2 [IU] via SUBCUTANEOUS
  Administered 2021-11-08: 3 [IU] via SUBCUTANEOUS
  Administered 2021-11-08: 5 [IU] via SUBCUTANEOUS
  Administered 2021-11-09: 3 [IU] via SUBCUTANEOUS
  Administered 2021-11-09: 5 [IU] via SUBCUTANEOUS
  Administered 2021-11-10 – 2021-11-12 (×2): 3 [IU] via SUBCUTANEOUS
  Administered 2021-11-13 – 2021-11-14 (×3): 2 [IU] via SUBCUTANEOUS
  Administered 2021-11-15: 3 [IU] via SUBCUTANEOUS
  Administered 2021-11-16: 2 [IU] via SUBCUTANEOUS
  Filled 2021-11-05: qty 0.15

## 2021-11-05 MED ORDER — MORPHINE SULFATE (PF) 2 MG/ML IV SOLN: 1.0000 mg | INTRAVENOUS | Status: DC | PRN

## 2021-11-05 MED ORDER — ALBUTEROL SULFATE (2.5 MG/3ML) 0.083% IN NEBU: 2.5000 mg | INHALATION_SOLUTION | Freq: Four times a day (QID) | RESPIRATORY_TRACT | Status: DC | PRN

## 2021-11-05 MED ORDER — ONDANSETRON HCL 4 MG PO TABS: 4.0000 mg | ORAL_TABLET | Freq: Four times a day (QID) | ORAL | Status: DC | PRN

## 2021-11-05 MED ORDER — METHOCARBAMOL 1000 MG/10ML IJ SOLN: 500.0000 mg | Freq: Four times a day (QID) | INTRAVENOUS | Status: DC | PRN

## 2021-11-05 MED ORDER — BISACODYL 5 MG PO TBEC: 5.0000 mg | DELAYED_RELEASE_TABLET | Freq: Every day | ORAL | Status: DC | PRN

## 2021-11-05 MED ORDER — ATORVASTATIN CALCIUM 10 MG PO TABS
20.0000 mg | ORAL_TABLET | Freq: Every day | ORAL | Status: DC
  Administered 2021-11-05 – 2021-11-16 (×12): 20 mg via ORAL
  Filled 2021-11-05 (×12): qty 2

## 2021-11-05 MED ORDER — HYDRALAZINE HCL 20 MG/ML IJ SOLN: 5.0000 mg | Freq: Three times a day (TID) | INTRAMUSCULAR | Status: DC | PRN

## 2021-11-05 MED ORDER — SODIUM CHLORIDE 0.9 % IV SOLN
2.0000 g | INTRAVENOUS | Status: AC
  Administered 2021-11-06: 2 g via INTRAVENOUS
  Filled 2021-11-05 (×2): qty 2

## 2021-11-05 MED ORDER — TRAMADOL HCL 50 MG PO TABS: 50.0000 mg | ORAL_TABLET | Freq: Four times a day (QID) | ORAL | Status: DC | PRN

## 2021-11-05 MED ORDER — CEFAZOLIN SODIUM-DEXTROSE 2-4 GM/100ML-% IV SOLN
2.0000 g | Freq: Three times a day (TID) | INTRAVENOUS | Status: DC
  Administered 2021-11-05 – 2021-11-16 (×34): 2 g via INTRAVENOUS
  Filled 2021-11-05 (×39): qty 100

## 2021-11-05 MED ORDER — INSULIN ASPART 100 UNIT/ML IJ SOLN
0.0000 [IU] | Freq: Every day | INTRAMUSCULAR | Status: DC
  Administered 2021-11-05 – 2021-11-10 (×3): 2 [IU] via SUBCUTANEOUS
  Filled 2021-11-05: qty 0.05

## 2021-11-05 MED ORDER — METRONIDAZOLE 500 MG PO TABS
500.0000 mg | ORAL_TABLET | Freq: Two times a day (BID) | ORAL | Status: DC
  Administered 2021-11-05 – 2021-11-10 (×11): 500 mg via ORAL
  Filled 2021-11-05 (×11): qty 1

## 2021-11-05 MED ORDER — INSULIN GLARGINE-YFGN 100 UNIT/ML ~~LOC~~ SOLN
40.0000 [IU] | Freq: Every day | SUBCUTANEOUS | Status: DC
Start: 2021-11-05 — End: 2021-11-09
  Administered 2021-11-05 – 2021-11-08 (×4): 40 [IU] via SUBCUTANEOUS
  Filled 2021-11-05 (×5): qty 0.4

## 2021-11-05 MED ORDER — ACETAMINOPHEN 325 MG PO TABS
650.0000 mg | ORAL_TABLET | Freq: Four times a day (QID) | ORAL | Status: DC | PRN
  Administered 2021-11-06 – 2021-11-13 (×2): 650 mg via ORAL
  Filled 2021-11-05 (×2): qty 2

## 2021-11-05 MED ORDER — ONDANSETRON HCL 4 MG/2ML IJ SOLN: 4.0000 mg | Freq: Four times a day (QID) | INTRAMUSCULAR | Status: DC | PRN

## 2021-11-05 MED ORDER — AMLODIPINE BESYLATE 5 MG PO TABS
5.0000 mg | ORAL_TABLET | Freq: Every day | ORAL | Status: DC
  Administered 2021-11-05: 5 mg via ORAL
  Filled 2021-11-05: qty 1

## 2021-11-05 MED ORDER — IPRATROPIUM BROMIDE 0.02 % IN SOLN: 0.5000 mg | Freq: Four times a day (QID) | RESPIRATORY_TRACT | Status: DC | PRN

## 2021-11-05 MED ORDER — MORPHINE SULFATE (PF) 2 MG/ML IV SOLN
1.0000 mg | Freq: Four times a day (QID) | INTRAVENOUS | Status: DC | PRN
  Administered 2021-11-05 – 2021-11-06 (×2): 1 mg via INTRAVENOUS
  Filled 2021-11-05 (×2): qty 1

## 2021-11-05 MED ORDER — METRONIDAZOLE 500 MG PO TABS: 500.0000 mg | ORAL_TABLET | Freq: Three times a day (TID) | ORAL | Status: DC

## 2021-11-05 MED ORDER — ACETAMINOPHEN 650 MG RE SUPP: 650.0000 mg | Freq: Four times a day (QID) | RECTAL | Status: DC | PRN

## 2021-11-05 MED ORDER — SODIUM CHLORIDE 0.9 % IV SOLN: INTRAVENOUS | Status: DC

## 2021-11-05 MED ORDER — INSULIN GLARGINE-YFGN 100 UNIT/ML ~~LOC~~ SOPN: 40.0000 [IU] | PEN_INJECTOR | Freq: Every day | SUBCUTANEOUS | Status: DC

## 2021-11-05 MED ORDER — IOHEXOL 300 MG/ML  SOLN
75.0000 mL | Freq: Once | INTRAMUSCULAR | Status: AC | PRN
  Administered 2021-11-05: 75 mL via INTRAVENOUS

## 2021-11-05 NOTE — Plan of Care (Signed)
  Problem: Education: Goal: Knowledge of General Education information will improve Description: Including pain rating scale, medication(s)/side effects and non-pharmacologic comfort measures Outcome: Progressing   Problem: Health Behavior/Discharge Planning: Goal: Ability to manage health-related needs will improve Outcome: Progressing   Problem: Clinical Measurements: Goal: Ability to maintain clinical measurements within normal limits will improve Outcome: Progressing Goal: Will remain free from infection Outcome: Progressing Goal: Diagnostic test results will improve Outcome: Progressing Goal: Respiratory complications will improve Outcome: Progressing Goal: Cardiovascular complication will be avoided Outcome: Progressing   Problem: Activity: Goal: Risk for activity intolerance will decrease Outcome: Progressing   Problem: Nutrition: Goal: Adequate nutrition will be maintained Outcome: Progressing   Problem: Coping: Goal: Level of anxiety will decrease Outcome: Progressing   Problem: Elimination: Goal: Will not experience complications related to bowel motility Outcome: Progressing Goal: Will not experience complications related to urinary retention Outcome: Progressing   Problem: Pain Managment: Goal: General experience of comfort will improve Outcome: Progressing   Problem: Safety: Goal: Ability to remain free from injury will improve Outcome: Progressing   Problem: Skin Integrity: Goal: Risk for impaired skin integrity will decrease Outcome: Progressing   Problem: Education: Goal: Ability to describe self-care measures that may prevent or decrease complications (Diabetes Survival Skills Education) will improve Outcome: Progressing Goal: Individualized Educational Video(s) Outcome: Progressing   Problem: Coping: Goal: Ability to adjust to condition or change in health will improve Outcome: Progressing   Problem: Fluid Volume: Goal: Ability to  maintain a balanced intake and output will improve Outcome: Progressing   Problem: Health Behavior/Discharge Planning: Goal: Ability to identify and utilize available resources and services will improve Outcome: Progressing Goal: Ability to manage health-related needs will improve Outcome: Progressing   Problem: Metabolic: Goal: Ability to maintain appropriate glucose levels will improve Outcome: Progressing   Problem: Nutritional: Goal: Maintenance of adequate nutrition will improve Outcome: Progressing Goal: Progress toward achieving an optimal weight will improve Outcome: Progressing   Problem: Skin Integrity: Goal: Risk for impaired skin integrity will decrease Outcome: Progressing   Problem: Tissue Perfusion: Goal: Adequacy of tissue perfusion will improve Outcome: Progressing   Problem: Fluid Volume: Goal: Hemodynamic stability will improve Outcome: Progressing   Problem: Clinical Measurements: Goal: Diagnostic test results will improve Outcome: Progressing Goal: Signs and symptoms of infection will decrease Outcome: Progressing   Problem: Respiratory: Goal: Ability to maintain adequate ventilation will improve Outcome: Progressing   

## 2021-11-05 NOTE — Progress Notes (Signed)
PROGRESS NOTE    Benjamin Thompson  XYB:[id number] DOB: [birth year] DOA: 11/04/2021 PCP: Patient, No Pcp Per   Brief Narrative:  HPI: Benjamin Thompson is a 51 y.o. male with a known history of diabetes, hypertension presents to the emergency department for evaluation of 3-week history of left shoulder pain associated with swelling.  He has been seen by urgent care and orthopedics who referred him to the emergency department for additional imaging and treatment. He complains of left neck, shoulder and anterior chest pain.    Patient was hospitalized here and treated for sepsis secondary to liver abscess.  He is currently on antibiotics at home and has been compliant.   Patient denies fevers/chills, weakness, dizziness, chest pain, shortness of breath, N/V/C/D, abdominal pain, dysuria/frequency, changes in mental status.    Otherwise there has been no change in status. Patient has been taking medication as prescribed and there has been no recent change in medication or diet.  No recent antibiotics.  There has been no recent illness, hospitalizations, travel or sick contacts.     EMS/ED Course: Patient received cefazolin, metronidazole, lactated Ringer's and Dilaudid. Medical admission has been requested for further management of left sternoclavicular joint septic arthritis.  Assessment & Plan:   Principal Problem:   Septic arthritis of sternoclavicular joint, left (HCC) Active Problems:   Bacteremia due to Klebsiella pneumoniae   Uncontrolled type 2 diabetes mellitus with hyperglycemia, without long-term current use of insulin (HCC)   Essential hypertension  Septic arthritis of the left sternoclavicular joint: Continue current IV antibiotics.  ID and orthopedic to see.  Management per them.  Uncontrolled type 2 diabetes mellitus with hyperglycemia: Hemoglobin A1c 13.2 on 10/15/2021.  Takes 40 units of Lantus at home which is continued.  Continue SSI.  Essential hypertension: Slightly elevated.   Continue Norvasc.  Hyperlipidemia: Continue Lipitor.  Klebsiella pneumonia bacteremia: Continue cefazolin and Flagyl.  Defer further management to ID.  DVT prophylaxis: SCDs Start: 11/05/21 0313   Code Status: Full Code  Family Communication: Wife present at bedside.  Plan of care discussed with patient in length and he/she verbalized understanding and agreed with it.  Status is: Inpatient Remains inpatient appropriate because: To be evaluated by ID and orthopedics.   Estimated body mass index is 26.22 kg/m as calculated from the following:   Height as of this encounter: 5\' 3"  (1.6 m).   Weight as of this encounter: 67.1 kg.    Nutritional Assessment: Body mass index is 26.22 kg/m. Seen by dietician.  I agree with the assessment and plan as outlined below: Nutrition Status:        . Skin Assessment: I have examined the patient's skin and I agree with the wound assessment as performed by the wound care RN as outlined below:    Consultants:  Orthopedics ID Procedures:  None  Antimicrobials:  Anti-infectives (From admission, onward)    Start     Dose/Rate Route Frequency Ordered Stop   11/05/21 0900  metroNIDAZOLE (FLAGYL) tablet 500 mg        500 mg Oral Every 12 hours 11/05/21 0323     11/05/21 0600  metroNIDAZOLE (FLAGYL) tablet 500 mg  Status:  Discontinued        500 mg Oral Every 8 hours 11/05/21 0312 11/05/21 0323   11/05/21 0500  ceFAZolin (ANCEF) IVPB 2g/100 mL premix        2 g 200 mL/hr over 30 Minutes Intravenous Every 8 hours 11/05/21 0326     11/05/21 0315  ceFAZolin (ANCEF) IVPB 2g/100 mL premix  Status:  Discontinued        2 g 200 mL/hr over 30 Minutes Intravenous  Once 11/05/21 0312 11/05/21 0326   11/04/21 2000  ceFAZolin (ANCEF) IVPB 2g/100 mL premix        2 g 200 mL/hr over 30 Minutes Intravenous  Once 11/04/21 1949 11/04/21 2153   11/04/21 1945  metroNIDAZOLE (FLAGYL) tablet 500 mg  Status:  Discontinued        500 mg Oral Every 12 hours  11/04/21 1930 11/05/21 8756         Subjective: Patient seen and examined in the ED.  Complains of left shoulder pain.  Has restricted range of motion.  No other complaint.  Objective: Vitals:   11/05/21 0900 11/05/21 0930 11/05/21 1025 11/05/21 1100  BP: (!) 164/104 (!) 148/102 (!) 108/93 (!) 154/93  Pulse: 85 79 77 76  Resp:   17 12  Temp:    98 F (36.7 C)  TempSrc:      SpO2: 97% 96% 97% 96%  Weight:      Height:        Intake/Output Summary (Last 24 hours) at 11/05/2021 1116 Last data filed at 11/04/2021 2153 Gross per 24 hour  Intake 1100 ml  Output --  Net 1100 ml   Filed Weights   11/04/21 1557  Weight: 67.1 kg    Examination:  General exam: Appears calm and comfortable  Respiratory system: Clear to auscultation. Respiratory effort normal. Cardiovascular system: S1 & S2 heard, RRR. No JVD, murmurs, rubs, gallops or clicks. No pedal edema. Gastrointestinal system: Abdomen is nondistended, soft and nontender. No organomegaly or masses felt. Normal bowel sounds heard. Central nervous system: Alert and oriented. No focal neurological deficits. Extremities: Restricted range of motion in the left arm/shoulder. Skin: No rashes, lesions or ulcers Psychiatry: Judgement and insight appear normal. Mood & affect appropriate.    Data Reviewed: I have personally reviewed following labs and imaging studies  CBC: Recent Labs  Lab 11/02/21 1139 11/04/21 1627 11/05/21 0941  WBC 8.8 7.6 6.9  NEUTROABS 6,450 5.0  --   HGB 14.8 14.7 13.6  HCT 44.6 46.3 42.0  MCV 85.1 88.5 88.1  PLT 554* 496* 412*   Basic Metabolic Panel: Recent Labs  Lab 11/02/21 1139 11/04/21 1627 11/05/21 0941  NA 140 140 137  K 4.1 4.3 3.8  CL 102 103 102  CO2 29 30 29   GLUCOSE 149* 283* 210*  BUN 12 14 9   CREATININE 0.67 0.69 0.71  CALCIUM 9.7 9.6 8.4*   GFR: Estimated Creatinine Clearance: 89.9 mL/min (by C-G formula based on SCr of 0.71 mg/dL). Liver Function Tests: Recent Labs   Lab 11/02/21 1139 11/04/21 1627 11/05/21 0941  AST 16 17 20   ALT 20 24 21   ALKPHOS  --  71 59  BILITOT 0.3 0.5 0.5  PROT 7.4 8.3* 7.3  ALBUMIN  --  3.5 3.1*   No results for input(s): "LIPASE", "AMYLASE" in the last 168 hours. No results for input(s): "AMMONIA" in the last 168 hours. Coagulation Profile: Recent Labs  Lab 11/05/21 0941  INR 1.1   Cardiac Enzymes: No results for input(s): "CKTOTAL", "CKMB", "CKMBINDEX", "TROPONINI" in the last 168 hours. BNP (last 3 results) No results for input(s): "PROBNP" in the last 8760 hours. HbA1C: No results for input(s): "HGBA1C" in the last 72 hours. CBG: Recent Labs  Lab 11/05/21 0742  GLUCAP 85   Lipid Profile: No results for input(s): "  CHOL", "HDL", "LDLCALC", "TRIG", "CHOLHDL", "LDLDIRECT" in the last 72 hours. Thyroid Function Tests: No results for input(s): "TSH", "T4TOTAL", "FREET4", "T3FREE", "THYROIDAB" in the last 72 hours. Anemia Panel: No results for input(s): "VITAMINB12", "FOLATE", "FERRITIN", "TIBC", "IRON", "RETICCTPCT" in the last 72 hours. Sepsis Labs: No results for input(s): "PROCALCITON", "LATICACIDVEN" in the last 168 hours.  Recent Results (from the past 240 hour(s))  Culture, blood (routine x 2)     Status: None (Preliminary result)   Collection Time: 11/04/21  7:00 PM   Specimen: BLOOD  Result Value Ref Range Status   Specimen Description   Final    BLOOD LEFT ANTECUBITAL Performed at [facility], [address] [place], [place] [address]    Special Requests   Final    BOTTLES DRAWN AEROBIC AND ANAEROBIC Blood Culture adequate volume Performed at [facility], [address], [place], [place] [address]    Culture   Final    NO GROWTH < 12 HOURS Performed at [facility], [address], [place], [place] [address]    Report Status PENDING  Incomplete  Culture, blood (routine x 2)     Status: None (Preliminary result)   Collection Time: 11/04/21   7:10 PM   Specimen: BLOOD  Result Value Ref Range Status   Specimen Description   Final    BLOOD RIGHT ANTECUBITAL Performed at [facility], [address] [place], [place] [address]    Special Requests   Final    BOTTLES DRAWN AEROBIC AND ANAEROBIC Blood Culture adequate volume Performed at [facility], [address] [place], [place] [address]    Culture   Final    NO GROWTH < 12 HOURS Performed at [facility], [address], [place], [place] [address]    Report Status PENDING  Incomplete     Radiology Studies: CT Chest W Contrast  Result Date: 11/04/2021 CLINICAL DATA:  Pain and swelling left chest wall. EXAM: CT CHEST WITH CONTRAST TECHNIQUE: Multidetector CT imaging of the chest was performed during intravenous contrast administration. RADIATION DOSE REDUCTION: This exam was performed according to the departmental dose-optimization program which includes automated exposure control, adjustment of the mA and/or kV according to patient size and/or use of iterative reconstruction technique. CONTRAST:  101mL OMNIPAQUE IOHEXOL 300 MG/ML  SOLN COMPARISON:  10/15/2021 FINDINGS: Cardiovascular: There is homogeneous enhancement in thoracic aorta. There are no intraluminal filling defects in the pulmonary artery branches. Mediastinum/Nodes: No significant lymphadenopathy seen in mediastinum. There is soft tissue swelling around the left sternoclavicular joint. There are lytic lesions in the medial end of left clavicle and sternum adjacent to the left sternoclavicular joint. There is no loculated fluid collection. There are no pockets of air in the soft tissues. Lungs/Pleura: There are small linear patchy densities in both parahilar regions and lower lung fields with interval improvement. There are no new focal infiltrates. There is no pleural effusion or pneumothorax. Upper Abdomen: Gallbladder stones are seen. There is no dilation of bile ducts. There  is 2.8 cm cyst in the medial upper pole of left kidney. Musculoskeletal: Lytic lesions are seen in the medial end of left clavicle and sternum adjacent to the left sternoclavicular joint. There is soft tissue swelling around the left sternoclavicular joint without definite loculated fluid collection. IMPRESSION: There is new lytic lesion in the medial end of left clavicle and manubrium sternum adjacent to the left sternoclavicular joint. Findings suggest possible septic arthritis.  There is soft tissue swelling around the left sternoclavicular joint without demonstrable drainable abscess. There is no evidence of pulmonary artery embolism. There is no evidence of thoracic aortic dissection. There is interval partial clearing of linear and ground-glass infiltrates in both lungs suggesting resolving pneumonia. No new infiltrates are seen. There is no pleural effusion. Gallbladder stones.  Left renal cyst. Imaging finding of new septic arthritis in the left sternoclavicular joint was relayed to Dr. Criss Alvine by telephone call. Electronically Signed   By: Ernie Avena M.D.   On: 11/04/2021 18:01    Scheduled Meds:  amLODipine  5 mg Oral Daily   atorvastatin  20 mg Oral Daily   insulin aspart  0-15 Units Subcutaneous TID WC   insulin aspart  0-5 Units Subcutaneous QHS   insulin glargine-yfgn  40 Units Subcutaneous Daily   metroNIDAZOLE  500 mg Oral Q12H   Continuous Infusions:  sodium chloride 100 mL/hr at 11/05/21 0323    ceFAZolin (ANCEF) IV Stopped (11/05/21 3295)   methocarbamol (ROBAXIN) IV       LOS: 1 day   Hughie Closs, MD [facility]  11/05/2021, 11:16 AM   *Please note that this is a verbal dictation therefore any spelling or grammatical errors are due to the "[employer]agon [employer] One" system interpretation.  Please page via Amion and do not message via secure chat for urgent patient care matters. Secure chat can be used for non urgent patient care matters.  How to contact the [facility]  Attending or Consulting provider 7A - 7P or covering provider during after hours 7P -7A, for this patient?  Check the care team in [facility] and look for a) attending/consulting [facility] provider listed and b) the [facility] team listed. Page or secure chat 7A-7P. Log into [email or web address] and use [place]'s universal password to access. If you do not have the password, please contact the hospital operator. Locate the [facility] provider you are looking for under [facility] and page to a number that you can be directly reached. If you still have difficulty reaching the provider, please page the [facility] (Director on Call) for the Hospitalists listed on amion for assistance.

## 2021-11-05 NOTE — Consult Note (Signed)
[id number]            [place] [address]          [phone]       Chanel Drakeford [place] Medical Record [id number] Date of Birth: 06/01/1970  No ref. provider found Dr. Lucianne Lei dam Patient, No Pcp Per  Chief Complaint:   Left chest pain and tenderness Chief Complaint  Patient presents with   Abscess   Chest Pain  Patient examined, images of CT scan of chest and neck personally reviewed and counseled with patient.  History of Present Illness:     51 year old poorly controlled diabetic with a recent history of Klebsiella bacteremia from pneumonia complicated by right liver abscess which required IR drainage.  He has been taking oral antibiotics when he presented to the emergency department with tenderness and swelling over the left sternoclavicular joint.  CT scan demonstrates some bone destruction of the upper lateral aspect of the manubrium and the clavicular head.  There is no pneumonia.  The patient was admitted to the hospital and placed on IV antibiotics. He states the pain is not improved today.  He is afebrile white count is 6.9, glucose 210, hematocrit 42.  Exam demonstrates an area of soft tissue swelling with fluctuance over the left sternoclavicular joint.  The patient would benefit from incision and drainage and debridement of the infected joint.   Current Activity/ Functional Status: Zubrod Score: At the time of surgery this patient's most appropriate activity status/level should be described as: []     0    Normal activity, no symptoms []     1    Restricted in physical strenuous activity but ambulatory, able to do out light work [x]     2    Ambulatory and capable of self care, unable to do work activities, up and about >50 % of waking hours                              []     3    Only limited self care, in bed greater than 50% of waking hours []     4    Completely disabled, no self care, confined to bed or chair []     5     Moribund    Past Medical History:  Diagnosis Date   Diabetes mellitus without complication ([place])    Hypertension     Past Surgical History:  Procedure Laterality Date   APPENDECTOMY      Social History   Tobacco Use  Smoking Status Never  Smokeless Tobacco Not on file    Social History   Substance and Sexual Activity  Alcohol Use Not Currently   Comment: little    Social History   Socioeconomic History   Marital status: Married    Spouse name: Verdis Frederickson   Number of children: 3   Years of education: Not on file   Highest education level: Not on file  Occupational History   Not on file  Tobacco Use   Smoking status: Never   Smokeless tobacco: Not on file  Vaping Use   Vaping Use: Never used  Substance and Sexual Activity   Alcohol use: Not Currently    Comment: little   Drug use: No   Sexual activity: Not on file  Other Topics Concern  Not on file  Social History Narrative   Not on file   Social Determinants of Health   Financial Resource Strain: Not on file  Food Insecurity: Not on file  Transportation Needs: Not on file  Physical Activity: Not on file  Stress: Not on file  Social Connections: Not on file  Intimate Partner Violence: Not on file    No Known Allergies  Current Facility-Administered Medications  Medication Dose Route Frequency Provider Last Rate Last Admin   0.9 %  sodium chloride infusion   Intravenous Continuous Hugelmeyer, Alexis, DO 100 mL/hr at 11/05/21 1400 New Bag at 11/05/21 1400   acetaminophen (TYLENOL) tablet 650 mg  650 mg Oral Q6H PRN Hugelmeyer, Alexis, DO       Or   acetaminophen (TYLENOL) suppository 650 mg  650 mg Rectal Q6H PRN Hugelmeyer, Alexis, DO       albuterol (PROVENTIL) (2.5 MG/3ML) 0.083% nebulizer solution 2.5 mg  2.5 mg Nebulization Q6H PRN Hugelmeyer, Alexis, DO       amLODipine (NORVASC) tablet 5 mg  5 mg Oral Daily Hugelmeyer, Alexis, DO   5 mg at 11/05/21 1000   atorvastatin (LIPITOR) tablet 20 mg  20  mg Oral Daily Hugelmeyer, Alexis, DO   20 mg at 11/05/21 1000   bisacodyl (DULCOLAX) EC tablet 5 mg  5 mg Oral Daily PRN Hugelmeyer, Alexis, DO       ceFAZolin (ANCEF) IVPB 2g/100 mL premix  2 g Intravenous Q8H Kathryne Eriksson, NP 200 mL/hr at 11/05/21 1401 2 g at 11/05/21 1401   hydrALAZINE (APRESOLINE) injection 5 mg  5 mg Intravenous Q8H PRN Hugelmeyer, Alexis, DO       HYDROcodone-acetaminophen (NORCO/VICODIN) 5-325 MG per tablet 1-2 tablet  1-2 tablet Oral Q4H PRN Hugelmeyer, Alexis, DO   1 tablet at 11/05/21 0856   insulin aspart (novoLOG) injection 0-15 Units  0-15 Units Subcutaneous TID WC Hugelmeyer, Alexis, DO   5 Units at 11/05/21 1146   insulin aspart (novoLOG) injection 0-5 Units  0-5 Units Subcutaneous QHS Hugelmeyer, Alexis, DO       insulin glargine-yfgn (SEMGLEE) injection 40 Units  40 Units Subcutaneous Daily Hugelmeyer, Alexis, DO   40 Units at 11/05/21 1143   ipratropium (ATROVENT) nebulizer solution 0.5 mg  0.5 mg Nebulization Q6H PRN Hugelmeyer, Alexis, DO       methocarbamol (ROBAXIN) 500 mg in dextrose 5 % 50 mL IVPB  500 mg Intravenous Q6H PRN Hugelmeyer, Alexis, DO       metroNIDAZOLE (FLAGYL) tablet 500 mg  500 mg Oral Q12H Poindexter, Leann T, RPH   500 mg at 11/05/21 [id number]   morphine (PF) 2 MG/ML injection 1 mg  1 mg Intravenous Q6H PRN Hugelmeyer, Alexis, DO   1 mg at 11/05/21 0324   ondansetron (ZOFRAN) tablet 4 mg  4 mg Oral Q6H PRN Hugelmeyer, Alexis, DO       Or   ondansetron (ZOFRAN) injection 4 mg  4 mg Intravenous Q6H PRN Hugelmeyer, Alexis, DO       senna-docusate (Senokot-S) tablet 1 tablet  1 tablet Oral QHS PRN Hugelmeyer, Alexis, DO       traMADol (ULTRAM) tablet 50 mg  50 mg Oral Q6H PRN Hugelmeyer, Alexis, DO       zolpidem (AMBIEN) tablet 5 mg  5 mg Oral QHS PRN,MR X 1 Hugelmeyer, Alexis, DO   5 mg at 11/05/21 0324     Family History  Problem Relation Age of Onset   Healthy Mother  Hypertension Brother    Diabetes Brother    Heart disease Neg  Hx      Review of Systems:     Cardiac Review of Systems: Y or N  Chest Pain [   x ]  Resting SOB [   ] Exertional SOB  [  ]  Orthopnea [  ]   Pedal Edema [   ]    Palpitations [  ] Syncope  [  ]   Presyncope [   ]  General Review of Systems: [Y] = yes [  ]=no Constitional: recent weight change [x  ]; anorexia x[  ]; fatigue [  ]; nausea [  ]; night sweats [  ]; fever [  ]; or chills [  ];                                                                                                                                          Dental: poor dentition[  ]; Last Dentist visit: > 1 year  Eye : blurred vision [  ]; diplopia [   ]; vision changes [  ];  Amaurosis fugax[  ]; Resp: cough [  ];  wheezing[  ];  hemoptysis[  ]; shortness of breath[  ]; paroxysmal nocturnal dyspnea[  ]; dyspnea on exertion[  ]; or orthopnea[  ];  GI:  gallstones[  ], vomiting[  ];  dysphagia[  ]; melena[  ];  hematochezia [  ]; heartburn[  ];   Hx of  Colonoscopy[  ]; GU: kidney stones [  ]; hematuria[  ];   dysuria [  ];  nocturia[  ];  history of     obstruction [  ];                 Skin: rash, swelling[  x over upper sternum];, hair loss[  ];  peripheral edema[  ];  or itching[  ]; Musculosketetal: myalgias[  ];  joint swelling[  ];  joint erythema[  ];  joint pain[  ];  back pain[  ];  Heme/Lymph: bruising[  ];  bleeding[  ];  anemia[  ];  Neuro: TIA[  ];  headaches[  ];  stroke[  ];  vertigo[  ];  seizures[  ];   paresthesias[  ];  difficulty walking[  ];  Psych:depression[  ]; anxiety[  ];  Endocrine: diabetes[ x ];  thyroid dysfunction[  ];  Immunizations: Flu [  ]; Pneumococcal[  ];  Other:  Physical Exam: BP (!) 159/93   Pulse 78   Temp 98.8 F (37.1 C) (Oral)   Resp 18   Ht 5\' 3"  (1.6 m)   Wt 67.1 kg   SpO2 98%   BMI 26.22 kg/m       Physical Exam  General: Small middle-aged Hispanic male no acute distress HEENT: Normocephalic pupils equal , dentition adequate Neck: Supple without  JVD,  adenopathy, or bruit Chest: Clear to auscultation, symmetrical breath sounds, no rhonchi, notable tenderness with soft tissue swelling over the left sternoclavicular joint.  The central area is fluctuant.              Cardiovascular: Regular rate and rhythm, no murmur, no gallop, peripheral pulses             palpable in all extremities Abdomen:  Soft, nontender, no palpable mass or organomegaly Extremities: Warm, well-perfused, no clubbing cyanosis edema or tenderness,              no venous stasis changes of the legs Rectal/GU: Deferred Neuro: Grossly non--focal and symmetrical throughout Skin: Clean and dry without rash or ulceration    Diagnostic Studies & Laboratory data:     Recent Radiology Findings:   CT SOFT TISSUE NECK W CONTRAST  Result Date: 11/05/2021 CLINICAL DATA:  Bacteremia, neck pain with sternoclavicular osteomyelitis EXAM: CT NECK WITH CONTRAST TECHNIQUE: Multidetector CT imaging of the neck was performed using the standard protocol following the bolus administration of intravenous contrast. RADIATION DOSE REDUCTION: This exam was performed according to the departmental dose-optimization program which includes automated exposure control, adjustment of the mA and/or kV according to patient size and/or use of iterative reconstruction technique. CONTRAST:  26mL OMNIPAQUE IOHEXOL 300 MG/ML  SOLN COMPARISON:  CT chest 1 day prior FINDINGS: Pharynx and larynx: The nasal cavity and nasopharynx are unremarkable. The oral cavity and oropharynx are unremarkable. The parapharyngeal spaces are clear. The hypopharynx and larynx are unremarkable. The vocal folds are normal. There is no abnormal enhancement, fluid collection, or soft tissue lesion. Salivary glands: The parotid and submandibular glands are unremarkable. Thyroid: Unremarkable. Lymph nodes: There is no pathologic lymphadenopathy in the neck. Vascular: There is mild calcified plaque at the carotid bifurcations. The bilateral  internal jugular veins are patent. Limited intracranial: The imaged portions of the intracranial compartment are unremarkable. Visualized orbits: The globes and orbits are unremarkable. Mastoids and visualized paranasal sinuses: The paranasal sinuses and mastoid air cells are clear. Skeleton: The cervical spine is unremarkable. There is lytic change about the left sternoclavicular joint with surrounding inflammatory change and soft tissue thickening. There is a 1.2 cm by 1.0 cm peripherally enhancing fluid collection along the anterior aspect of the distal left clavicle suspicious for an abscess. There is an additional peripherally enhancing focus of hypodensity along the inner margin of the upper sternal manubrium in the upper mediastinum measuring 1.5 cm by 0.7 cm in the axial plane, also suspicious for abscess (2-135). Both of these abscesses appear contiguous with the joint space in the coronal plane (6-51, 6-42, 7-91). There is mild fat stranding in the left supraclavicular fossa. Upper chest: There is patchy and linear opacity in the lung apices, similar to the CT chest from 1 day prior. Other: None. IMPRESSION: 1. 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 There are small abscesses both along the anterosuperior margin of the distal clavicle and along the posterior surface of the upper sternal manubrium in the upper mediastinum common both of which appear contiguous with sternoclavicular joint space. 2. No acute pathology in the neck. Electronically Signed   By: Lesia Hausen M.D.   On: 11/05/2021 13:21   CT Chest W Contrast  Result Date: 11/04/2021 CLINICAL DATA:  Pain and swelling left chest wall. EXAM: CT CHEST WITH CONTRAST TECHNIQUE: Multidetector CT imaging of the chest was performed during intravenous contrast administration.  RADIATION DOSE REDUCTION: This exam was performed according to the  departmental dose-optimization program which includes automated exposure control, adjustment of the mA and/or kV according to patient size and/or use of iterative reconstruction technique. CONTRAST:  40mL OMNIPAQUE IOHEXOL 300 MG/ML  SOLN COMPARISON:  10/15/2021 FINDINGS: Cardiovascular: There is homogeneous enhancement in thoracic aorta. There are no intraluminal filling defects in the pulmonary artery branches. Mediastinum/Nodes: No significant lymphadenopathy seen in mediastinum. There is soft tissue swelling around the left sternoclavicular joint. There are lytic lesions in the medial end of left clavicle and sternum adjacent to the left sternoclavicular joint. There is no loculated fluid collection. There are no pockets of air in the soft tissues. Lungs/Pleura: There are small linear patchy densities in both parahilar regions and lower lung fields with interval improvement. There are no new focal infiltrates. There is no pleural effusion or pneumothorax. Upper Abdomen: Gallbladder stones are seen. There is no dilation of bile ducts. There is 2.8 cm cyst in the medial upper pole of left kidney. Musculoskeletal: Lytic lesions are seen in the medial end of left clavicle and sternum adjacent to the left sternoclavicular joint. There is soft tissue swelling around the left sternoclavicular joint without definite loculated fluid collection. IMPRESSION: There is new lytic lesion in the medial end of left clavicle and manubrium sternum adjacent to the left sternoclavicular joint. Findings suggest possible septic arthritis. There is soft tissue swelling around the left sternoclavicular joint without demonstrable drainable abscess. There is no evidence of pulmonary artery embolism. There is no evidence of thoracic aortic dissection. There is interval partial clearing of linear and ground-glass infiltrates in both lungs suggesting resolving pneumonia. No new infiltrates are seen. There is no pleural effusion. Gallbladder  stones.  Left renal cyst. Imaging finding of new septic arthritis in the left sternoclavicular joint was relayed to Dr. Regenia Skeeter by telephone call. Electronically Signed   By: Elmer Picker M.D.   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 Poorly controlled diabetes mellitus History of Klebsiella bacteremia and right liver abscess drained by IR.  Patient followed by ID  Patient would benefit from incision and drainage and debridement of the left sternoclavicular joint.  We will plan to do this at [facility] tomorrow afternoon and probably place a wound VAC.  I discussed the procedure including general anesthesia and the location of the incision and expected hospital recovery with the patient as well as the risks of bleeding and further infection.  He agrees to proceed.

## 2021-11-05 NOTE — Consult Note (Signed)
Date of Admission:  11/04/2021          Reason for Consult: Septic sternoclavicular joint  Referring Provider: Tonye Royalty, MD   Assessment:  Septic sternoclavicular joint likely from Klebsiella pneumonia bacteremia in the context of Liver abscess Neck pain Insulin-dependent diabetes mellitus Hypertension Hyperlipidemia  Plan:  Agree with continuing cefazolin with metronidazole He does need CT surgery consult and will need operative intervention I am ordering a CT of the neck given his complaints of pain there as well   Principal Problem:   Septic arthritis of sternoclavicular joint, left (HCC) Active Problems:   Uncontrolled type 2 diabetes mellitus with hyperglycemia, without long-term current use of insulin (HCC)   Bacteremia due to Klebsiella pneumoniae   Essential hypertension   Scheduled Meds:  amLODipine  5 mg Oral Daily   atorvastatin  20 mg Oral Daily   insulin aspart  0-15 Units Subcutaneous TID WC   insulin aspart  0-5 Units Subcutaneous QHS   insulin glargine-yfgn  40 Units Subcutaneous Daily   metroNIDAZOLE  500 mg Oral Q12H   Continuous Infusions:  sodium chloride 100 mL/hr at 11/05/21 0323    ceFAZolin (ANCEF) IV Stopped (11/05/21 4481)   methocarbamol (ROBAXIN) IV     PRN Meds:.acetaminophen **OR** acetaminophen, albuterol, bisacodyl, hydrALAZINE, HYDROcodone-acetaminophen, ipratropium, methocarbamol (ROBAXIN) IV, morphine injection, ondansetron **OR** ondansetron (ZOFRAN) IV, senna-docusate, traMADol, zolpidem  HPI: Benjamin Thompson is a 51 y.o. male with diabetes mellitus hypertension hyperlipidemia who was admitted to the hospital with Klebsiella pneumonia bacteremia with hepatic abscess that was drained.  Pneumonia was also isolated from hepatic abscess.  The patient was treated with cefazolin and metronidazole during hospitalization and then transferred vision and over to cefadroxil and metronidazole and follow-up with Dr. Thedore Mins as an  outpatient.  He was having pain in his sternoclavicular area but it seems as if this may have been communicated as a shoulder pain as he underwent work-up of his shoulder with an MRI only shown intra-articular biceps tendinosis.  He returned to the ER after being evaluated orthopedic surgery concern for septic servicer clavicular joint.  CT of the chest performed yesterday showed lytic lesion in the medial end of the left clavicle and manubrium and sternum adjacent to the left sternoclavicular joint concerning for septic arthritis with swelling around the sternoclavicular joint.  I ordered a CT of the neck today due to my concerns about his neck pain and this showed evidence again of sternoclavicular osteomyelitis septic arthritis now also with some small abscesses along the anterior superior margin of the distal clavicle and on the posterior surface of the upper sternal manubrium and upper mediastinum which seem contiguous with the sternoclavicular joint.  Patient been placed on appropriate biotics in the form of cefazolin and metronidazole.  There are plans for repeat CT of the abdomen pelvis tomorrow as well.  He clearly needs to be seen by CT surgery for surgical intervention.  He will need protracted antibiotics to treat his septic sternoclavicular joint and septic arthritis.  I spent 81 minutes with the patient including than 50% of the time in face to face counseling of the patient and he is septic sternoclavicular joint  personally reviewing along CT chest and CT neck along with updated culture data with review of medical records in preparation for the visit and during the visit and in coordination of his care.    Review of Systems: Review of Systems  Constitutional:  Positive for fever. Negative for chills, malaise/fatigue and  weight loss.  HENT:  Negative for congestion and sore throat.   Eyes:  Negative for blurred vision and photophobia.  Respiratory:  Negative for cough, shortness  of breath and wheezing.   Cardiovascular:  Negative for chest pain, palpitations and leg swelling.  Gastrointestinal:  Negative for abdominal pain, blood in stool, constipation, diarrhea, heartburn, melena, nausea and vomiting.  Genitourinary:  Negative for dysuria, flank pain and hematuria.  Musculoskeletal:  Positive for joint pain, myalgias and neck pain. Negative for back pain and falls.  Skin:  Negative for itching and rash.  Neurological:  Negative for dizziness, focal weakness, loss of consciousness, weakness and headaches.  Endo/Heme/Allergies:  Does not bruise/bleed easily.  Psychiatric/Behavioral:  Negative for depression and suicidal ideas. The patient does not have insomnia.     Past Medical History:  Diagnosis Date   Diabetes mellitus without complication (HCC)    Hypertension     Social History   Tobacco Use   Smoking status: Never  Vaping Use   Vaping Use: Never used  Substance Use Topics   Alcohol use: Not Currently    Comment: little   Drug use: No    Family History  Problem Relation Age of Onset   Healthy Mother    Hypertension Brother    Diabetes Brother    Heart disease Neg Hx    No Known Allergies  OBJECTIVE: Blood pressure (!) 159/93, pulse 78, temperature 98.8 F (37.1 C), temperature source Oral, resp. rate 18, height 5\' 3"  (1.6 m), weight 67.1 kg, SpO2 98 %.  Physical Exam Constitutional:      Appearance: He is well-developed.  HENT:     Head: Normocephalic and atraumatic.  Eyes:     Conjunctiva/sclera: Conjunctivae normal.  Cardiovascular:     Rate and Rhythm: Normal rate and regular rhythm.     Heart sounds: No murmur heard.    No friction rub. No gallop.  Pulmonary:     Effort: Pulmonary effort is normal. No respiratory distress.     Breath sounds: No stridor. No wheezing or rhonchi.  Chest:     Chest wall: Mass present.     Comments: : And tender about the [place] joint Abdominal:     General: There is no distension.     Palpations:  Abdomen is soft.  Musculoskeletal:        General: No tenderness. Normal range of motion.     Cervical back: Normal range of motion and neck supple.  Skin:    General: Skin is warm and dry.     Coloration: Skin is not pale.     Findings: No erythema or rash.  Neurological:     General: No focal deficit present.     Mental Status: He is alert and oriented to person, place, and time.  Psychiatric:        Mood and Affect: Mood normal.        Behavior: Behavior normal.        Thought Content: Thought content normal.        Judgment: Judgment normal.     Lab Results Lab Results  Component Value Date   WBC 6.9 11/05/2021   HGB 13.6 11/05/2021   HCT 42.0 11/05/2021   MCV 88.1 11/05/2021   PLT 412 (H) 11/05/2021    Lab Results  Component Value Date   CREATININE 0.71 11/05/2021   BUN 9 11/05/2021   NA 137 11/05/2021   K 3.8 11/05/2021   CL 102 11/05/2021  CO2 29 11/05/2021    Lab Results  Component Value Date   ALT 21 11/05/2021   AST 20 11/05/2021   ALKPHOS 59 11/05/2021   BILITOT 0.5 11/05/2021     Microbiology: Recent Results (from the past 240 hour(s))  Culture, blood (routine x 2)     Status: None (Preliminary result)   Collection Time: 11/04/21  7:00 PM   Specimen: BLOOD  Result Value Ref Range Status   Specimen Description   Final    BLOOD LEFT ANTECUBITAL Performed at [facility], [address] [place], [place] [address]    Special Requests   Final    BOTTLES DRAWN AEROBIC AND ANAEROBIC Blood Culture adequate volume Performed at [facility], [address] [place], [place] [address]    Culture   Final    NO GROWTH < 12 HOURS Performed at [facility], [address] [place], [place] [address]    Report Status PENDING  Incomplete  Culture, blood (routine x 2)     Status: None (Preliminary result)   Collection Time: 11/04/21  7:10 PM   Specimen: BLOOD  Result Value Ref Range Status   Specimen  Description   Final    BLOOD RIGHT ANTECUBITAL Performed at [facility], [address], [place], [place] [address]    Special Requests   Final    BOTTLES DRAWN AEROBIC AND ANAEROBIC Blood Culture adequate volume Performed at [facility], [address] [place], [place] [address]    Culture   Final    NO GROWTH < 12 HOURS Performed at [facility], [address], [place], [place] [address]    Report Status PENDING  Incomplete    Acey Lav, MD [facility] [phone] pager  11/05/2021, 1:31 PM

## 2021-11-06 ENCOUNTER — Ambulatory Visit (HOSPITAL_COMMUNITY)
Admission: RE | Admit: 2021-11-06 | Discharge: 2021-11-06 | Disposition: A | Discharge status: Home / Self Care | Payer: Self-pay | Source: Ambulatory Visit | Attending: Internal Medicine | Admitting: Internal Medicine

## 2021-11-06 ENCOUNTER — Encounter (HOSPITAL_COMMUNITY)
Admission: EM | Disposition: A | Discharge status: Home Health | Payer: Self-pay | Source: Home / Self Care | Attending: Internal Medicine

## 2021-11-06 ENCOUNTER — Inpatient Hospital Stay (HOSPITAL_COMMUNITY): Payer: Self-pay | Admitting: Anesthesiology

## 2021-11-06 ENCOUNTER — Other Ambulatory Visit: Payer: Self-pay

## 2021-11-06 ENCOUNTER — Encounter (HOSPITAL_COMMUNITY): Payer: Self-pay | Admitting: Family Medicine

## 2021-11-06 DIAGNOSIS — E1169 Type 2 diabetes mellitus with other specified complication: Secondary | ICD-10-CM

## 2021-11-06 DIAGNOSIS — M009 Pyogenic arthritis, unspecified: Secondary | ICD-10-CM

## 2021-11-06 DIAGNOSIS — M86119 Other acute osteomyelitis, unspecified shoulder: Secondary | ICD-10-CM

## 2021-11-06 DIAGNOSIS — K750 Abscess of liver: Secondary | ICD-10-CM

## 2021-11-06 DIAGNOSIS — B961 Klebsiella pneumoniae [K. pneumoniae] as the cause of diseases classified elsewhere: Secondary | ICD-10-CM

## 2021-11-06 HISTORY — PX: IRRIGATION AND DEBRIDEMENT ABSCESS: SHX5252

## 2021-11-06 HISTORY — PX: APPLICATION OF WOUND VAC: SHX5189

## 2021-11-06 LAB — CBC WITH DIFFERENTIAL/PLATELET
Abs Immature Granulocytes: 0.06 10*3/uL (ref 0.00–0.07)
Basophils Absolute: 0.1 10*3/uL (ref 0.0–0.1)
Basophils Relative: 1 %
Eosinophils Absolute: 0.2 10*3/uL (ref 0.0–0.5)
Eosinophils Relative: 3 %
HCT: 41.6 % (ref 39.0–52.0)
Hemoglobin: 14 g/dL (ref 13.0–17.0)
Immature Granulocytes: 1 %
Lymphocytes Relative: 23 %
Lymphs Abs: 1.6 10*3/uL (ref 0.7–4.0)
MCH: 29 pg (ref 26.0–34.0)
MCHC: 33.7 g/dL (ref 30.0–36.0)
MCV: 86.3 fL (ref 80.0–100.0)
Monocytes Absolute: 0.7 10*3/uL (ref 0.1–1.0)
Monocytes Relative: 10 %
Neutro Abs: 4.2 10*3/uL (ref 1.7–7.7)
Neutrophils Relative %: 62 %
Platelets: 394 10*3/uL (ref 150–400)
RBC: 4.82 MIL/uL (ref 4.22–5.81)
RDW: 13 % (ref 11.5–15.5)
WBC: 6.7 10*3/uL (ref 4.0–10.5)
nRBC: 0 % (ref 0.0–0.2)

## 2021-11-06 LAB — TYPE AND SCREEN
ABO/RH(D): A POS
Antibody Screen: NEGATIVE

## 2021-11-06 LAB — HEMOGLOBIN AND HEMATOCRIT, BLOOD
HCT: 45.4 % (ref 39.0–52.0)
Hemoglobin: 15.1 g/dL (ref 13.0–17.0)

## 2021-11-06 LAB — SURGICAL PCR SCREEN
MRSA, PCR: NEGATIVE
Staphylococcus aureus: NEGATIVE

## 2021-11-06 LAB — BASIC METABOLIC PANEL
Anion gap: 8 (ref 5–15)
BUN: 8 mg/dL (ref 6–20)
CO2: 27 mmol/L (ref 22–32)
Calcium: 8.5 mg/dL — ABNORMAL LOW (ref 8.9–10.3)
Chloride: 103 mmol/L (ref 98–111)
Creatinine, Ser: 0.65 mg/dL (ref 0.61–1.24)
GFR, Estimated: >60 mL/min (ref >60)
Glucose, Bld: 119 mg/dL — ABNORMAL HIGH (ref 70–99)
Potassium: 3.4 mmol/L — ABNORMAL LOW (ref 3.5–5.1)
Sodium: 138 mmol/L (ref 135–145)

## 2021-11-06 LAB — GLUCOSE, CAPILLARY
Glucose-Capillary: 105 mg/dL — ABNORMAL HIGH (ref 70–99)
Glucose-Capillary: 112 mg/dL — ABNORMAL HIGH (ref 70–99)
Glucose-Capillary: 94 mg/dL (ref 70–99)
Glucose-Capillary: 87 mg/dL (ref 70–99)
Glucose-Capillary: 183 mg/dL — ABNORMAL HIGH (ref 70–99)

## 2021-11-06 LAB — PLATELET COUNT: Platelets: 421 10*3/uL — ABNORMAL HIGH (ref 150–400)

## 2021-11-06 SURGERY — IRRIGATION AND DEBRIDEMENT ABSCESS
Anesthesia: General | Site: Chest | Laterality: Left

## 2021-11-06 MED ORDER — VANCOMYCIN HCL 1000 MG IV SOLR: INTRAVENOUS | Status: DC | PRN

## 2021-11-06 MED ORDER — SODIUM CHLORIDE 0.9 % IR SOLN
Status: DC | PRN
  Administered 2021-11-06: 2000 mL

## 2021-11-06 MED ORDER — LACTATED RINGERS IV SOLN: INTRAVENOUS | Status: DC

## 2021-11-06 MED ORDER — POTASSIUM CHLORIDE 10 MEQ/100ML IV SOLN
10.0000 meq | INTRAVENOUS | Status: AC
  Administered 2021-11-06 (×4): 10 meq via INTRAVENOUS
  Filled 2021-11-06 (×4): qty 100

## 2021-11-06 MED ORDER — SUGAMMADEX SODIUM 200 MG/2ML IV SOLN
INTRAVENOUS | Status: DC | PRN
  Administered 2021-11-06: 400 mg via INTRAVENOUS

## 2021-11-06 MED ORDER — PROPOFOL 10 MG/ML IV BOLUS
INTRAVENOUS | Status: AC
Start: 2021-11-06 — End: ?
  Filled 2021-11-06: qty 20

## 2021-11-06 MED ORDER — VANCOMYCIN HCL 1000 MG IV SOLR
INTRAVENOUS | Status: AC
  Filled 2021-11-06: qty 20

## 2021-11-06 MED ORDER — FENTANYL CITRATE (PF) 250 MCG/5ML IJ SOLN
INTRAMUSCULAR | Status: AC
  Filled 2021-11-06: qty 5

## 2021-11-06 MED ORDER — ORAL CARE MOUTH RINSE: 15.0000 mL | Freq: Once | OROMUCOSAL | Status: AC

## 2021-11-06 MED ORDER — INSULIN ASPART 100 UNIT/ML IJ SOLN: 0.0000 [IU] | INTRAMUSCULAR | Status: DC | PRN

## 2021-11-06 MED ORDER — ROCURONIUM BROMIDE 10 MG/ML (PF) SYRINGE
PREFILLED_SYRINGE | INTRAVENOUS | Status: DC | PRN
  Administered 2021-11-06: 50 mg via INTRAVENOUS

## 2021-11-06 MED ORDER — LIDOCAINE 2% (20 MG/ML) 5 ML SYRINGE
INTRAMUSCULAR | Status: DC | PRN
  Administered 2021-11-06: 30 mg via INTRAVENOUS

## 2021-11-06 MED ORDER — MORPHINE SULFATE (PF) 2 MG/ML IV SOLN
2.0000 mg | INTRAVENOUS | Status: DC | PRN
  Administered 2021-11-06 – 2021-11-15 (×4): 2 mg via INTRAVENOUS
  Filled 2021-11-06 (×4): qty 1

## 2021-11-06 MED ORDER — CHLORHEXIDINE GLUCONATE 0.12 % MT SOLN
OROMUCOSAL | Status: AC
  Administered 2021-11-06: 15 mL via OROMUCOSAL
  Filled 2021-11-06: qty 15

## 2021-11-06 MED ORDER — ACETAMINOPHEN 10 MG/ML IV SOLN: 1000.0000 mg | Freq: Once | INTRAVENOUS | Status: DC | PRN

## 2021-11-06 MED ORDER — FENTANYL CITRATE (PF) 100 MCG/2ML IJ SOLN
25.0000 ug | INTRAMUSCULAR | Status: DC | PRN
  Administered 2021-11-06 (×2): 50 ug via INTRAVENOUS

## 2021-11-06 MED ORDER — AMLODIPINE BESYLATE 10 MG PO TABS
10.0000 mg | ORAL_TABLET | Freq: Every day | ORAL | Status: DC
  Administered 2021-11-06 – 2021-11-09 (×4): 10 mg via ORAL
  Filled 2021-11-06 (×4): qty 1

## 2021-11-06 MED ORDER — CHLORHEXIDINE GLUCONATE 0.12 % MT SOLN: 15.0000 mL | Freq: Once | OROMUCOSAL | Status: AC

## 2021-11-06 MED ORDER — MIDAZOLAM HCL 2 MG/2ML IJ SOLN
INTRAMUSCULAR | Status: AC
  Filled 2021-11-06: qty 2

## 2021-11-06 MED ORDER — PROPOFOL 10 MG/ML IV BOLUS
INTRAVENOUS | Status: DC | PRN
  Administered 2021-11-06: 200 mg via INTRAVENOUS

## 2021-11-06 MED ORDER — FENTANYL CITRATE (PF) 100 MCG/2ML IJ SOLN
INTRAMUSCULAR | Status: AC
  Filled 2021-11-06: qty 2

## 2021-11-06 MED ORDER — MIDAZOLAM HCL 2 MG/2ML IJ SOLN
INTRAMUSCULAR | Status: DC | PRN
  Administered 2021-11-06: 2 mg via INTRAVENOUS

## 2021-11-06 MED ORDER — ONDANSETRON HCL 4 MG/2ML IJ SOLN
INTRAMUSCULAR | Status: DC | PRN
  Administered 2021-11-06: 4 mg via INTRAVENOUS

## 2021-11-06 MED ORDER — FENTANYL CITRATE (PF) 250 MCG/5ML IJ SOLN
INTRAMUSCULAR | Status: DC | PRN
Start: 2021-11-06 — End: 2021-11-06
  Administered 2021-11-06: 150 ug via INTRAVENOUS
  Administered 2021-11-06: 100 ug via INTRAVENOUS

## 2021-11-06 SURGICAL SUPPLY — 54 items
APL SKNCLS STERI-STRIP NONHPOA (GAUZE/BANDAGES/DRESSINGS)
BENZOIN TINCTURE PRP APPL 2/3 (GAUZE/BANDAGES/DRESSINGS) IMPLANT
BLADE CLIPPER SURG (BLADE) ×2 IMPLANT
BLADE SURG 10 STRL SS (BLADE) IMPLANT
BLADE SURG 15 STRL LF DISP TIS (BLADE) IMPLANT
BLADE SURG 15 STRL SS (BLADE)
BNDG GAUZE ELAST 4 BULKY (GAUZE/BANDAGES/DRESSINGS) IMPLANT
CANISTER SUCT 3000ML PPV (MISCELLANEOUS) ×2 IMPLANT
CANISTER WOUND CARE 500ML ATS (WOUND CARE) ×2 IMPLANT
CANISTER WOUNDNEG PRESSURE 500 (CANNISTER) ×1 IMPLANT
CLIP VESOCCLUDE SM WIDE 24/CT (CLIP) IMPLANT
CNTNR URN SCR LID CUP LEK RST (MISCELLANEOUS) IMPLANT
CONT SPEC 4OZ STRL OR WHT (MISCELLANEOUS)
CONTAINER PROTECT SURGISLUSH (MISCELLANEOUS) ×4 IMPLANT
DRAPE LAPAROSCOPIC ABDOMINAL (DRAPES) ×2 IMPLANT
DRAPE SLUSH/WARMER DISC (DRAPES) IMPLANT
DRSG PAD ABDOMINAL 8X10 ST (GAUZE/BANDAGES/DRESSINGS) IMPLANT
DRSG VAC ATS LRG SENSATRAC (GAUZE/BANDAGES/DRESSINGS) ×2 IMPLANT
DRSG VAC ATS MED SENSATRAC (GAUZE/BANDAGES/DRESSINGS) ×2 IMPLANT
DRSG VAC ATS SM SENSATRAC (GAUZE/BANDAGES/DRESSINGS) ×3 IMPLANT
ELECT REM PT RETURN 9FT ADLT (ELECTROSURGICAL) ×2
ELECTRODE REM PT RTRN 9FT ADLT (ELECTROSURGICAL) ×1 IMPLANT
GAUZE 4X4 16PLY ~~LOC~~+RFID DBL (SPONGE) ×2 IMPLANT
GAUZE SPONGE 4X4 12PLY STRL (GAUZE/BANDAGES/DRESSINGS) IMPLANT
GAUZE XEROFORM 5X9 LF (GAUZE/BANDAGES/DRESSINGS) IMPLANT
GLOVE BIO SURGEON STRL SZ7.5 (GLOVE) ×4 IMPLANT
GLOVE BIOGEL PI IND STRL 6.5 (GLOVE) IMPLANT
GLOVE BIOGEL PI INDICATOR 6.5 (GLOVE) ×1
GOWN STRL REUS W/ TWL LRG LVL3 (GOWN DISPOSABLE) ×2 IMPLANT
GOWN STRL REUS W/TWL LRG LVL3 (GOWN DISPOSABLE) ×4
HANDPIECE INTERPULSE COAX TIP (DISPOSABLE) ×4
HEMOSTAT POWDER SURGIFOAM 1G (HEMOSTASIS) IMPLANT
HEMOSTAT SURGICEL 2X14 (HEMOSTASIS) IMPLANT
KIT BASIN OR (CUSTOM PROCEDURE TRAY) ×2 IMPLANT
KIT SUCTION CATH 14FR (SUCTIONS) IMPLANT
KIT TURNOVER KIT B (KITS) ×2 IMPLANT
NS IRRIG 1000ML POUR BTL (IV SOLUTION) ×2 IMPLANT
PACK GENERAL/GYN (CUSTOM PROCEDURE TRAY) ×2 IMPLANT
PAD ARMBOARD 7.5X6 YLW CONV (MISCELLANEOUS) ×4 IMPLANT
SET HNDPC FAN SPRY TIP SCT (DISPOSABLE) ×1 IMPLANT
SPONGE T-LAP 18X18 ~~LOC~~+RFID (SPONGE) ×8 IMPLANT
SPONGE T-LAP 4X18 ~~LOC~~+RFID (SPONGE) ×2 IMPLANT
STAPLER VISISTAT 35W (STAPLE) IMPLANT
SUT ETHILON 3 0 FSL (SUTURE) IMPLANT
SUT VIC AB 1 CTX 36 (SUTURE)
SUT VIC AB 1 CTX36XBRD ANBCTR (SUTURE) IMPLANT
SUT VIC AB 2-0 CTX 27 (SUTURE) IMPLANT
SUT VIC AB 3-0 X1 27 (SUTURE) IMPLANT
SWAB COLLECTION DEVICE MRSA (MISCELLANEOUS) IMPLANT
SWAB CULTURE ESWAB REG 1ML (MISCELLANEOUS) IMPLANT
TOWEL GREEN STERILE (TOWEL DISPOSABLE) ×2 IMPLANT
TOWEL GREEN STERILE FF (TOWEL DISPOSABLE) ×2 IMPLANT
TRAY FOLEY MTR SLVR 16FR STAT (SET/KITS/TRAYS/PACK) IMPLANT
WATER STERILE IRR 1000ML POUR (IV SOLUTION) ×2 IMPLANT

## 2021-11-06 NOTE — Progress Notes (Signed)
Patient being transferred to [facility] via stretcher/ambulance.

## 2021-11-06 NOTE — Op Note (Signed)
Benjamin Thompson, Benjamin Thompson MEDICAL RECORD NO: [id number] ACCOUNT NO: [id number] DATE OF BIRTH: 12-27-1970 FACILITY: [facility] LOCATION: [id number] PHYSICIAN: Kerin Perna III, MD  Operative Report   DATE OF PROCEDURE: 11/06/2021  OPERATION:   1.  Excisional debridement of left sternoclavicular joint infection. 2.  Pulse lavage irrigation with vancomycin and saline solution. 3.  Placement of wound VAC suction system.  PREOPERATIVE DIAGNOSIS:  Acute presentation of left sternoclavicular joint infection documented by CT scan and associated with previous bacteremia, followed by infectious disease.  POSTOPERATIVE DIAGNOSIS:  Acute presentation of left sternoclavicular joint infection documented by CT scan and associated with previous bacteremia, followed by infectious disease.  ANESTHESIA:  General.  SURGEON:  Kerin Perna III, MD  DESCRIPTION OF PROCEDURE:  The patient was brought directly from preoperative holding where informed consent was documented and the patient was marked and final issues addressed with the patient regarding the benefits and risks of the procedure.  The  patient was placed supine on the operating table and general anesthesia was induced.  The chest was prepped and draped as a sterile field.  A proper timeout was performed.  An incision was made centered on the fluctuant center of the soft tissue deformity at the left sternoclavicular joint area.  The incision was carried down and a space was encountered with some purulent material, which was sent for cultures.  The  indurated unhealthy soft tissue in the area over the sternoclavicular joint was excised.  There was no deep tract underneath the sternum.  The clavicular head was partially destroyed and curettage was used to clean off the necrotic area of bone down to  healthy bone.  Some lateral manubrial cortex was also removed with a curette.  A liter of vancomycin saline irrigation was then directed to the wound through the  pulse lavage.  Hemostasis was achieved.  A small wound VAC sponge was cut to the appropriate  orientation and size and placed in the wound and covered with the sterile sheets and the suction line and connected to the wound VAC pump.  The patient was then reversed from anesthesia and returned to recovery room in stable condition.   VAI D: 11/06/2021 6:18:31 pm T: 11/06/2021 11:37:00 pm  JOB: [id number]/ [id number]

## 2021-11-07 ENCOUNTER — Encounter (HOSPITAL_COMMUNITY): Payer: Self-pay | Admitting: Cardiothoracic Surgery

## 2021-11-07 LAB — CBC WITH DIFFERENTIAL/PLATELET
Abs Immature Granulocytes: 0.05 10*3/uL (ref 0.00–0.07)
Basophils Absolute: 0.1 10*3/uL (ref 0.0–0.1)
Basophils Relative: 1 %
Eosinophils Absolute: 0.1 10*3/uL (ref 0.0–0.5)
Eosinophils Relative: 2 %
HCT: 43.1 % (ref 39.0–52.0)
Hemoglobin: 14.2 g/dL (ref 13.0–17.0)
Immature Granulocytes: 1 %
Lymphocytes Relative: 22 %
Lymphs Abs: 1.7 10*3/uL (ref 0.7–4.0)
MCH: 28.4 pg (ref 26.0–34.0)
MCHC: 32.9 g/dL (ref 30.0–36.0)
MCV: 86.2 fL (ref 80.0–100.0)
Monocytes Absolute: 0.8 10*3/uL (ref 0.1–1.0)
Monocytes Relative: 10 %
Neutro Abs: 4.9 10*3/uL (ref 1.7–7.7)
Neutrophils Relative %: 64 %
Platelets: 388 10*3/uL (ref 150–400)
RBC: 5 MIL/uL (ref 4.22–5.81)
RDW: 13.1 % (ref 11.5–15.5)
WBC: 7.6 10*3/uL (ref 4.0–10.5)
nRBC: 0 % (ref 0.0–0.2)

## 2021-11-07 LAB — GLUCOSE, CAPILLARY
Glucose-Capillary: 82 mg/dL (ref 70–99)
Glucose-Capillary: 174 mg/dL — ABNORMAL HIGH (ref 70–99)
Glucose-Capillary: 140 mg/dL — ABNORMAL HIGH (ref 70–99)
Glucose-Capillary: 185 mg/dL — ABNORMAL HIGH (ref 70–99)

## 2021-11-07 LAB — BASIC METABOLIC PANEL
Anion gap: 10 (ref 5–15)
BUN: 6 mg/dL (ref 6–20)
CO2: 25 mmol/L (ref 22–32)
Calcium: 8.9 mg/dL (ref 8.9–10.3)
Chloride: 104 mmol/L (ref 98–111)
Creatinine, Ser: 0.68 mg/dL (ref 0.61–1.24)
GFR, Estimated: >60 mL/min (ref >60)
Glucose, Bld: 115 mg/dL — ABNORMAL HIGH (ref 70–99)
Potassium: 3.7 mmol/L (ref 3.5–5.1)
Sodium: 139 mmol/L (ref 135–145)

## 2021-11-08 LAB — CBC WITH DIFFERENTIAL/PLATELET
Abs Immature Granulocytes: 0.06 10*3/uL (ref 0.00–0.07)
Basophils Absolute: 0.1 10*3/uL (ref 0.0–0.1)
Basophils Relative: 1 %
Eosinophils Absolute: 0.3 10*3/uL (ref 0.0–0.5)
Eosinophils Relative: 5 %
HCT: 39.3 % (ref 39.0–52.0)
Hemoglobin: 13.1 g/dL (ref 13.0–17.0)
Immature Granulocytes: 1 %
Lymphocytes Relative: 30 %
Lymphs Abs: 2.1 10*3/uL (ref 0.7–4.0)
MCH: 28.9 pg (ref 26.0–34.0)
MCHC: 33.3 g/dL (ref 30.0–36.0)
MCV: 86.8 fL (ref 80.0–100.0)
Monocytes Absolute: 0.8 10*3/uL (ref 0.1–1.0)
Monocytes Relative: 12 %
Neutro Abs: 3.7 10*3/uL (ref 1.7–7.7)
Neutrophils Relative %: 51 %
Platelets: 326 10*3/uL (ref 150–400)
RBC: 4.53 MIL/uL (ref 4.22–5.81)
RDW: 13.2 % (ref 11.5–15.5)
WBC: 7.1 10*3/uL (ref 4.0–10.5)
nRBC: 0 % (ref 0.0–0.2)

## 2021-11-08 LAB — GLUCOSE, CAPILLARY
Glucose-Capillary: 78 mg/dL (ref 70–99)
Glucose-Capillary: 160 mg/dL — ABNORMAL HIGH (ref 70–99)
Glucose-Capillary: 245 mg/dL — ABNORMAL HIGH (ref 70–99)
Glucose-Capillary: 256 mg/dL — ABNORMAL HIGH (ref 70–99)
Glucose-Capillary: 213 mg/dL — ABNORMAL HIGH (ref 70–99)

## 2021-11-09 DIAGNOSIS — M00819 Arthritis due to other bacteria, unspecified shoulder: Secondary | ICD-10-CM

## 2021-11-09 LAB — CULTURE, BLOOD (ROUTINE X 2)
Culture: NO GROWTH
Culture: NO GROWTH
Special Requests: ADEQUATE
Special Requests: ADEQUATE

## 2021-11-09 LAB — GLUCOSE, CAPILLARY
Glucose-Capillary: 111 mg/dL — ABNORMAL HIGH (ref 70–99)
Glucose-Capillary: 178 mg/dL — ABNORMAL HIGH (ref 70–99)
Glucose-Capillary: 202 mg/dL — ABNORMAL HIGH (ref 70–99)
Glucose-Capillary: 154 mg/dL — ABNORMAL HIGH (ref 70–99)

## 2021-11-09 MED ORDER — GLIPIZIDE 10 MG PO TABS: 5.0000 mg | ORAL_TABLET | Freq: Every day | ORAL | Status: DC

## 2021-11-09 MED ORDER — INSULIN GLARGINE-YFGN 100 UNIT/ML ~~LOC~~ SOLN
40.0000 [IU] | Freq: Every day | SUBCUTANEOUS | Status: DC
  Administered 2021-11-10 – 2021-11-16 (×7): 40 [IU] via SUBCUTANEOUS
  Filled 2021-11-09 (×7): qty 0.4

## 2021-11-09 MED ORDER — INSULIN GLARGINE-YFGN 100 UNIT/ML ~~LOC~~ SOLN: 20.0000 [IU] | Freq: Once | SUBCUTANEOUS | Status: DC

## 2021-11-09 MED ORDER — LISINOPRIL 5 MG PO TABS
5.0000 mg | ORAL_TABLET | Freq: Every day | ORAL | Status: DC
  Administered 2021-11-10 – 2021-11-16 (×7): 5 mg via ORAL
  Filled 2021-11-09 (×7): qty 1

## 2021-11-09 MED ORDER — METFORMIN HCL 500 MG PO TABS
500.0000 mg | ORAL_TABLET | Freq: Two times a day (BID) | ORAL | Status: DC
  Administered 2021-11-09 – 2021-11-16 (×15): 500 mg via ORAL
  Filled 2021-11-09 (×15): qty 1

## 2021-11-09 MED ORDER — LINAGLIPTIN 5 MG PO TABS
5.0000 mg | ORAL_TABLET | Freq: Every day | ORAL | Status: DC
  Administered 2021-11-09: 5 mg via ORAL
  Filled 2021-11-09: qty 1

## 2021-11-09 MED ORDER — INSULIN GLARGINE-YFGN 100 UNIT/ML ~~LOC~~ SOLN
20.0000 [IU] | Freq: Every day | SUBCUTANEOUS | Status: DC
  Administered 2021-11-09: 20 [IU] via SUBCUTANEOUS
  Filled 2021-11-09: qty 0.2

## 2021-11-09 MED ORDER — DEXTROSE 5 % IV SOLN: 500.0000 mg | Freq: Four times a day (QID) | INTRAVENOUS | Status: DC | PRN

## 2021-11-09 NOTE — Progress Notes (Signed)
[facility]   Reason for visit: Follow up on liver abscess and sternoclavicular septic arthritis with osteomyelitis  Interval History: s/p surgical debridement on 6/23; Wound VAC in place.  Remains afebrile.  Day 6 total antibiotics  Physical Exam: Constitutional:  Vitals:   11/08/21 2140 11/09/21 0549  BP: 137/89 (!) 143/88  Pulse: 86 90  Resp: 16 16  Temp: 98.3 F (36.8 C) 98.4 F (36.9 C)  SpO2: 100% 94%   patient appears in NAD Chest: VAC in place Respiratory: Normal respiratory effort Cardiovascular: RRR   Review of Systems: Constitutional: negative for fevers and chills  Lab Results  Component Value Date   WBC 7.1 11/08/2021   HGB 13.1 11/08/2021   HCT 39.3 11/08/2021   MCV 86.8 11/08/2021   PLT 326 11/08/2021    Lab Results  Component Value Date   CREATININE 0.68 11/07/2021   BUN 6 11/07/2021   NA 139 11/07/2021   K 3.7 11/07/2021   CL 104 11/07/2021   CO2 25 11/07/2021    Lab Results  Component Value Date   ALT 21 11/05/2021   AST 20 11/05/2021   ALKPHOS 59 11/05/2021     Microbiology: Recent Results (from the past 240 hour(s))  Culture, blood (routine x 2)     Status: None   Collection Time: 11/04/21  7:00 PM   Specimen: BLOOD  Result Value Ref Range Status   Specimen Description   Final    BLOOD LEFT ANTECUBITAL Performed at [facility], [address], [place], [place] [address]    Special Requests   Final    BOTTLES DRAWN AEROBIC AND ANAEROBIC Blood Culture adequate volume Performed at [facility], [address] [place], [place] [address]    Culture   Final    NO GROWTH 5 DAYS Performed at [facility], [address], [place], [place] [address]    Report Status 11/09/2021 FINAL  Final  Culture, blood (routine x 2)     Status: None   Collection Time: 11/04/21  7:10 PM   Specimen: BLOOD  Result Value Ref Range Status   Specimen Description   Final     BLOOD RIGHT ANTECUBITAL Performed at [facility], [address], [place], [place] [address]    Special Requests   Final    BOTTLES DRAWN AEROBIC AND ANAEROBIC Blood Culture adequate volume Performed at [facility], [address], [place], [place] [address]    Culture   Final    NO GROWTH 5 DAYS Performed at [facility], [address] [place], [place] [address]    Report Status 11/09/2021 FINAL  Final  Surgical pcr screen     Status: None   Collection Time: 11/05/21  3:44 PM   Specimen: Nasal Mucosa; Nasal Swab  Result Value Ref Range Status   MRSA, PCR NEGATIVE NEGATIVE Final   Staphylococcus aureus NEGATIVE NEGATIVE Final    Comment: (NOTE) The Xpert SA Assay (FDA approved for NASAL specimens in patients 53 years of age and older), is one component of a comprehensive surveillance program. It is not intended to diagnose infection nor to guide or monitor treatment. Performed at [facility], [address], [place], [place] [address]   Surgical pcr screen     Status: None   Collection Time: 11/06/21  5:22 AM   Specimen: Nasal Mucosa; Nasal Swab  Result Value Ref Range Status  MRSA, PCR NEGATIVE NEGATIVE Final   Staphylococcus aureus NEGATIVE NEGATIVE Final    Comment: (NOTE) The Xpert SA Assay (FDA approved for NASAL specimens in patients 68 years of age and older), is one component of a comprehensive surveillance program. It is not intended to diagnose infection nor to guide or monitor treatment. Performed at [facility], [address], [place], [place] [address]   Aerobic/Anaerobic Culture w Gram Stain (surgical/deep wound)     Status: None (Preliminary result)   Collection Time: 11/06/21  4:05 PM   Specimen: Chest; Abscess  Result Value Ref Range Status   Specimen Description ABSCESS  Final   Special Requests CHEST WALL ABSCESS  Final   Gram Stain   Final    ABUNDANT WBC PRESENT,BOTH PMN  AND MONONUCLEAR NO ORGANISMS SEEN Performed at [facility], [address], [place], [place] [address]    Culture   Final    RARE KLEBSIELLA PNEUMONIAE NO ANAEROBES ISOLATED; CULTURE IN PROGRESS FOR 5 DAYS    Report Status PENDING  Incomplete   Organism ID, Bacteria KLEBSIELLA PNEUMONIAE  Final      Susceptibility   Klebsiella pneumoniae - MIC*    AMPICILLIN >=32 RESISTANT Resistant     CEFAZOLIN <=4 SENSITIVE Sensitive     CEFEPIME <=0.12 SENSITIVE Sensitive     CEFTAZIDIME <=1 SENSITIVE Sensitive     CEFTRIAXONE <=0.25 SENSITIVE Sensitive     CIPROFLOXACIN <=0.25 SENSITIVE Sensitive     GENTAMICIN <=1 SENSITIVE Sensitive     IMIPENEM <=0.25 SENSITIVE Sensitive     TRIMETH/SULFA <=20 SENSITIVE Sensitive     AMPICILLIN/SULBACTAM 4 SENSITIVE Sensitive     PIP/TAZO <=4 SENSITIVE Sensitive     * RARE KLEBSIELLA PNEUMONIAE  Aerobic Culture w Gram Stain (superficial specimen)     Status: None (Preliminary result)   Collection Time: 11/06/21  4:05 PM   Specimen: Abscess  Result Value Ref Range Status   Specimen Description ABSCESS  Final   Special Requests LEFT STERNOCLAVICULAR JOINT  Final   Gram Stain NO WBC SEEN NO ORGANISMS SEEN   Final   Culture   Final    RARE KLEBSIELLA PNEUMONIAE SUSCEPTIBILITIES PERFORMED ON PREVIOUS CULTURE WITHIN THE LAST 5 DAYS. Performed at [facility], [address] [place], [place] [address]    Report Status PENDING  Incomplete    Impression/Plan:  1. Sternoclavicular septic arthritis with osteomyelitis - culture growth also with Klebsiella pneumoniae as expected with the same sensitivity pattern.  I will have him continue with cefazolin.  Will stop metronidazole at this point. When ready for discharge, will use oral cipro high dose 750 mg twice a day for a prolonged period, about 4 weeks at discharge.    2.  Liver abscess - as above and on treatment with cefazolin.    3.  DM - emphasized the need for good blood sugar control.

## 2021-11-09 NOTE — Plan of Care (Signed)
  Problem: Education: Goal: Knowledge of General Education information will improve Description Including pain rating scale, medication(s)/side effects and non-pharmacologic comfort measures Outcome: Progressing   Problem: Nutrition: Goal: Adequate nutrition will be maintained Outcome: Progressing   Problem: Pain Managment: Goal: General experience of comfort will improve Outcome: Progressing

## 2021-11-09 NOTE — Inpatient Diabetes Management (Addendum)
Inpatient Diabetes Program Recommendations  AACE/ADA: New Consensus Statement on Inpatient Glycemic Control   Target Ranges:  Prepandial:   less than 140 mg/dL      Peak postprandial:   less than 180 mg/dL (1-2 hours)      Critically ill patients:  140 - 180 mg/dL    Latest Reference Range & Units 11/08/21 07:37 11/08/21 12:20 11/08/21 17:05 11/08/21 17:06 11/08/21 21:34 11/09/21 07:45  Glucose-Capillary 70 - 99 mg/dL 78 324 (H) 401 (H) 027 (H) 213 (H) 111 (H)    Latest Reference Range & Units 10/15/21 05:10  Hemoglobin A1C 4.8 - 5.6 % 13.2 (H)   Review of Glycemic Control  Diabetes history: DM2 Outpatient Diabetes medications: Semglee 40 units daily, Metformin 500 mg BID Current orders for Inpatient glycemic control: Semglee 20 units daily, Novolog 0-15 units TID with meals, Novolog 0-5 units QHS, Metformin i500 mg BID, Tradjenta 5 mg daily, Glipizide 5 mg QAM  Inpatient Diabetes Program Recommendations:    Insulin: Noted Tradjenta and Glipizide added and Semglee decreased from 40 units daily to 20 units daily. Glucose trended fairly well on Semglee 40 units daily and minimal Novolog correction. Given A1C of 13.2%, septic arthritis, and bacteremia, would recommend using insulin versus adding oral DM medications. Therefore, may want to consider discontinuing Tradjenta and Glipizide and increasing Semglee back up to 40 units daily.  HbgA1C: A1C 13.2% on 10/15/21 indicating an average glucose of  332 mg/dl over the past 2-3 months. Inpatient diabetes coordinator spoke with patient at length on 10/16/21 during prior admission. Patient was discharged new to insulin and was prescribed Semglee 40 units daily and Metformin 500 mg BID.   NOTE: Patient recently inpatient 10/15/21-10/20/21 and was seen by inpatient diabetes coordinator on 10/16/21 (with onsite interpreter Barbarann Ehlers). Patient was educated on DM, importance of DM control, and educated on on insulin administration. Patient preferred insulin pens  for insulin administration and was discharged on Semglee 40 units daily and Metformin 500 mg BID. Consulted TOC to assist with arranging follow up with local clinic for uninsured (if not already done) and for medication assistance if needed. Tammy, RN inquired about insulin outpatient and patient reports that he was taking the insulin at home.  Addendum 11/09/21@13 :15-Spoke with patient at bedside regarding DM and insulin. Patient confirms that he was able to get [facility] when he got discharged on 10/20/21 and that he has an appointment on [facility] on 11/16/21 to establish care. Patient states he does not know how long he will have to stay inpatient and he may miss that appointment. Encouraged patient to call [facility] to change appointment if he is not going to be discharged prior to appointment. Patient states that glucose was doing fairly well at home with the Semglee 40 units. Patient has no questions at this time related to DM.   Thanks, Orlando Penner, RN, MSN, CDCES Diabetes Coordinator Inpatient Diabetes Program [phone] (Team Pager from 8am to 5pm)

## 2021-11-10 ENCOUNTER — Other Ambulatory Visit (HOSPITAL_COMMUNITY): Payer: Self-pay

## 2021-11-10 LAB — CBC WITH DIFFERENTIAL/PLATELET
Abs Immature Granulocytes: 0.05 10*3/uL (ref 0.00–0.07)
Basophils Absolute: 0.1 10*3/uL (ref 0.0–0.1)
Basophils Relative: 1 %
Eosinophils Absolute: 0.3 10*3/uL (ref 0.0–0.5)
Eosinophils Relative: 3 %
HCT: 40.7 % (ref 39.0–52.0)
Hemoglobin: 13.1 g/dL (ref 13.0–17.0)
Immature Granulocytes: 1 %
Lymphocytes Relative: 25 %
Lymphs Abs: 1.8 10*3/uL (ref 0.7–4.0)
MCH: 28.1 pg (ref 26.0–34.0)
MCHC: 32.2 g/dL (ref 30.0–36.0)
MCV: 87.3 fL (ref 80.0–100.0)
Monocytes Absolute: 0.6 10*3/uL (ref 0.1–1.0)
Monocytes Relative: 8 %
Neutro Abs: 4.5 10*3/uL (ref 1.7–7.7)
Neutrophils Relative %: 62 %
Platelets: 300 10*3/uL (ref 150–400)
RBC: 4.66 MIL/uL (ref 4.22–5.81)
RDW: 13.1 % (ref 11.5–15.5)
WBC: 7.4 10*3/uL (ref 4.0–10.5)
nRBC: 0 % (ref 0.0–0.2)

## 2021-11-10 LAB — BASIC METABOLIC PANEL
Anion gap: 7 (ref 5–15)
BUN: 10 mg/dL (ref 6–20)
CO2: 25 mmol/L (ref 22–32)
Calcium: 8.5 mg/dL — ABNORMAL LOW (ref 8.9–10.3)
Chloride: 105 mmol/L (ref 98–111)
Creatinine, Ser: 0.65 mg/dL (ref 0.61–1.24)
GFR, Estimated: >60 mL/min (ref >60)
Glucose, Bld: 107 mg/dL — ABNORMAL HIGH (ref 70–99)
Potassium: 3.7 mmol/L (ref 3.5–5.1)
Sodium: 137 mmol/L (ref 135–145)

## 2021-11-10 LAB — GLUCOSE, CAPILLARY
Glucose-Capillary: 117 mg/dL — ABNORMAL HIGH (ref 70–99)
Glucose-Capillary: 120 mg/dL — ABNORMAL HIGH (ref 70–99)
Glucose-Capillary: 167 mg/dL — ABNORMAL HIGH (ref 70–99)
Glucose-Capillary: 213 mg/dL — ABNORMAL HIGH (ref 70–99)

## 2021-11-10 MED ORDER — SULFAMETHOXAZOLE-TRIMETHOPRIM 800-160 MG PO TABS
2.0000 | ORAL_TABLET | Freq: Two times a day (BID) | ORAL | 0 refills | Status: DC
  Filled 2021-11-10: qty 120, 30d supply, fill #0

## 2021-11-11 LAB — GLUCOSE, CAPILLARY
Glucose-Capillary: 95 mg/dL (ref 70–99)
Glucose-Capillary: 100 mg/dL — ABNORMAL HIGH (ref 70–99)
Glucose-Capillary: 98 mg/dL (ref 70–99)
Glucose-Capillary: 129 mg/dL — ABNORMAL HIGH (ref 70–99)

## 2021-11-11 LAB — AEROBIC CULTURE W GRAM STAIN (SUPERFICIAL SPECIMEN): Gram Stain: NONE SEEN

## 2021-11-11 LAB — AEROBIC/ANAEROBIC CULTURE W GRAM STAIN (SURGICAL/DEEP WOUND)

## 2021-11-11 NOTE — Progress Notes (Signed)
Communicated to [facility] Maple Hudson FNP that wound vac is no longer maintaining the seal despite redressing around it. Allyne Gee FNP indicated to do Wet to Dry dressing instead and tomorrow morning WOC will reapply wound vac.

## 2021-11-11 NOTE — TOC Progression Note (Signed)
Transition of Care [facility]) - Progression Note    Patient Details  Name: Darrel Gloss MRN: [id number] Date of Birth: [birth year]  Transition of Care [facility]) CM/SW Contact  Tom-Johnson, Hershal Coria, RN Phone Number: 11/11/2021, 3:34 PM  Clinical Narrative:     Patient had surgical debridement of infected left Sternoclavicular Joint with Wound VAC placement by cardiovascular surgery on 06/23. Patient will need portable wound care versus removal at the time of discharge. Currently on IV abx, could change to Bactrim PO at discharge per MD.  CM spoke with [facility] [phone]) for wound vac at discharge. and she states she will email a form to fill out and also a charity application form for patient to fill out as he has no insurance. Forms will be filled out once received.  CM will continue to follow with needs.       Expected Discharge Plan and Services                                                 Social Determinants of Health (SDOH) Interventions    Readmission Risk Interventions    10/20/2021   12:02 PM  Readmission Risk Prevention Plan  Post Dischage Appt Not Complete  Appt Comments f/u with [facility] 7/3  Medication Screening Complete  Transportation Screening Complete

## 2021-11-11 NOTE — Progress Notes (Signed)
[facility]                                                                              Benjamin Thompson, is a 51 y.o. male, DOB - 02/12/1971, DEY:[id number] Admit date - 11/04/2021    Outpatient Primary MD for the patient is Patient, No Pcp Per  LOS - 7  days  Chief Complaint  Patient presents with   Abscess   Chest Pain       Brief summary   Benjamin Thompson is a 52 y.o. male with PMH significant for DM2, HTN. Patient was recently hospitalized 5/31-6/6 for liver abscess that was drained, found to have Klebsiella pneumonia bacteremia and was discharged on oral cefadroxil and Flagyl for 4 weeks to complete on 7/2. Patient presented to the ED on 6/21 with 3 weeks of left shoulder pain, swelling. Findings suggestive of of septic arthritis of left sternoclavicular joint.  ID, cardiovascular surgery consulted on admission.   Started on antibiotics.   6/23, patient underwent surgical debridement of infected left sternoclavicular joint and wound VAC placement by cardiovascular surgery.    Assessment & Plan    Principal Problem:   Septic arthritis of sternoclavicular joint, left (HCC) -Status post I&D and drainage with wound VAC placement by CT VS -Wound cultures grew Klebsiella pneumonia -ID recommended IV Ancef while inpatient.  Can use oral Bactrim DS 2 DS tabs twice daily for 1 month at DC. -Wound VAC changed to Monday Wednesday Friday, will follow disposition encouragements, CT VS/wound care team  Active Problems:   Bacteremia due to Klebsiella pneumoniae -ID recommendations as above, continue IV Ancef    Uncontrolled type 2 diabetes mellitus with hyperglycemia, without long-term current use of insulin (HCC) -Hemoglobin A1c 13.1 -During previous admission, patient was discharged on Semglee 40 units daily and metformin -Continue current regimen Recent Labs    11/10/21 0721 11/10/21 1128 11/10/21 1521 11/10/21 2035 11/11/21 0736 11/11/21 1122  GLUCAP  117* 120* 167* 213* 95 100*       Essential hypertension -Continue lisinopril 5 mg daily  Hyperlipidemia Continue Lipitor  Estimated body mass index is 26.22 kg/m as calculated from the following:   Height as of this encounter: 5\' 3"  (1.6 m).   Weight as of this encounter: 67.1 kg.  Code Status: Full code DVT Prophylaxis:  SCDs Start: 11/05/21 0313   Level of Care: Level of care: Med-Surg Family Communication: Updated patient's wife at the bedside  Disposition Plan:      Remains inpatient appropriate: Once cleared by CT VS and wound care.  Will need portable wound care versus removal at the time of discharge.   Procedures:  -Status post I&D and drainage with wound VAC placement by CT VS  Consultants:   Infectious disease CT VS  Antimicrobials:   Anti-infectives (From admission, onward)    Start     Dose/Rate Route Frequency Ordered Stop   11/10/21 0000  sulfamethoxazole-trimethoprim (BACTRIM DS) 800-160 MG tablet        2 tablet Oral 2 times daily 11/10/21 1359 12/10/21 2359   11/06/21 1710  vancomycin (VANCOCIN) 1,000 mg in sodium chloride 0.9 % 1,000  mL irrigation  Status:  Discontinued          As needed 11/06/21 1712 11/06/21 1737   11/06/21 1200  cefOXitin (MEFOXIN) 2 g in sodium chloride 0.9 % 100 mL IVPB        2 g 200 mL/hr over 30 Minutes Intravenous 60 min pre-op 11/05/21 1622 11/06/21 1821   11/05/21 0900  metroNIDAZOLE (FLAGYL) tablet 500 mg  Status:  Discontinued        500 mg Oral Every 12 hours 11/05/21 0323 11/10/21 0929   11/05/21 0600  metroNIDAZOLE (FLAGYL) tablet 500 mg  Status:  Discontinued        500 mg Oral Every 8 hours 11/05/21 0312 11/05/21 0323   11/05/21 0500  ceFAZolin (ANCEF) IVPB 2g/100 mL premix        2 g 200 mL/hr over 30 Minutes Intravenous Every 8 hours 11/05/21 0326     11/05/21 0315  ceFAZolin (ANCEF) IVPB 2g/100 mL premix  Status:  Discontinued        2 g 200 mL/hr over 30 Minutes Intravenous  Once 11/05/21 0312 11/05/21 0326    11/04/21 2000  ceFAZolin (ANCEF) IVPB 2g/100 mL premix        2 g 200 mL/hr over 30 Minutes Intravenous  Once 11/04/21 1949 11/04/21 2153   11/04/21 1945  metroNIDAZOLE (FLAGYL) tablet 500 mg  Status:  Discontinued        500 mg Oral Every 12 hours 11/04/21 1930 11/05/21 0312          Medications  atorvastatin  20 mg Oral Daily   insulin aspart  0-15 Units Subcutaneous TID WC   insulin aspart  0-5 Units Subcutaneous QHS   insulin glargine-yfgn  40 Units Subcutaneous Daily   lisinopril  5 mg Oral Daily   metFORMIN  500 mg Oral BID WC      Subjective:   Benjamin Thompson was seen and examined today.  Complaining of pain in the left shoulder upper chest area due to wound VAC and abscess.   Patient denies dizziness, shortness of breath, abdominal pain, N/V/D/C, new weakness, numbess, tingling. No acute events overnight.    Objective:   Vitals:   11/10/21 1312 11/10/21 2039 11/11/21 0437 11/11/21 0813  BP: (!) 148/96 132/87 (!) 135/93   Pulse: 100 97 82   Resp: 19 20 14    Temp: 98 F (36.7 C) 98.6 F (37 C) 97.9 F (36.6 C)   TempSrc: Oral Oral Oral   SpO2: 97% 94% 97% 98%  Weight:      Height:        Intake/Output Summary (Last 24 hours) at 11/11/2021 1335 Last data filed at 11/11/2021 0813 Gross per 24 hour  Intake 2701.65 ml  Output 1675 ml  Net 1026.65 ml     Wt Readings from Last 3 Encounters:  11/04/21 67.1 kg  11/02/21 67.1 kg  10/15/21 70.1 kg     Exam General: Alert and oriented x 3, NAD Cardiovascular: S1 S2 auscultated,  RRR Respiratory: CTA B, wound VAC in the left upper chest Gastrointestinal: Soft, nontender, nondistended, + bowel sounds Ext: no pedal edema bilaterally Neuro: Strength 5/5 upper and lower extremities bilaterally Psych: Normal affect and demeanor, alert and oriented x3     Data Reviewed:  I have personally reviewed following labs    CBC Lab Results  Component Value Date   WBC 7.4 11/10/2021   RBC 4.66 11/10/2021    HGB 13.1 11/10/2021   HCT 40.7 11/10/2021  MCV 87.3 11/10/2021   MCH 28.1 11/10/2021   PLT 300 11/10/2021   MCHC 32.2 11/10/2021   RDW 13.1 11/10/2021   LYMPHSABS 1.8 11/10/2021   MONOABS 0.6 11/10/2021   EOSABS 0.3 11/10/2021   BASOSABS 0.1 11/10/2021     Last metabolic panel Lab Results  Component Value Date   NA 137 11/10/2021   K 3.7 11/10/2021   CL 105 11/10/2021   CO2 25 11/10/2021   BUN 10 11/10/2021   CREATININE 0.65 11/10/2021   GLUCOSE 107 (H) 11/10/2021   GFRNONAA >60 11/10/2021   CALCIUM 8.5 (L) 11/10/2021   PROT 7.3 11/05/2021   ALBUMIN 3.1 (L) 11/05/2021   BILITOT 0.5 11/05/2021   ALKPHOS 59 11/05/2021   AST 20 11/05/2021   ALT 21 11/05/2021   ANIONGAP 7 11/10/2021    CBG (last 3)  Recent Labs    11/10/21 2035 11/11/21 0736 11/11/21 1122  GLUCAP 213* 95 100*      Coagulation Profile: Recent Labs  Lab 11/05/21 0941 11/05/21 1659  INR 1.1 1.1     Radiology Studies: I have personally reviewed the imaging studies  No results found.     Thad Ranger M.D. [facility] 11/11/2021, 1:35 PM  Available via Epic secure chat 7am-7pm After 7 pm, please refer to night coverage provider listed on amion.

## 2021-11-11 NOTE — Consult Note (Signed)
WOC Nurse wound follow up VAC dressing change to sternoclavicular joint.  Recent I & D.  Patient is premedicated prior to dressing change.  Wound type:infectious Measurement: 1 cm x 4 cm x 1.5 cm  Wound IFO:YDXA and moist Drainage (amount, consistency, odor) minimal serosanguinous  no odor.  Periwound: Drape is tightly sealed and is challenging to remove from this sensitive area.  Adhesive remover is generously applied and we are able to remove drape.  Sponge is easily retrieved from wound bed. There are 2 raised intact areas at 12 o'clock on periwound skin.  I will protect periwound skin with skin prep and  barrier ring to facilitate atraumatic dressing removal.  Patient tolerated procedure well and although he grimaced at times, said he was "ok" and it only hurt "a little" .  Wife at bedside as well.  Dressing procedure/placement/frequency:  2 slim pieces of black foam used to accommodate open space in wound bed.  Skin prep and barrier ring to periwound.  COvered with drape.  Seal achieved after smoothing around barrier ring.  Change MOn/Wed/Fri.  Discharge soon.  Supplies to be ordered Friday AM if still in house.  Will Follow.  Maple Hudson MSN, RN, FNP-BC CWON Wound, Ostomy, Continence Nurse Pager [phone]

## 2021-11-11 NOTE — Plan of Care (Signed)
?  Problem: Education: ?Goal: Knowledge of General Education information will improve ?Description: Including pain rating scale, medication(s)/side effects and non-pharmacologic comfort measures ?Outcome: Progressing ?  ?Problem: Health Behavior/Discharge Planning: ?Goal: Ability to manage health-related needs will improve ?Outcome: Progressing ?  ?Problem: Clinical Measurements: ?Goal: Diagnostic test results will improve ?Outcome: Progressing ?  ?Problem: Pain Managment: ?Goal: General experience of comfort will improve ?Outcome: Progressing ?  ?Problem: Skin Integrity: ?Goal: Risk for impaired skin integrity will decrease ?Outcome: Progressing ?  ?

## 2021-11-11 NOTE — Progress Notes (Addendum)
      [address]       [place] [address]             [phone]        5 Days Post-Op Procedure(s) (LRB): IRRIGATION AND DEBRIDEMENT STERNOCLAVICULAR JOINT ABSCESS (Left) APPLICATION OF WOUND VAC (Left)  Subjective: Patient without specific complaint this am. He is wondering at what time wound VAC will be changed. I discussed with him I am unsure but would be after 9:00 am sometime today.  Objective: Vital signs in last 24 hours: Temp:  [97.9 F (36.6 C)-98.6 F (37 C)] 97.9 F (36.6 C) (06/28 0437) Pulse Rate:  [82-100] 82 (06/28 0437) Cardiac Rhythm: Sinus tachycardia (06/27 1945) Resp:  [14-20] 14 (06/28 0437) BP: (132-148)/(87-96) 135/93 (06/28 0437) SpO2:  [94 %-97 %] 97 % (06/28 0437)   Current Weight  11/04/21 67.1 kg      Intake/Output from previous day: 06/27 0701 - 06/28 0700 In: 2701.7 [P.O.:720; I.V.:1584.2; IV Piggyback:397.5] Out: 2375 [Urine:2300; Drains:75]   Physical Exam:  Cardiovascular: RRR Pulmonary: Clear to auscultation bilaterally Extremities: No lower extremity edema. Wounds: VAC in place . No surrounding erythema.  Lab Results: CBC: Recent Labs    11/10/21 0458  WBC 7.4  HGB 13.1  HCT 40.7  PLT 300    BMET:  Recent Labs    11/10/21 0458  NA 137  K 3.7  CL 105  CO2 25  GLUCOSE 107*  BUN 10  CREATININE 0.65  CALCIUM 8.5*     PT/INR:  Lab Results  Component Value Date   INR 1.1 11/05/2021   INR 1.1 11/05/2021   INR 1.0 10/19/2021   ABG:  INR: Will add last result for INR, ABG once components are confirmed Will add last 4 CBG results once components are confirmed  Assessment/Plan:  1. CV - SR. On Lisinopril 5 mg daily. 2.  Pulmonary - On room air. 3. ID-Remains afebrile. On Cefazolin and Flagyl for septic arthritis/liver abscess. Gram stain of left SCV joint shows no organisms and culture shows rare Klebsiella Pneumoniae 4. History of DM-CBGs 202/154/117. On Metformin 500 mg bid and  Insulin. Per medicine. 5. Wound VAC change per wound care team;VAC to be changed to Monday/Wednesday/Friday  Birt Reinoso M ZimmermanPA-C 7:42 AM

## 2021-11-12 LAB — CBC WITH DIFFERENTIAL/PLATELET
Abs Immature Granulocytes: 0.05 10*3/uL (ref 0.00–0.07)
Basophils Absolute: 0.1 10*3/uL (ref 0.0–0.1)
Basophils Relative: 1 %
Eosinophils Absolute: 0.3 10*3/uL (ref 0.0–0.5)
Eosinophils Relative: 4 %
HCT: 43.4 % (ref 39.0–52.0)
Hemoglobin: 13.9 g/dL (ref 13.0–17.0)
Immature Granulocytes: 1 %
Lymphocytes Relative: 26 %
Lymphs Abs: 2 10*3/uL (ref 0.7–4.0)
MCH: 27.9 pg (ref 26.0–34.0)
MCHC: 32 g/dL (ref 30.0–36.0)
MCV: 87.1 fL (ref 80.0–100.0)
Monocytes Absolute: 0.6 10*3/uL (ref 0.1–1.0)
Monocytes Relative: 8 %
Neutro Abs: 4.8 10*3/uL (ref 1.7–7.7)
Neutrophils Relative %: 60 %
Platelets: 344 10*3/uL (ref 150–400)
RBC: 4.98 MIL/uL (ref 4.22–5.81)
RDW: 13.3 % (ref 11.5–15.5)
WBC: 7.8 10*3/uL (ref 4.0–10.5)
nRBC: 0 % (ref 0.0–0.2)

## 2021-11-12 LAB — GLUCOSE, CAPILLARY
Glucose-Capillary: 87 mg/dL (ref 70–99)
Glucose-Capillary: 87 mg/dL (ref 70–99)
Glucose-Capillary: 157 mg/dL — ABNORMAL HIGH (ref 70–99)
Glucose-Capillary: 100 mg/dL — ABNORMAL HIGH (ref 70–99)

## 2021-11-12 NOTE — Progress Notes (Signed)
      [address]       [place] [address]             [phone]        6 Days Post-Op Procedure(s) (LRB): IRRIGATION AND DEBRIDEMENT STERNOCLAVICULAR JOINT ABSCESS (Left) APPLICATION OF WOUND VAC (Left)  Subjective: Wound VAC stopped working yesterday. Wet to dry dressing on this am.  Objective: Vital signs in last 24 hours: Temp:  [98 F (36.7 C)-98.4 F (36.9 C)] 98.4 F (36.9 C) (06/29 0456) Pulse Rate:  [70-91] 88 (06/29 0456) Cardiac Rhythm: Normal sinus rhythm (06/28 1957) Resp:  [13-21] 15 (06/29 0456) BP: (137-146)/(91-96) 140/91 (06/29 0456) SpO2:  [89 %-98 %] 89 % (06/28 2000)   Current Weight  11/04/21 67.1 kg      Intake/Output from previous day: 06/28 0701 - 06/29 0700 In: 2524 [P.O.:600; I.V.:1723.3; IV Piggyback:200.8] Out: 700 [Urine:700]   Physical Exam:  Cardiovascular: RRR Wounds: Some sloughy tissue superficial wound. There is granulation tissue. No surrounding erythema.  Lab Results: CBC: Recent Labs    11/10/21 0458 11/12/21 0408  WBC 7.4 7.8  HGB 13.1 13.9  HCT 40.7 43.4  PLT 300 344    BMET:  Recent Labs    11/10/21 0458  NA 137  K 3.7  CL 105  CO2 25  GLUCOSE 107*  BUN 10  CREATININE 0.65  CALCIUM 8.5*     PT/INR:  Lab Results  Component Value Date   INR 1.1 11/05/2021   INR 1.1 11/05/2021   INR 1.0 10/19/2021   ABG:  INR: Will add last result for INR, ABG once components are confirmed Will add last 4 CBG results once components are confirmed  Assessment/Plan:  1. CV - SR. On Lisinopril 5 mg daily. 2.  Pulmonary - On room air. 3. ID-Remains afebrile. On Cefazolin or septic arthritis/liver abscess. Gram stain of left SCV joint shows no organisms and culture shows rare Klebsiella Pneumoniae 4. History of DM-CBGs 98/129/87. On Metformin 500 mg bid and Insulin. Per medicine. 5. Wound VAC change per wound care team today  Dyllin Gulley M ZimmermanPA-C 7:21 AM

## 2021-11-12 NOTE — TOC Progression Note (Signed)
Transition of Care [facility]) - Progression Note    Patient Details  Name: Benjamin Thompson MRN: [id number] Date of Birth: 10/25/1970  Transition [facility]) CM/SW Contact  Graves-Bigelow, Lamar Laundry, RN Phone Number: 11/12/2021, 2:32 PM  Clinical Narrative:  Patient was discussed in progression rounds and patient is in need of wound vac for home. Case Manager spoke with [facility] with [employer] @ [phone]. The spanish application for charity has been faxed and the patient is filling the application out. MD order placed in the shadow chart and will need to be filled out via the MD. Case Manager has called several agencies for [facility] RN care for the wound vac changes. Having difficulty   due to no insurance and new wound vac. Case Manager called [facility]-unable to service, [facility] unable to accept, [facility]-unable to service, [facility]- unable to accept, [facility]-unable to service. Awaiting call back from [place], [place]. Case Manager spoke with interpreter/patient via video interpreter and he had questions regarding output from the wound vac- stating he is not seeing any drainage. Patient states he has a PCP at the [facility] and the appointment is soon. Patient recently utilized [facility] and Case Manager will not be able to assist with medications at discharge if he has any new Rx's.    [facility] will not be able to assist with [facility] RN services for the wound vac.   Expected Discharge Plan: Home w Home Health Services Barriers to Discharge: Inadequate or no insurance  Expected Discharge Plan and Services Expected Discharge Plan: Home w Home Health Services In-house Referral: PCP / Health Connect, Interpreting Services Discharge Planning Services: CM Consult, [facility]igent [facility] Post Acute Care Choice: Home Health Living arrangements for the past 2 months: Apartment                 DME Arranged: Negative pressure wound device DME Agency: [employer] Date DME Agency Contacted: 11/12/21 Time DME Agency  Contacted: 1200 Representative spoke with at DME Agency: [facility]- charity form being filled out. Needs order to be filled out. HH Arranged: RN      Readmission Risk Interventions    10/20/2021   12:02 PM  Readmission Risk Prevention Plan  Post Dischage Appt Not Complete  Appt Comments f/u with [facility] 7/3  Medication Screening Complete  Transportation Screening Complete

## 2021-11-12 NOTE — Progress Notes (Signed)
[employer] Wd Vac alarming possible blockage. Collection cannister changed. Clamps open. Pt repositioned to [facility]. Unable to clear blockage alarm with interventions. Dr Cliffton Asters notified. Wd Vac turned off. CTS to change dressing in AM.

## 2021-11-12 NOTE — Progress Notes (Signed)
[facility]                                                                              [name], is a 51 y.o. male, DOB - 1970/06/30, QAS:[id number] Admit date - 11/04/2021    Outpatient Primary MD for the patient is Patient, No Pcp Per  LOS - 8  days  Chief Complaint  Patient presents with   Abscess   Chest Pain       Brief summary   Benjamin Thompson is a 51 y.o. male with PMH significant for DM2, HTN. Patient was recently hospitalized 5/31-6/6 for liver abscess that was drained, found to have Klebsiella pneumonia bacteremia and was discharged on oral cefadroxil and Flagyl for 4 weeks to complete on 7/2. Patient presented to the ED on 6/21 with 3 weeks of left shoulder pain, swelling. Findings suggestive of of septic arthritis of left sternoclavicular joint.  ID, cardiovascular surgery consulted on admission.   Started on antibiotics.   6/23, patient underwent surgical debridement of infected left sternoclavicular joint and wound VAC placement by cardiovascular surgery.    Assessment & Plan    Principal Problem:   Septic arthritis of sternoclavicular joint, left (HCC) -Status post I&D and drainage with wound VAC placement by CT VS -Wound cultures grew Klebsiella pneumonia -ID recommended IV Ancef while inpatient.  Can use oral Bactrim DS 2 DS tabs twice daily for 1 month at DC. -Wound VAC changed to Monday Wednesday Friday reapplied antibiotic wound care -TOC assisting with portable VAC -Continue pain control  Active Problems:   Bacteremia due to Klebsiella pneumoniae -ID recommendations as above, continue IV Ancef while inpatient    Uncontrolled type 2 diabetes mellitus with hyperglycemia, without long-term current use of insulin (HCC) -Hemoglobin A1c 13.1 -During previous admission, patient was discharged on Semglee 40 units daily and metformin Recent Labs    11/11/21 0736 11/11/21 1122 11/11/21 1659 11/11/21 2003 11/12/21 0743  11/12/21 1116  GLUCAP 95 100* 98 129* 87 87    CBGs controlled, continue current regimen    Essential hypertension -Continue lisinopril 5 mg daily  Hyperlipidemia Continue Lipitor  Estimated body mass index is 26.22 kg/m as calculated from the following:   Height as of this encounter: 5\' 3"  (1.6 m).   Weight as of this encounter: 67.1 kg.  Code Status: Full code DVT Prophylaxis:  SCDs Start: 11/05/21 0313   Level of Care: Level of care: Med-Surg Family Communication: Updated patient's wife at the bedside today  Disposition Plan:      Remains inpatient appropriate:  Will need portable wound care vac for discharge.  DC when portable wound VAC available.   Procedures:  -Status post I&D and drainage with wound VAC placement by CT VS  Consultants:   Infectious disease CT VS  Antimicrobials:   Anti-infectives (From admission, onward)    Start     Dose/Rate Route Frequency Ordered Stop   11/10/21 0000  sulfamethoxazole-trimethoprim (BACTRIM DS) 800-160 MG tablet        2 tablet Oral 2 times daily 11/10/21 1359 12/10/21 2359   11/06/21 1710  vancomycin (VANCOCIN) 1,000 mg in sodium  chloride 0.9 % 1,000 mL irrigation  Status:  Discontinued          As needed 11/06/21 1712 11/06/21 1737   11/06/21 1200  cefOXitin (MEFOXIN) 2 g in sodium chloride 0.9 % 100 mL IVPB        2 g 200 mL/hr over 30 Minutes Intravenous 60 min pre-op 11/05/21 1622 11/06/21 1821   11/05/21 0900  metroNIDAZOLE (FLAGYL) tablet 500 mg  Status:  Discontinued        500 mg Oral Every 12 hours 11/05/21 0323 11/10/21 0929   11/05/21 0600  metroNIDAZOLE (FLAGYL) tablet 500 mg  Status:  Discontinued        500 mg Oral Every 8 hours 11/05/21 0312 11/05/21 0323   11/05/21 0500  ceFAZolin (ANCEF) IVPB 2g/100 mL premix        2 g 200 mL/hr over 30 Minutes Intravenous Every 8 hours 11/05/21 0326     11/05/21 0315  ceFAZolin (ANCEF) IVPB 2g/100 mL premix  Status:  Discontinued        2 g 200 mL/hr over 30 Minutes  Intravenous  Once 11/05/21 0312 11/05/21 0326   11/04/21 2000  ceFAZolin (ANCEF) IVPB 2g/100 mL premix        2 g 200 mL/hr over 30 Minutes Intravenous  Once 11/04/21 1949 11/04/21 2153   11/04/21 1945  metroNIDAZOLE (FLAGYL) tablet 500 mg  Status:  Discontinued        500 mg Oral Every 12 hours 11/04/21 1930 11/05/21 0312          Medications  atorvastatin  20 mg Oral Daily   insulin aspart  0-15 Units Subcutaneous TID WC   insulin aspart  0-5 Units Subcutaneous QHS   insulin glargine-yfgn  40 Units Subcutaneous Daily   lisinopril  5 mg Oral Daily   metFORMIN  500 mg Oral BID WC      Subjective:   Benjamin Thompson was seen and examined today am.  Wound VAC stopped working yesterday. +pain in the left shoulder and upper chest area otherwise no fevers.  No acute shortness of breath, nausea vomiting or abdominal pain.  Objective:   Vitals:   11/11/21 1424 11/11/21 2000 11/12/21 0456 11/12/21 0802  BP: (!) 146/96 (!) 137/92 (!) 140/91   Pulse: 91 70 88 90  Resp: 13 (!) 21 15 12   Temp: 98 F (36.7 C) 98.4 F (36.9 C) 98.4 F (36.9 C)   TempSrc: Oral Oral Oral   SpO2: 97% (!) 89%  98%  Weight:      Height:        Intake/Output Summary (Last 24 hours) at 11/12/2021 1232 Last data filed at 11/12/2021 0802 Gross per 24 hour  Intake 2524.04 ml  Output 700 ml  Net 1824.04 ml     Wt Readings from Last 3 Encounters:  11/04/21 67.1 kg  11/02/21 67.1 kg  10/15/21 70.1 kg   Physical Exam General: Alert and oriented x 3, NAD Cardiovascular: S1 S2 clear, RRR. No pedal edema b/l Respiratory: CTAB, no wheezing, rales or rhonchi, wound VAC in the left upper chest Gastrointestinal: Soft, nontender, nondistended, NBS Ext: no pedal edema bilaterally Neuro: no new deficits Psych: Normal affect and demeanor, alert and oriented x3      Data Reviewed:  I have personally reviewed following labs    CBC Lab Results  Component Value Date   WBC 7.8 11/12/2021   RBC 4.98  11/12/2021   HGB 13.9 11/12/2021   HCT 43.4 11/12/2021  MCV 87.1 11/12/2021   MCH 27.9 11/12/2021   PLT 344 11/12/2021   MCHC 32.0 11/12/2021   RDW 13.3 11/12/2021   LYMPHSABS 2.0 11/12/2021   MONOABS 0.6 11/12/2021   EOSABS 0.3 11/12/2021   BASOSABS 0.1 0000000     Last metabolic panel Lab Results  Component Value Date   NA 137 11/10/2021   K 3.7 11/10/2021   CL 105 11/10/2021   CO2 25 11/10/2021   BUN 10 11/10/2021   CREATININE 0.65 11/10/2021   GLUCOSE 107 (H) 11/10/2021   GFRNONAA >60 11/10/2021   CALCIUM 8.5 (L) 11/10/2021   PROT 7.3 11/05/2021   ALBUMIN 3.1 (L) 11/05/2021   BILITOT 0.5 11/05/2021   ALKPHOS 59 11/05/2021   AST 20 11/05/2021   ALT 21 11/05/2021   ANIONGAP 7 11/10/2021    CBG (last 3)  Recent Labs    11/11/21 2003 11/12/21 0743 11/12/21 1116  GLUCAP 129* 87 87      Coagulation Profile: Recent Labs  Lab 11/05/21 1659  INR 1.1     Radiology Studies: I have personally reviewed the imaging studies  No results found.     Estill Cotta M.D. [facility] 11/12/2021, 12:32 PM  Available via Epic secure chat 7am-7pm After 7 pm, please refer to night coverage provider listed on amion.

## 2021-11-12 NOTE — Consult Note (Signed)
WOC Nurse wound follow up Wound type: Surgical wound, I & D to sternoclavicular joint infection.  Was informed that VAC wound not seal last evening and I advised bedside staff to remove VAC dressing and replace with NS moist gauze.  I will replace VAC dressing this AM.  Patient was under the impression that the machine malfunctioned yesterday.  Its unclear if the battery went dead and seal was lost, etc.  Device is plugged in charging now.  Measurement:unchanged  see 11/12/21 wound measurements Wound bed: pale pink and moist  Drainage (amount, consistency, odor) serosanguinous effluent to gauze packed into wound defect Periwound: Raised lesions are resolved to periwound from yesterday.  I protect skin with skin protectant and will not use barrier ring.  Dressing procedure/placement/frequency: Replaced 1 piece black foam to wound bed. Periwound edges protected with skin prep. COvered with drape and seal immediately achieved. Spoke with Doree Fudge, PA with cardiothoracic services who agrees that next [facility] change will be due 11/16/21 (Monday).  Discharge is imminent, it is unclear if NPWT has been approved for home use at this point.  Once delivered to room, device can be connected to unit without dressing change to prepare for discharge. IF NPWT not approved, for home the [facility] will be removed and NS moist gauze will be implemented twice daily.  Will follow.  Maple Hudson MSN, RN, FNP-BC CWON Wound, Ostomy, Continence Nurse Pager [phone]

## 2021-11-13 LAB — BASIC METABOLIC PANEL
Anion gap: 9 (ref 5–15)
BUN: 14 mg/dL (ref 6–20)
CO2: 28 mmol/L (ref 22–32)
Calcium: 9.2 mg/dL (ref 8.9–10.3)
Chloride: 104 mmol/L (ref 98–111)
Creatinine, Ser: 0.84 mg/dL (ref 0.61–1.24)
GFR, Estimated: >60 mL/min (ref >60)
Glucose, Bld: 77 mg/dL (ref 70–99)
Potassium: 3.5 mmol/L (ref 3.5–5.1)
Sodium: 141 mmol/L (ref 135–145)

## 2021-11-13 LAB — GLUCOSE, CAPILLARY
Glucose-Capillary: 73 mg/dL (ref 70–99)
Glucose-Capillary: 86 mg/dL (ref 70–99)
Glucose-Capillary: 142 mg/dL — ABNORMAL HIGH (ref 70–99)
Glucose-Capillary: 135 mg/dL — ABNORMAL HIGH (ref 70–99)

## 2021-11-13 NOTE — TOC Progression Note (Addendum)
  Transition of Care [facility]) - Progression Note    Patient Details  Name: Benjamin Thompson MRN: [id number] Date of Birth: January 07, 1971  Transition [facility] Care [facility]) CM/SW Contact  Graves-Bigelow, Lamar Laundry, RN Phone Number: 11/13/2021, 9:25 AM  Clinical Narrative: Case Manager spoke with TCTS Doree Fudge PA- to make her aware that arranging home health for the patients wound vac has been difficult due to no insurance. PA stated that the patient will need to stay until Monday to see if he will need to continue with the wound vac. PA states the wound is deep-unsure about wet-to-dry at this time for home vs vac changes in the office. Case Manager received a call from [facility] to state that they cannot assist with charity home health as well. Case Manager will continue to follow for additional transition of care needs.   11-13-21 1532 VAC orders to be signed in shadow chart and [employer] charity form needs to be faxed to [employer] (shadow chart). Following to see if will need VAC for home.   Expected Discharge Plan: Home/Self Care Barriers to Discharge: Inadequate or no insurance  Expected Discharge Plan and Services Expected Discharge Plan: Home/Self Care In-house Referral: PCP / Health Connect, Interpreting Services Discharge Planning Services: CM Consult, Indigent Health Clinic Post Acute Care Choice: Home Health Living arrangements for the past 2 months: Apartment                 DME Arranged: Negative pressure wound device DME Agency: [employer] Date DME Agency Contacted: 11/12/21 Time DME Agency Contacted: 1200 Representative spoke with at DME Agency: [facility]- charity form being filled out. Needs order to be filled out. HH Arranged: RN       Readmission Risk Interventions    10/20/2021   12:02 PM  Readmission Risk Prevention Plan  Post Dischage Appt Not Complete  Appt Comments f/u with [facility] 7/3  Medication Screening Complete  Transportation Screening Complete

## 2021-11-13 NOTE — Progress Notes (Signed)
[facility]                                                                              Benjamin Thompson, is a 51 y.o. male, DOB - 06/26/1970, BOF:[id number] Admit date - 11/04/2021    Outpatient Primary MD for the patient is Patient, No Pcp Per  LOS - 9  days  Chief Complaint  Patient presents with   Abscess   Chest Pain       Brief summary   Benjamin Thompson is a 51 y.o. male with PMH significant for DM2, HTN. Patient was recently hospitalized 5/31-6/6 for liver abscess that was drained, found to have Klebsiella pneumonia bacteremia and was discharged on oral cefadroxil and Flagyl for 4 weeks to complete on 7/2. Patient presented to the ED on 6/21 with 3 weeks of left shoulder pain, swelling. Findings suggestive of of septic arthritis of left sternoclavicular joint.  ID, cardiovascular surgery consulted on admission.   Started on antibiotics.   6/23, patient underwent surgical debridement of infected left sternoclavicular joint and wound VAC placement by cardiovascular surgery.    Assessment & Plan    Principal Problem:   Septic arthritis of sternoclavicular joint, left (HCC) -Status post I&D and drainage with wound VAC placement by CT VS -Wound cultures grew Klebsiella pneumonia -ID recommended IV Ancef while inpatient.  Can use oral Bactrim DS 2 DS tabs twice daily for 1 month at DC. -TOC assisting with portable VAC -Continue pain control -Appreciate CT VS recommendations, recommended continue IV antibiotics, wound VAC over the weekend.    Active Problems:   Bacteremia due to Klebsiella pneumoniae -ID recommendations as above, continue IV Ancef while inpatient    Uncontrolled type 2 diabetes mellitus with hyperglycemia, without long-term current use of insulin (HCC) -Hemoglobin A1c 13.1 -During previous admission, patient was discharged on Semglee 40 units daily and metformin Recent Labs    11/12/21 0743 11/12/21 1116 11/12/21 1737 11/12/21 2203  11/13/21 0743 11/13/21 1145  GLUCAP 87 87 157* 100* 73 86  -CBGs controlled    Essential hypertension -Continue lisinopril 5 mg daily  Hyperlipidemia Continue Lipitor  Estimated body mass index is 26.22 kg/m as calculated from the following:   Height as of this encounter: 5\' 3"  (1.6 m).   Weight as of this encounter: 67.1 kg.  Code Status: Full code DVT Prophylaxis:  SCDs Start: 11/05/21 0313   Level of Care: Level of care: Med-Surg Family Communication: Updated patient's wife at the bedside  Disposition Plan:      Remains inpatient appropriate:  Will need portable wound care vac for discharge.    Procedures:  -Status post I&D and drainage with wound VAC placement by CT VS  Consultants:   Infectious disease CT VS  Antimicrobials:   Anti-infectives (From admission, onward)    Start     Dose/Rate Route Frequency Ordered Stop   11/10/21 0000  sulfamethoxazole-trimethoprim (BACTRIM DS) 800-160 MG tablet        2 tablet Oral 2 times daily 11/10/21 1359 12/10/21 2359   11/06/21 1710  vancomycin (VANCOCIN) 1,000 mg in sodium chloride 0.9 % 1,000 mL irrigation  Status:  Discontinued          As needed 11/06/21 1712 11/06/21 1737   11/06/21 1200  cefOXitin (MEFOXIN) 2 g in sodium chloride 0.9 % 100 mL IVPB        2 g 200 mL/hr over 30 Minutes Intravenous 60 min pre-op 11/05/21 1622 11/06/21 1821   11/05/21 0900  metroNIDAZOLE (FLAGYL) tablet 500 mg  Status:  Discontinued        500 mg Oral Every 12 hours 11/05/21 0323 11/10/21 0929   11/05/21 0600  metroNIDAZOLE (FLAGYL) tablet 500 mg  Status:  Discontinued        500 mg Oral Every 8 hours 11/05/21 0312 11/05/21 0323   11/05/21 0500  ceFAZolin (ANCEF) IVPB 2g/100 mL premix        2 g 200 mL/hr over 30 Minutes Intravenous Every 8 hours 11/05/21 0326     11/05/21 0315  ceFAZolin (ANCEF) IVPB 2g/100 mL premix  Status:  Discontinued        2 g 200 mL/hr over 30 Minutes Intravenous  Once 11/05/21 0312 11/05/21 0326    11/04/21 2000  ceFAZolin (ANCEF) IVPB 2g/100 mL premix        2 g 200 mL/hr over 30 Minutes Intravenous  Once 11/04/21 1949 11/04/21 2153   11/04/21 1945  metroNIDAZOLE (FLAGYL) tablet 500 mg  Status:  Discontinued        500 mg Oral Every 12 hours 11/04/21 1930 11/05/21 0312          Medications  atorvastatin  20 mg Oral Daily   insulin aspart  0-15 Units Subcutaneous TID WC   insulin aspart  0-5 Units Subcutaneous QHS   insulin glargine-yfgn  40 Units Subcutaneous Daily   lisinopril  5 mg Oral Daily   metFORMIN  500 mg Oral BID WC      Subjective:   Benjamin Thompson was seen and examined today am.  Per patient pain improved today, no acute complaints.  Overnight no acute issues, no fevers or chills.  Wife at the bedside.    Objective:   Vitals:   11/12/21 1520 11/12/21 2043 11/13/21 0458 11/13/21 1351  BP:  (!) 131/91 (!) 129/95 (!) 145/97  Pulse:  84 71 88  Resp: 18 18 18 16   Temp:  97.6 F (36.4 C) 97.6 F (36.4 C) (!) 97.5 F (36.4 C)  TempSrc:  Oral Oral Axillary  SpO2:  (!) 88% 98% 100%  Weight:      Height:        Intake/Output Summary (Last 24 hours) at 11/13/2021 1453 Last data filed at 11/13/2021 1353 Gross per 24 hour  Intake 2882.56 ml  Output 1550 ml  Net 1332.56 ml     Wt Readings from Last 3 Encounters:  11/04/21 67.1 kg  11/02/21 67.1 kg  10/15/21 70.1 kg   Physical Exam General: Alert and oriented x 3, NAD Cardiovascular: S1 S2 clear, RRR. No pedal edema b/l Respiratory: CTAB, no wheezing, wound VAC in left upper chest Gastrointestinal: Soft, nontender, nondistended, NBS Ext: no pedal edema bilaterally Psych: Normal affect and demeanor, alert and oriented x3      Data Reviewed:  I have personally reviewed following labs    CBC Lab Results  Component Value Date   WBC 7.8 11/12/2021   RBC 4.98 11/12/2021   HGB 13.9 11/12/2021   HCT 43.4 11/12/2021   MCV 87.1 11/12/2021   MCH 27.9 11/12/2021   PLT 344 11/12/2021   MCHC 32.0  11/12/2021  RDW 13.3 11/12/2021   LYMPHSABS 2.0 11/12/2021   MONOABS 0.6 11/12/2021   EOSABS 0.3 11/12/2021   BASOSABS 0.1 11/12/2021     Last metabolic panel Lab Results  Component Value Date   NA 141 11/13/2021   K 3.5 11/13/2021   CL 104 11/13/2021   CO2 28 11/13/2021   BUN 14 11/13/2021   CREATININE 0.84 11/13/2021   GLUCOSE 77 11/13/2021   GFRNONAA >60 11/13/2021   CALCIUM 9.2 11/13/2021   PROT 7.3 11/05/2021   ALBUMIN 3.1 (L) 11/05/2021   BILITOT 0.5 11/05/2021   ALKPHOS 59 11/05/2021   AST 20 11/05/2021   ALT 21 11/05/2021   ANIONGAP 9 11/13/2021    CBG (last 3)  Recent Labs    11/12/21 2203 11/13/21 0743 11/13/21 1145  GLUCAP 100* 73 86         Ronney Honeywell M.D. [facility] 11/13/2021, 2:53 PM  Available via Epic secure chat 7am-7pm After 7 pm, please refer to night coverage provider listed on amion.

## 2021-11-13 NOTE — Progress Notes (Addendum)
      [address]       [place] [address]             [phone]        7 Days Post-Op Procedure(s) (LRB): IRRIGATION AND DEBRIDEMENT STERNOCLAVICULAR JOINT ABSCESS (Left) APPLICATION OF WOUND VAC (Left)  Subjective: Wound VAC changed yesterday as it was not working. He was sleeping this am and awakened  Objective: Vital signs in last 24 hours: Temp:  [97.6 F (36.4 C)] 97.6 F (36.4 C) (06/30 0458) Pulse Rate:  [71-98] 71 (06/30 0458) Cardiac Rhythm: Normal sinus rhythm (06/29 1950) Resp:  [12-20] 18 (06/30 0458) BP: (129-131)/(90-95) 129/95 (06/30 0458) SpO2:  [88 %-98 %] 98 % (06/30 0458)   Current Weight  11/04/21 67.1 kg      Intake/Output from previous day: 06/29 0701 - 06/30 0700 In: 2470.1 [P.O.:480; I.V.:1627.4; IV Piggyback:362.7] Out: 1550 [Urine:1550]   Physical Exam:  Cardiovascular: RRR Wounds: Wound VAC in place and functioning well  Lab Results: CBC: Recent Labs    11/12/21 0408  WBC 7.8  HGB 13.9  HCT 43.4  PLT 344    BMET:  Recent Labs    11/13/21 0016  NA 141  K 3.5  CL 104  CO2 28  GLUCOSE 77  BUN 14  CREATININE 0.84  CALCIUM 9.2     PT/INR:  Lab Results  Component Value Date   INR 1.1 11/05/2021   INR 1.1 11/05/2021   INR 1.0 10/19/2021   ABG:  INR: Will add last result for INR, ABG once components are confirmed Will add last 4 CBG results once components are confirmed  Assessment/Plan:  1. CV - SR. On Lisinopril 5 mg daily. 2.  Pulmonary - On room air. 3. ID-Remains afebrile. On Cefazolin or septic arthritis/liver abscess. Gram stain of left SCV joint shows no organisms and culture shows rare Klebsiella Pneumoniae 4. History of DM-CBGs 87/157/100. On Metformin 500 mg bid and Insulin. Per medicine. 5. Wound VAC change to be done Monday;then will continue Monday/Wednesday/Friday  Donielle M ZimmermanPA-C 7:28 AM Patient states left mid chest pain much improved after debridement and  continued IV antibiotics.  Wound VAC system inspected and is working at -125 mmHg suction.  Agree with plan for continuing wound VAC with IV antibiotics through the weekend then discharge home if patient can qualify for home wound VAC services.  If not the patient's wife needs to be instructed on daily wet-to-dry dressing changes.  We will follow patient in office after discharge to continue wound therapy.  patient examined and medical record reviewed,agree with above note. Lovett Sox 11/13/2021

## 2021-11-14 LAB — CBC WITH DIFFERENTIAL/PLATELET
Abs Immature Granulocytes: 0.04 10*3/uL (ref 0.00–0.07)
Basophils Absolute: 0.1 10*3/uL (ref 0.0–0.1)
Basophils Relative: 1 %
Eosinophils Absolute: 0.2 10*3/uL (ref 0.0–0.5)
Eosinophils Relative: 3 %
HCT: 38.6 % — ABNORMAL LOW (ref 39.0–52.0)
Hemoglobin: 12.7 g/dL — ABNORMAL LOW (ref 13.0–17.0)
Immature Granulocytes: 1 %
Lymphocytes Relative: 29 %
Lymphs Abs: 1.9 10*3/uL (ref 0.7–4.0)
MCH: 28.7 pg (ref 26.0–34.0)
MCHC: 32.9 g/dL (ref 30.0–36.0)
MCV: 87.1 fL (ref 80.0–100.0)
Monocytes Absolute: 0.6 10*3/uL (ref 0.1–1.0)
Monocytes Relative: 9 %
Neutro Abs: 3.8 10*3/uL (ref 1.7–7.7)
Neutrophils Relative %: 57 %
Platelets: 271 10*3/uL (ref 150–400)
RBC: 4.43 MIL/uL (ref 4.22–5.81)
RDW: 13.2 % (ref 11.5–15.5)
WBC: 6.5 10*3/uL (ref 4.0–10.5)
nRBC: 0 % (ref 0.0–0.2)

## 2021-11-14 LAB — GLUCOSE, CAPILLARY
Glucose-Capillary: 133 mg/dL — ABNORMAL HIGH (ref 70–99)
Glucose-Capillary: 101 mg/dL — ABNORMAL HIGH (ref 70–99)
Glucose-Capillary: 139 mg/dL — ABNORMAL HIGH (ref 70–99)
Glucose-Capillary: 146 mg/dL — ABNORMAL HIGH (ref 70–99)

## 2021-11-14 NOTE — Progress Notes (Signed)
[facility]                                                                              Benjamin Thompson, is a 51 y.o. male, DOB - 07/10/1970, SJG:[id number] Admit date - 11/04/2021    Outpatient Primary MD for the patient is Patient, No Pcp Per  LOS - 10  days  Chief Complaint  Patient presents with   Abscess   Chest Pain       Brief summary   Benjamin Thompson is a 51 y.o. male with PMH significant for DM2, HTN. Patient was recently hospitalized 5/31-6/6 for liver abscess that was drained, found to have Klebsiella pneumonia bacteremia and was discharged on oral cefadroxil and Flagyl for 4 weeks to complete on 7/2. Patient presented to the ED on 6/21 with 3 weeks of left shoulder pain, swelling. Findings suggestive of of septic arthritis of left sternoclavicular joint.  ID, cardiovascular surgery consulted on admission.   Started on antibiotics.   6/23, patient underwent surgical debridement of infected left sternoclavicular joint and wound VAC placement by cardiovascular surgery.    Assessment & Plan    Principal Problem:   Septic arthritis of sternoclavicular joint, left (HCC) -Status post I&D and drainage with wound VAC placement by CT VS -Wound cultures grew Klebsiella pneumonia -ID recommended IV Ancef while inpatient.  Can use oral Bactrim DS 2 DS tabs twice daily for 1 month at DC. -Continue pain control, wound VAC discontinued today, not functioning.  CT surgery following -Wife needs teaching on daily wet-to-dry dressing, will also benefit from [facility] RN  Active Problems:   Bacteremia due to Klebsiella pneumoniae -ID recommendations as above, continue IV Ancef while inpatient    Uncontrolled type 2 diabetes mellitus with hyperglycemia, without long-term current use of insulin (HCC) -Hemoglobin A1c 13.1 -During previous admission, patient was discharged on Semglee 40 units daily and metformin -CBG stable, continue current regimen    Essential  hypertension -Continue lisinopril 5 mg daily  Hyperlipidemia Continue Lipitor  Estimated body mass index is 26.22 kg/m as calculated from the following:   Height as of this encounter: 5\' 3"  (1.6 m).   Weight as of this encounter: 67.1 kg.  Code Status: Full code DVT Prophylaxis:  SCDs Start: 11/05/21 0313   Level of Care: Level of care: Med-Surg Family Communication: Updated patient's wife at the bedside today  Disposition Plan:      Remains inpatient appropriate: DC home likely on Monday.  Wife needs instruction on daily dressing changes  Procedures:  -Status post I&D and drainage with wound VAC placement by CT VS  Consultants:   Infectious disease CT VS  Antimicrobials:   Anti-infectives (From admission, onward)    Start     Dose/Rate Route Frequency Ordered Stop   11/10/21 0000  sulfamethoxazole-trimethoprim (BACTRIM DS) 800-160 MG tablet        2 tablet Oral 2 times daily 11/10/21 1359 12/10/21 2359   11/06/21 1710  vancomycin (VANCOCIN) 1,000 mg in sodium chloride 0.9 % 1,000 mL irrigation  Status:  Discontinued          As needed 11/06/21 1712 11/06/21 1737  11/06/21 1200  cefOXitin (MEFOXIN) 2 g in sodium chloride 0.9 % 100 mL IVPB        2 g 200 mL/hr over 30 Minutes Intravenous 60 min pre-op 11/05/21 1622 11/06/21 1821   11/05/21 0900  metroNIDAZOLE (FLAGYL) tablet 500 mg  Status:  Discontinued        500 mg Oral Every 12 hours 11/05/21 0323 11/10/21 0929   11/05/21 0600  metroNIDAZOLE (FLAGYL) tablet 500 mg  Status:  Discontinued        500 mg Oral Every 8 hours 11/05/21 0312 11/05/21 0323   11/05/21 0500  ceFAZolin (ANCEF) IVPB 2g/100 mL premix        2 g 200 mL/hr over 30 Minutes Intravenous Every 8 hours 11/05/21 0326     11/05/21 0315  ceFAZolin (ANCEF) IVPB 2g/100 mL premix  Status:  Discontinued        2 g 200 mL/hr over 30 Minutes Intravenous  Once 11/05/21 0312 11/05/21 0326   11/04/21 2000  ceFAZolin (ANCEF) IVPB 2g/100 mL premix        2 g 200  mL/hr over 30 Minutes Intravenous  Once 11/04/21 1949 11/04/21 2153   11/04/21 1945  metroNIDAZOLE (FLAGYL) tablet 500 mg  Status:  Discontinued        500 mg Oral Every 12 hours 11/04/21 1930 11/05/21 0312          Medications  atorvastatin  20 mg Oral Daily   insulin aspart  0-15 Units Subcutaneous TID WC   insulin aspart  0-5 Units Subcutaneous QHS   insulin glargine-yfgn  40 Units Subcutaneous Daily   lisinopril  5 mg Oral Daily   metFORMIN  500 mg Oral BID WC      Subjective:   Benjamin Thompson was seen and examined today am.  No acute complaints.  No fevers or chills.  Frustrated with the wound VAC not functioning.  Pain appears to be controlled.   Objective:   Vitals:   11/13/21 1351 11/13/21 2021 11/14/21 0504 11/14/21 0838  BP: (!) 145/97 (!) 144/95 (!) 139/94 (!) 162/96  Pulse: 88 86 78   Resp: 16 18    Temp: (!) 97.5 F (36.4 C) 98.8 F (37.1 C) 98.2 F (36.8 C)   TempSrc: Axillary Oral Oral   SpO2: 100% 96%    Weight:      Height:        Intake/Output Summary (Last 24 hours) at 11/14/2021 1234 Last data filed at 11/14/2021 1139 Gross per 24 hour  Intake 1754.05 ml  Output 2500 ml  Net -745.95 ml     Wt Readings from Last 3 Encounters:  11/04/21 67.1 kg  11/02/21 67.1 kg  10/15/21 70.1 kg   Physical Exam General: Alert and oriented x 3, NAD Cardiovascular: S1 S2 clear, RRR. No pedal edema b/l Respiratory: CTAB, no wheezing, wound VAC+ Gastrointestinal: Soft, nontender, nondistended, NBS Ext: no pedal edema bilaterally Neuro: no new deficits Psych: Normal affect and demeanor, alert and oriented x3      Data Reviewed:  I have personally reviewed following labs    CBC Lab Results  Component Value Date   WBC 6.5 11/14/2021   RBC 4.43 11/14/2021   HGB 12.7 (L) 11/14/2021   HCT 38.6 (L) 11/14/2021   MCV 87.1 11/14/2021   MCH 28.7 11/14/2021   PLT 271 11/14/2021   MCHC 32.9 11/14/2021   RDW 13.2 11/14/2021   LYMPHSABS 1.9 11/14/2021    MONOABS 0.6 11/14/2021   EOSABS  0.2 11/14/2021   BASOSABS 0.1 11/14/2021     Last metabolic panel Lab Results  Component Value Date   NA 141 11/13/2021   K 3.5 11/13/2021   CL 104 11/13/2021   CO2 28 11/13/2021   BUN 14 11/13/2021   CREATININE 0.84 11/13/2021   GLUCOSE 77 11/13/2021   GFRNONAA >60 11/13/2021   CALCIUM 9.2 11/13/2021   PROT 7.3 11/05/2021   ALBUMIN 3.1 (L) 11/05/2021   BILITOT 0.5 11/05/2021   ALKPHOS 59 11/05/2021   AST 20 11/05/2021   ALT 21 11/05/2021   ANIONGAP 9 11/13/2021    CBG (last 3)  Recent Labs    11/13/21 2114 11/14/21 0738 11/14/21 1138  GLUCAP 135* 133* 101*         Elke Holtry M.D. [facility] 11/14/2021, 12:34 PM  Available via Epic secure chat 7am-7pm After 7 pm, please refer to night coverage provider listed on amion.

## 2021-11-14 NOTE — Progress Notes (Signed)
Wound vac currently off at shift change. Received in report that wound vac has been off due to complications with wound vac function.    0800: Paged cardiothoracic on call and advised that wound vac was off at change of shift and has been off per night shift report. Cardiothoracic on call to come to bedside.

## 2021-11-14 NOTE — Progress Notes (Signed)
Patient medicated with Morphine. Wound VAC removed. Normal saline, wet to dry dressing applied. Left sternoclavicular wound is clean and dry. Granulation tissue present and no purulence or slough tissue noted.  Slight bloody ooze from fringe of skin. Patient tolerated dressing change

## 2021-11-14 NOTE — Progress Notes (Addendum)
      [address]       [place] [address]             [phone]        8 Days Post-Op Procedure(s) (LRB): IRRIGATION AND DEBRIDEMENT STERNOCLAVICULAR JOINT ABSCESS (Left) APPLICATION OF WOUND VAC (Left)  Subjective: Patient with some pain at wound VAC site  Objective: Vital signs in last 24 hours: Temp:  [97.5 F (36.4 C)-98.8 F (37.1 C)] 98.2 F (36.8 C) (07/01 0504) Pulse Rate:  [78-88] 78 (07/01 0504) Cardiac Rhythm: Normal sinus rhythm (06/30 1903) Resp:  [16-18] 18 (06/30 2021) BP: (139-145)/(94-97) 139/94 (07/01 0504) SpO2:  [96 %-100 %] 96 % (06/30 2021)   Current Weight  11/04/21 67.1 kg      Intake/Output from previous day: 06/30 0701 - 07/01 0700 In: 1754.1 [P.O.:240; I.V.:1242.7; IV Piggyback:271.4] Out: 1300 [Urine:1300]   Physical Exam:  Cardiovascular: RRR Wounds: Wound VAC in place and VAC turned off  Lab Results: CBC: Recent Labs    11/12/21 0408  WBC 7.8  HGB 13.9  HCT 43.4  PLT 344    BMET:  Recent Labs    11/13/21 0016  NA 141  K 3.5  CL 104  CO2 28  GLUCOSE 77  BUN 14  CREATININE 0.84  CALCIUM 9.2     PT/INR:  Lab Results  Component Value Date   INR 1.1 11/05/2021   INR 1.1 11/05/2021   INR 1.0 10/19/2021   ABG:  INR: Will add last result for INR, ABG once components are confirmed Will add last 4 CBG results once components are confirmed  Assessment/Plan:  1. CV - SR. On Lisinopril 5 mg daily. 2.  Pulmonary - On room air. 3. ID-Remains afebrile. On Cefazolin or septic arthritis/liver abscess. Gram stain of left SCV joint shows no organisms and culture shows rare Klebsiella Pneumoniae 4. History of DM-CBGs 142/135/133. On Metformin 500 mg bid and Insulin. Per medicine. 5. Wound VAC is not functioning and was turned off during last shift. Appears bottom of wound VAC does not have a proper seal. Will discontinue wound VAC and start daily dressing changes. 6. Regarding disposition, likely  home Monday. The patient's wife needs to be instructed on daily wet-to-dry dressing changes.  Corlette Ciano M ZimmermanPA-C 7:44 AM  Agree with plan as outlined above.

## 2021-11-15 LAB — GLUCOSE, CAPILLARY
Glucose-Capillary: 107 mg/dL — ABNORMAL HIGH (ref 70–99)
Glucose-Capillary: 116 mg/dL — ABNORMAL HIGH (ref 70–99)
Glucose-Capillary: 184 mg/dL — ABNORMAL HIGH (ref 70–99)
Glucose-Capillary: 157 mg/dL — ABNORMAL HIGH (ref 70–99)

## 2021-11-15 NOTE — Progress Notes (Signed)
[facility]                                                                              Benjamin Thompson, is a 51 y.o. male, DOB - [birth year], ZHG:[id number] Admit date - 11/04/2021    Outpatient Primary MD for the patient is Patient, No Pcp Per  LOS - 11  days  Chief Complaint  Patient presents with   Abscess   Chest Pain       Brief summary   Benjamin Thompson is a 51 y.o. male with PMH significant for DM2, HTN. Patient was recently hospitalized 5/31-6/6 for liver abscess that was drained, found to have Klebsiella pneumonia bacteremia and was discharged on oral cefadroxil and Flagyl for 4 weeks to complete on 7/2. Patient presented to the ED on 6/21 with 3 weeks of left shoulder pain, swelling. Findings suggestive of of septic arthritis of left sternoclavicular joint.  ID, cardiovascular surgery consulted on admission.   Started on antibiotics.   6/23, patient underwent surgical debridement of infected left sternoclavicular joint and wound VAC placement by cardiovascular surgery.    Assessment & Plan    Principal Problem:   Septic arthritis of sternoclavicular joint, left (HCC) -Status post I&D and drainage with wound VAC placement by CT VS -Wound cultures grew Klebsiella pneumonia -ID recommended IV Ancef while inpatient.  Can use oral Bactrim DS 2 DS tabs twice daily for 1 month at DC. -Wound VAC discontinued.  Appreciate CT surgery recommendations.  Wet-to-dry dressings daily, wife needs teaching.  -Will ask for [facility] assistance for [facility] RN  Active Problems:   Bacteremia due to Klebsiella pneumoniae -ID recommendations as above, continue IV Ancef while inpatient    Uncontrolled type 2 diabetes mellitus with hyperglycemia, without long-term current use of insulin (HCC) -Hemoglobin A1c 13.1 -During previous admission, patient was discharged on Semglee 40 units daily and metformin  Recent Labs    11/14/21 0738 11/14/21 1138 11/14/21 1621 11/14/21 2114  11/15/21 0808 11/15/21 1116  GLUCAP 133* 101* 139* 146* 107* 116*   -Continue current regimen    Essential hypertension -Continue lisinopril 5 mg daily  Hyperlipidemia Continue Lipitor  Estimated body mass index is 26.22 kg/m as calculated from the following:   Height as of this encounter: 5\' 3"  (1.6 m).   Weight as of this encounter: 67.1 kg.  Code Status: Full code DVT Prophylaxis:  SCDs Start: 11/05/21 0313   Level of Care: Level of care: Med-Surg Family Communication: Updated patient's wife at the bedside today  Disposition Plan:      Remains inpatient appropriate: DC home likely on Monday.  Wife needs instruction on daily dressing changes  Procedures:  -Status post I&D and drainage with wound VAC placement by CT VS  Consultants:   Infectious disease CT VS  Antimicrobials:   Anti-infectives (From admission, onward)    Start     Dose/Rate Route Frequency Ordered Stop   11/10/21 0000  sulfamethoxazole-trimethoprim (BACTRIM DS) 800-160 MG tablet        2 tablet Oral 2 times daily 11/10/21 1359 12/10/21 2359   11/06/21 1710  vancomycin (VANCOCIN) 1,000 mg in sodium chloride  0.9 % 1,000 mL irrigation  Status:  Discontinued          As needed 11/06/21 1712 11/06/21 1737   11/06/21 1200  cefOXitin (MEFOXIN) 2 g in sodium chloride 0.9 % 100 mL IVPB        2 g 200 mL/hr over 30 Minutes Intravenous 60 min pre-op 11/05/21 1622 11/06/21 1821   11/05/21 0900  metroNIDAZOLE (FLAGYL) tablet 500 mg  Status:  Discontinued        500 mg Oral Every 12 hours 11/05/21 0323 11/10/21 0929   11/05/21 0600  metroNIDAZOLE (FLAGYL) tablet 500 mg  Status:  Discontinued        500 mg Oral Every 8 hours 11/05/21 0312 11/05/21 0323   11/05/21 0500  ceFAZolin (ANCEF) IVPB 2g/100 mL premix        2 g 200 mL/hr over 30 Minutes Intravenous Every 8 hours 11/05/21 0326     11/05/21 0315  ceFAZolin (ANCEF) IVPB 2g/100 mL premix  Status:  Discontinued        2 g 200 mL/hr over 30 Minutes  Intravenous  Once 11/05/21 0312 11/05/21 0326   11/04/21 2000  ceFAZolin (ANCEF) IVPB 2g/100 mL premix        2 g 200 mL/hr over 30 Minutes Intravenous  Once 11/04/21 1949 11/04/21 2153   11/04/21 1945  metroNIDAZOLE (FLAGYL) tablet 500 mg  Status:  Discontinued        500 mg Oral Every 12 hours 11/04/21 1930 11/05/21 0312          Medications  atorvastatin  20 mg Oral Daily   insulin aspart  0-15 Units Subcutaneous TID WC   insulin aspart  0-5 Units Subcutaneous QHS   insulin glargine-yfgn  40 Units Subcutaneous Daily   lisinopril  5 mg Oral Daily   metFORMIN  500 mg Oral BID WC      Subjective:   Benjamin Thompson was seen and examined today am.  No acute complaints, pain controlled.  Wound VAC discontinued.     Objective:   Vitals:   11/14/21 1431 11/14/21 2111 11/15/21 0517 11/15/21 0851  BP: (!) 137/93 (!) 150/95 (!) 146/95 (!) 161/98  Pulse:  84 82   Resp: 15 18 17    Temp: 98.5 F (36.9 C) 98.5 F (36.9 C) 98.9 F (37.2 C)   TempSrc: Oral Oral Oral   SpO2:  96% 95%   Weight:      Height:        Intake/Output Summary (Last 24 hours) at 11/15/2021 1346 Last data filed at 11/15/2021 0854 Gross per 24 hour  Intake 236 ml  Output 2150 ml  Net -1914 ml     Wt Readings from Last 3 Encounters:  11/04/21 67.1 kg  11/02/21 67.1 kg  10/15/21 70.1 kg   Physical Exam General: Alert and oriented x 3, NAD Cardiovascular: S1 S2 clear, RRR. No pedal edema b/l Respiratory: CTAB, no wheezing, rales or rhonchi.  Dressing intact on the left chest wall Gastrointestinal: Soft, nontender, nondistended, NBS Ext: no pedal edema bilaterally Neuro: no new deficits Psych: Normal affect and demeanor, alert and oriented x3      Data Reviewed:  I have personally reviewed following labs    CBC Lab Results  Component Value Date   WBC 6.5 11/14/2021   RBC 4.43 11/14/2021   HGB 12.7 (L) 11/14/2021   HCT 38.6 (L) 11/14/2021   MCV 87.1 11/14/2021   MCH 28.7 11/14/2021    PLT 271 11/14/2021  MCHC 32.9 11/14/2021   RDW 13.2 11/14/2021   LYMPHSABS 1.9 11/14/2021   MONOABS 0.6 11/14/2021   EOSABS 0.2 11/14/2021   BASOSABS 0.1 11/14/2021     Last metabolic panel Lab Results  Component Value Date   NA 141 11/13/2021   K 3.5 11/13/2021   CL 104 11/13/2021   CO2 28 11/13/2021   BUN 14 11/13/2021   CREATININE 0.84 11/13/2021   GLUCOSE 77 11/13/2021   GFRNONAA >60 11/13/2021   CALCIUM 9.2 11/13/2021   PROT 7.3 11/05/2021   ALBUMIN 3.1 (L) 11/05/2021   BILITOT 0.5 11/05/2021   ALKPHOS 59 11/05/2021   AST 20 11/05/2021   ALT 21 11/05/2021   ANIONGAP 9 11/13/2021    CBG (last 3)  Recent Labs    11/14/21 2114 11/15/21 0808 11/15/21 1116  GLUCAP 146* 107* 116*         Nannette Zill M.D. [facility] 11/15/2021, 1:46 PM  Available via Epic secure chat 7am-7pm After 7 pm, please refer to night coverage provider listed on amion.

## 2021-11-15 NOTE — Plan of Care (Signed)
  Problem: Education: Goal: Knowledge of General Education information will improve Description: Including pain rating scale, medication(s)/side effects and non-pharmacologic comfort measures Outcome: Progressing   Problem: Health Behavior/Discharge Planning: Goal: Ability to manage health-related needs will improve Outcome: Progressing   Problem: Clinical Measurements: Goal: Diagnostic test results will improve Outcome: Progressing   Problem: Clinical Measurements: Goal: Respiratory complications will improve Outcome: Completed/Met Goal: Cardiovascular complication will be avoided Outcome: Completed/Met   Problem: Activity: Goal: Risk for activity intolerance will decrease Outcome: Completed/Met   Problem: Nutrition: Goal: Adequate nutrition will be maintained Outcome: Completed/Met   Problem: Coping: Goal: Level of anxiety will decrease Outcome: Completed/Met   Problem: Elimination: Goal: Will not experience complications related to bowel motility Outcome: Completed/Met Goal: Will not experience complications related to urinary retention Outcome: Completed/Met   Problem: Pain Managment: Goal: General experience of comfort will improve Outcome: Completed/Met   Problem: Skin Integrity: Goal: Risk for impaired skin integrity will decrease Outcome: Completed/Met   Problem: Coping: Goal: Ability to adjust to condition or change in health will improve Outcome: Completed/Met   Problem: Nutritional: Goal: Maintenance of adequate nutrition will improve Outcome: Completed/Met Goal: Progress toward achieving an optimal weight will improve Outcome: Completed/Met   Problem: Tissue Perfusion: Goal: Adequacy of tissue perfusion will improve Outcome: Completed/Met   Problem: Fluid Volume: Goal: Hemodynamic stability will improve Outcome: Completed/Met   Problem: Clinical Measurements: Goal: Diagnostic test results will improve Outcome: Completed/Met Goal: Signs and  symptoms of infection will decrease Outcome: Completed/Met   Problem: Respiratory: Goal: Ability to maintain adequate ventilation will improve Outcome: Completed/Met

## 2021-11-15 NOTE — Progress Notes (Signed)
Daily dressing changed. Patient's wife at bedside, involved in dressing change. Instructions given to patient and wife via Nurse, learning disability.

## 2021-11-15 NOTE — Consult Note (Signed)
WOC Nurse Consult Note: NPWT to sternoclavicular wound discontinued by Dr. Wayland Salinas due to frequent leaking of dressing and inability to maintain seal. NS dressings ordered by that provide and instructions for bedside nursing to begin teaching of patient's wife in anticipation of discharge. No role for WOC Nursing at this time.  WOC nursing team will not follow, but will remain available to this patient, the nursing and medical teams.  Please re-consult if needed.  Thank you for inviting [place] to participate in this patient's Plan of Care.  Ladona Mow, MSN, RN, CNS, GNP, Leda Min, [employer], [employer] phone:  [phone]

## 2021-11-15 NOTE — Progress Notes (Addendum)
      [address]       [place] [address]             [phone]        9 Days Post-Op Procedure(s) (LRB): IRRIGATION AND DEBRIDEMENT STERNOCLAVICULAR JOINT ABSCESS (Left) APPLICATION OF WOUND VAC (Left)  Subjective: Patient sleeping and awakened.  Objective: Vital signs in last 24 hours: Temp:  [98.5 F (36.9 C)-98.9 F (37.2 C)] 98.9 F (37.2 C) (07/02 0517) Pulse Rate:  [82-84] 82 (07/02 0517) Cardiac Rhythm: Normal sinus rhythm (07/01 1903) Resp:  [15-18] 17 (07/02 0517) BP: (137-162)/(93-96) 146/95 (07/02 0517) SpO2:  [95 %-96 %] 95 % (07/02 0517)   Current Weight  11/04/21 67.1 kg      Intake/Output from previous day: 07/01 0701 - 07/02 0700 In: 1616 [P.O.:236; I.V.:1068.6; IV Piggyback:311.4] Out: 3000 [Urine:3000]   Physical Exam:  Cardiovascular: RRR Wounds: Dressing clean, dry.  Lab Results: CBC: Recent Labs    11/14/21 0721  WBC 6.5  HGB 12.7*  HCT 38.6*  PLT 271    BMET:  Recent Labs    11/13/21 0016  NA 141  K 3.5  CL 104  CO2 28  GLUCOSE 77  BUN 14  CREATININE 0.84  CALCIUM 9.2     PT/INR:  Lab Results  Component Value Date   INR 1.1 11/05/2021   INR 1.1 11/05/2021   INR 1.0 10/19/2021   ABG:  INR: Will add last result for INR, ABG once components are confirmed Will add last 4 CBG results once components are confirmed  Assessment/Plan:  1. CV - SR. On Lisinopril 5 mg daily. 2.  Pulmonary - On room air. 3. ID-Remains afebrile. On Cefazolin or septic arthritis/liver abscess. Gram stain of left SCV joint shows no organisms and culture shows rare Klebsiella Pneumoniae 4. History of DM-CBGs 142/135/133. On Metformin 500 mg bid and Insulin. Per medicine. 5. Continue daily dressing changes. Nurse instructed to administer pain medication prior to changing dressing this am. 6. Regarding disposition, likely home in am. The patient's wife needs to be instructed on daily wet-to-dry dressing  changes.  Donielle M ZimmermanPA-C 7:55 AM  Agree with above. Continue wet to dry dressing changes.

## 2021-11-16 ENCOUNTER — Inpatient Hospital Stay: Payer: Self-pay | Admitting: Family Medicine

## 2021-11-16 ENCOUNTER — Other Ambulatory Visit (HOSPITAL_COMMUNITY): Payer: Self-pay

## 2021-11-16 LAB — CBC WITH DIFFERENTIAL/PLATELET
Abs Immature Granulocytes: 0.03 10*3/uL (ref 0.00–0.07)
Basophils Absolute: 0.1 10*3/uL (ref 0.0–0.1)
Basophils Relative: 1 %
Eosinophils Absolute: 0.2 10*3/uL (ref 0.0–0.5)
Eosinophils Relative: 3 %
HCT: 39.9 % (ref 39.0–52.0)
Hemoglobin: 13.2 g/dL (ref 13.0–17.0)
Immature Granulocytes: 1 %
Lymphocytes Relative: 29 %
Lymphs Abs: 1.9 10*3/uL (ref 0.7–4.0)
MCH: 28.5 pg (ref 26.0–34.0)
MCHC: 33.1 g/dL (ref 30.0–36.0)
MCV: 86.2 fL (ref 80.0–100.0)
Monocytes Absolute: 0.6 10*3/uL (ref 0.1–1.0)
Monocytes Relative: 9 %
Neutro Abs: 3.8 10*3/uL (ref 1.7–7.7)
Neutrophils Relative %: 57 %
Platelets: 274 10*3/uL (ref 150–400)
RBC: 4.63 MIL/uL (ref 4.22–5.81)
RDW: 13.3 % (ref 11.5–15.5)
WBC: 6.5 10*3/uL (ref 4.0–10.5)
nRBC: 0 % (ref 0.0–0.2)

## 2021-11-16 LAB — GLUCOSE, CAPILLARY
Glucose-Capillary: 146 mg/dL — ABNORMAL HIGH (ref 70–99)
Glucose-Capillary: 123 mg/dL — ABNORMAL HIGH (ref 70–99)

## 2021-11-16 MED ORDER — HYDROCODONE-ACETAMINOPHEN 5-325 MG PO TABS
1.0000 | ORAL_TABLET | Freq: Four times a day (QID) | ORAL | 0 refills | Status: DC | PRN
Start: 2021-11-16 — End: 2021-11-27
  Filled 2021-11-16: qty 30, 4d supply, fill #0

## 2021-11-16 MED ORDER — LISINOPRIL 5 MG PO TABS
5.0000 mg | ORAL_TABLET | Freq: Every day | ORAL | 3 refills | Status: DC
  Filled 2021-11-16: qty 30, 30d supply, fill #0

## 2021-11-16 MED ORDER — METHOCARBAMOL 500 MG PO TABS
500.0000 mg | ORAL_TABLET | Freq: Four times a day (QID) | ORAL | 0 refills | Status: DC | PRN
  Filled 2021-11-16: qty 20, 5d supply, fill #0

## 2021-11-16 NOTE — Progress Notes (Addendum)
      [address]       [place] [address]             [phone]        10 Days Post-Op Procedure(s) (LRB): IRRIGATION AND DEBRIDEMENT STERNOCLAVICULAR JOINT ABSCESS (Left) APPLICATION OF WOUND VAC (Left)  Subjective: Patient sleeping and awakened. He has some pain at wound site.  Objective: Vital signs in last 24 hours: Temp:  [98.1 F (36.7 C)-98.9 F (37.2 C)] 98.1 F (36.7 C) (07/03 0609) Pulse Rate:  [77-82] 77 (07/03 0609) Cardiac Rhythm: Normal sinus rhythm (07/02 2150) Resp:  [15-19] 15 (07/03 0641) BP: (125-161)/(10-131) 151/90 (07/03 0641) SpO2:  [93 %-97 %] 94 % (07/03 0609)   Current Weight  11/04/21 67.1 kg      Intake/Output from previous day: 07/02 0701 - 07/03 0700 In: 220 [P.O.:220] Out: 850 [Urine:850]   Physical Exam:  Cardiovascular: RRR Wounds: Dressing partially removed this am. There is purulence on dressing and fringe of wound. Granulation tissue present  Lab Results: CBC: Recent Labs    11/14/21 0721 11/16/21 0544  WBC 6.5 6.5  HGB 12.7* 13.2  HCT 38.6* 39.9  PLT 271 274    BMET:  No results for input(s): "NA", "K", "CL", "CO2", "GLUCOSE", "BUN", "CREATININE", "CALCIUM" in the last 72 hours.   PT/INR:  Lab Results  Component Value Date   INR 1.1 11/05/2021   INR 1.1 11/05/2021   INR 1.0 10/19/2021   ABG:  INR: Will add last result for INR, ABG once components are confirmed Will add last 4 CBG results once components are confirmed  Assessment/Plan:  1. CV - SR. On Lisinopril 5 mg daily. 2.  Pulmonary - On room air. 3. ID-Remains afebrile. On Cefazolin or septic arthritis/liver abscess. Gram stain of left SCV joint shows no organisms and culture shows rare Klebsiella Pneumoniae 4. History of DM-CBGs 116/184/157. On Metformin 500 mg bid and Insulin. Per medicine. 5. Continue daily dressing changes. Nurse instructed to administer pain medication prior to changing dressing this am. 6.  The  patient's wife needs to be instructed on daily wet-to-dry dressing changes. Dr. Donata Clay to evaluate wound this am.   Lelon Huh [facility] 7:16 AM  patient examined and medical record reviewed,agree with above note. I examined the chest wound, applied a new dressing, and instructed the wife at bedside how to do daily wound care. Chest wound now appropriate for out patient management-will contact patient for office visit. Plan daily wet/dry dressing change and po Cipro 750 mg po bid for 4 weeks as rec by Dr Luciana Axe. Lovett Sox 11/16/2021

## 2021-11-16 NOTE — TOC Transition Note (Signed)
Transition of Care [facility]) - CM/SW Discharge Note   Patient Details  Name: Benjamin Thompson MRN: [id number] Date of Birth: 1971/01/22  [facility] CM/SW Contact:  Gala Lewandowsky, RN Phone Number: 11/16/2021, 1:25 PM   Clinical Narrative:  Patient will return home today. Patient no longer has wound vac and the spouse has been educated to do the wound care- wet-to-dry dressings. Case Manager spoke with the spouse, patient, and the interpreter and the spouse states she will be able to complete the dressing changes without home health care. Case Manager had reached out to [facility] for charity care and they could not provide the services. [facility] was willing to assist and wife declined services. Medications have been delivered via [facility]. Patient had an appointment at the [facility] for today- Case Manager had to cancel and was trying to reschedule- did not hear back from the clinic. Case Manager called the [facility] and they can see the patient next week. Information is on the AVS. No further needs identified at this time.     Barriers to Discharge: No Barriers Identified   Patient Goals and CMS Choice Patient states their goals for this hospitalization and ongoing recovery are:: plan to return home- has not worked in 2 months.     Discharge Plan and Services In-house Referral: PCP / [facility], Interpreting Services Discharge Planning Services: CM Consult, [facility] Post Acute Care Choice: Home Health          DME Arranged: Negative pressure wound device DME Agency: [employer] Date DME Agency Contacted: 11/12/21 Time DME Agency Contacted: 1200 Representative spoke with at DME Agency: Dawn- charity form being filled out. Needs order to be filled out. HH Arranged: RN     Readmission Risk Interventions    10/20/2021   12:02 PM  Readmission Risk Prevention Plan  Post Dischage Appt Not Complete  Appt Comments f/u with [facility] 7/3  Medication  Screening Complete  Transportation Screening Complete

## 2021-11-16 NOTE — Progress Notes (Signed)
Pt and wife given discharge information with interpretor Gracella with understanding. Emelda Brothers RN

## 2021-11-16 NOTE — Discharge Summary (Signed)
Physician Discharge Summary   Patient: Benjamin Thompson MRN: [id number] DOB: 01-Oct-1970  Admit date:     11/04/2021  Discharge date: 11/16/21  Discharge Physician: Estill Cotta, MD    PCP: Patient, No Pcp Per   Recommendations at discharge:   Continue Bactrim DS 2 tabs twice daily for 4 weeks  Discharge Diagnoses:    Septic arthritis of sternoclavicular joint, left ([place])   Bacteremia due to Klebsiella pneumoniae   Uncontrolled type 2 diabetes mellitus with hyperglycemia, without long-term current use of insulin [facility])   Essential hypertension   Hospital Course:  Benjamin Thompson is a 51 y.o. male with PMH significant for DM2, HTN. Patient was recently hospitalized 5/31-6/6 for liver abscess that was drained, found to have Klebsiella pneumonia bacteremia and was discharged on oral cefadroxil and Flagyl for 4 weeks to complete on 7/2. Patient presented to the ED on 6/21 with 3 weeks of left shoulder pain, swelling. Findings suggestive of of septic arthritis of left sternoclavicular joint.  ID, cardiovascular surgery consulted on admission.   Started on antibiotics.   6/23, patient underwent surgical debridement of infected left sternoclavicular joint and wound VAC placement by cardiovascular surgery.    Assessment and Plan:    Septic arthritis of sternoclavicular joint, left (HCC) -Status post I&D and drainage with wound VAC placement by CT VS -Wound cultures grew Klebsiella pneumonia -Wound VAC now discontinued, daily wet-to-dry dressings as recommended by CT surgery.  Wife was also educated about dressing changes. -Continue pain control with Percocet as needed -ID recommended IV Ancef while inpatient and transition to oral Bactrim DS 2 tabs twice daily for 4 weeks.  Confirmed with Dr. Juleen China, [place] on-call prior to DC.     Bacteremia due to Klebsiella pneumoniae -ID recommendations as above, continue IV Ancef while inpatient -Per ID recommendations, transition to oral Bactrim DS  2 tabs twice daily for 4 weeks, prescription was sent to [facility]     Uncontrolled type 2 diabetes mellitus with hyperglycemia, without long-term current use of insulin (HCC) -Hemoglobin A1c 13.1 -During previous admission, patient was discharged on Semglee 40 units daily and metformin, continue      Essential hypertension -Continue lisinopril 5 mg daily   Hyperlipidemia Continue Lipitor        Pain control - [place] Controlled Substance Reporting System database was reviewed. and patient was instructed, not to drive, operate heavy machinery, perform activities at heights, swimming or participation in water activities or provide baby-sitting services while on Pain, Sleep and Anxiety Medications; until their outpatient Physician has advised to do so again. Also recommended to not to take more than prescribed Pain, Sleep and Anxiety Medications.  Consultants: Infectious disease, cardiothoracic surgery Procedures performed: I&D and drainage VAC placement Disposition: Home Diet recommendation:  Discharge Diet Orders (From admission, onward)      Carb modified diet DISCHARGE MEDICATION: Allergies as of 11/16/2021   No Known Allergies      Medication List     STOP taking these medications    amLODipine 5 MG tablet Commonly known as: NORVASC   cefadroxil 500 MG capsule Commonly known as: DURICEF   metroNIDAZOLE 500 MG tablet Commonly known as: FLAGYL       TAKE these medications    Accu-Chek Guide test strip Generic drug: glucose blood Use como se indica. (Use As Directed)   Accu-Chek Guide w/Device Kit Use como se indica. (Use As Directed)   Accu-Chek Softclix Lancets lancets Use como se indica. (Use As Directed)  acetaminophen 500 MG tablet Commonly known as: TYLENOL Take 500 mg by mouth every 6 (six) hours as needed for moderate pain.   atorvastatin 20 MG tablet Commonly known as: Lipitor Tome 1 tableta (20 mg en total) por va  oral diariamente. (Take 1 tablet (20 mg total) by mouth daily.)   diclofenac Sodium 1 % Gel Commonly known as: VOLTAREN Apply 4 g topically 4 (four) times daily. What changed: when to take this   HYDROcodone-acetaminophen 5-325 MG tablet Commonly known as: NORCO/VICODIN Take 1-2 tablets by mouth every 6 (six) hours as needed for moderate pain.   insulin glargine-yfgn 100 UNIT/ML Pen Commonly known as: SEMGLEE Inject 40 Units into the skin daily.   lisinopril 5 MG tablet Commonly known as: ZESTRIL Tome 1 tableta (5 mg en total) por va oral diariamente. (Take 1 tablet (5 mg total) by mouth daily.) Start taking on: November 17, 2021   metFORMIN 500 MG tablet Commonly known as: GLUCOPHAGE Take 1 tablet (500 mg total) by mouth 2 (two) times daily with a meal.   methocarbamol 500 MG tablet Commonly known as: ROBAXIN Take 1 tablet (500 mg total) by mouth every 6 (six) hours as needed for muscle spasms.   Pentips 32G X 4 MM Misc Generic drug: Insulin Pen Needle Use como se indica. (Use As Directed)   sulfamethoxazole-trimethoprim 800-160 MG tablet Commonly known as: BACTRIM DS Take 2 tablets by mouth 2 (two) times daily.               Discharge Care Instructions  (From admission, onward)           Start     Ordered   11/16/21 0000  Discharge wound care:       Comments: Wet-to-dry dressing changes daily   11/16/21 1110            Follow-up Information     Comer, Okey Regal, MD. Schedule an appointment as soon as possible for a visit in 2 week(s).   Specialty: Infectious Diseases Why: for hospital follow-up Contact information: [address] [address] [place]. Schedule an appointment as soon as possible for a visit in 2 week(s).   Why: for hospital follow-up Contact information: [place] [phone]                Discharge Exam: Danley Danker Weights   11/04/21 1557  Weight: 67.1 kg   S: No acute complaints, looking forward to discharge today.  Pain is controlled.  No fevers or chills.  Vitals:   11/15/21 2120 11/16/21 0609 11/16/21 0641 11/16/21 0807  BP: (!) 158/131 (!) 125/10 (!) 151/90 (!) 148/96  Pulse: 82 77  80  Resp: 18 18 15 15   Temp: 98.9 F (37.2 C) 98.1 F (36.7 C)  98.1 F (36.7 C)  TempSrc: Oral Oral  Oral  SpO2: 93% 94%  98%  Weight:      Height:         Physical Exam General: Alert and oriented x 3, NAD Cardiovascular: S1 S2 clear, RRR. No pedal edema b/l Respiratory: CTAB, no wheezing, rales or rhonchi.  Dressing + left upper chest wall Gastrointestinal: Soft, nontender, nondistended, NBS Ext: no pedal edema bilaterally Neuro: no new deficits Psych: Normal affect and demeanor, alert and oriented x3    Condition at discharge: fair  The results of significant diagnostics  from this hospitalization (including imaging, microbiology, ancillary and laboratory) are listed below for reference.   Imaging Studies: CT SOFT TISSUE NECK W CONTRAST  Result Date: 11/05/2021 CLINICAL DATA:  Bacteremia, neck pain with sternoclavicular osteomyelitis EXAM: CT NECK WITH CONTRAST TECHNIQUE: Multidetector CT imaging of the neck was performed using the standard protocol following the bolus administration of intravenous contrast. RADIATION DOSE REDUCTION: This exam was performed according to the departmental dose-optimization program which includes automated exposure control, adjustment of the mA and/or kV according to patient size and/or use of iterative reconstruction technique. CONTRAST:  58m OMNIPAQUE IOHEXOL 300 MG/ML  SOLN COMPARISON:  CT chest 1 day prior FINDINGS: Pharynx and larynx: The nasal cavity and nasopharynx are unremarkable. The oral cavity and oropharynx are unremarkable. The parapharyngeal spaces are clear. The hypopharynx and larynx are unremarkable. The vocal folds are  normal. There is no abnormal enhancement, fluid collection, or soft tissue lesion. Salivary glands: The parotid and submandibular glands are unremarkable. Thyroid: Unremarkable. Lymph nodes: There is no pathologic lymphadenopathy in the neck. Vascular: There is mild calcified plaque at the carotid bifurcations. The bilateral internal jugular veins are patent. Limited intracranial: The imaged portions of the intracranial compartment are unremarkable. Visualized orbits: The globes and orbits are unremarkable. Mastoids and visualized paranasal sinuses: The paranasal sinuses and mastoid air cells are clear. Skeleton: The cervical spine is unremarkable. There is lytic change about the left sternoclavicular joint with surrounding inflammatory change and soft tissue thickening. There is a 1.2 cm by 1.0 cm peripherally enhancing fluid collection along the anterior aspect of the distal left clavicle suspicious for an abscess. There is an additional peripherally enhancing focus of hypodensity along the inner margin of the upper sternal manubrium in the upper mediastinum measuring 1.5 cm by 0.7 cm in the axial plane, also suspicious for abscess (2-135). Both of these abscesses appear contiguous with the joint space in the coronal plane (6-51, 6-42, 7-91). There is mild fat stranding in the left supraclavicular fossa. Upper chest: There is patchy and linear opacity in the lung apices, similar to the CT chest from 1 day prior. Other: None. IMPRESSION: 1. 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 There are small abscesses both along the anterosuperior margin of the distal clavicle and along the posterior surface of the upper sternal manubrium in the upper mediastinum common both of which appear contiguous with sternoclavicular joint space. 2. No acute pathology in the neck. Electronically Signed   By: PValetta MoleM.D.   On:  11/05/2021 13:21   CT Chest W Contrast  Result Date: 11/04/2021 CLINICAL DATA:  Pain and swelling left chest wall. EXAM: CT CHEST WITH CONTRAST TECHNIQUE: Multidetector CT imaging of the chest was performed during intravenous contrast administration. RADIATION DOSE REDUCTION: This exam was performed according to the departmental dose-optimization program which includes automated exposure control, adjustment of the mA and/or kV according to patient size and/or use of iterative reconstruction technique. CONTRAST:  719mOMNIPAQUE IOHEXOL 300 MG/ML  SOLN COMPARISON:  10/15/2021 FINDINGS: Cardiovascular: There is homogeneous enhancement in thoracic aorta. There are no intraluminal filling defects in the pulmonary artery branches. Mediastinum/Nodes: No significant lymphadenopathy seen in mediastinum. There is soft tissue swelling around the left sternoclavicular joint. There are lytic lesions in the medial end of left clavicle and sternum adjacent to the left sternoclavicular joint. There is no loculated fluid collection. There are no pockets of air in the soft tissues. Lungs/Pleura: There are small linear  patchy densities in both parahilar regions and lower lung fields with interval improvement. There are no new focal infiltrates. There is no pleural effusion or pneumothorax. Upper Abdomen: Gallbladder stones are seen. There is no dilation of bile ducts. There is 2.8 cm cyst in the medial upper pole of left kidney. Musculoskeletal: Lytic lesions are seen in the medial end of left clavicle and sternum adjacent to the left sternoclavicular joint. There is soft tissue swelling around the left sternoclavicular joint without definite loculated fluid collection. IMPRESSION: There is new lytic lesion in the medial end of left clavicle and manubrium sternum adjacent to the left sternoclavicular joint. Findings suggest possible septic arthritis. There is soft tissue swelling around the left sternoclavicular joint without  demonstrable drainable abscess. There is no evidence of pulmonary artery embolism. There is no evidence of thoracic aortic dissection. There is interval partial clearing of linear and ground-glass infiltrates in both lungs suggesting resolving pneumonia. No new infiltrates are seen. There is no pleural effusion. Gallbladder stones.  Left renal cyst. Imaging finding of new septic arthritis in the left sternoclavicular joint was relayed to Dr. Regenia Skeeter by telephone call. Electronically Signed   By: Elmer Picker M.D.   On: 11/04/2021 18:01   MR SHOULDER LEFT W WO CONTRAST  Result Date: 10/20/2021 CLINICAL DATA:  Left shoulder pain, bacteremia EXAM: MRI OF THE LEFT SHOULDER WITHOUT AND WITH CONTRAST TECHNIQUE: Multiplanar, multisequence MR imaging of the left shoulder was performed before and after the administration of intravenous contrast. CONTRAST:  72m GADAVIST GADOBUTROL 1 MMOL/ML IV SOLN COMPARISON:  X-ray 10/16/2021 FINDINGS: Rotator cuff: Mild tendinosis of the supraspinatus, infraspinatus, and subscapularis tendons without tear. Teres minor intact. Muscles: Preserved bulk and signal intensity of the rotator cuff musculature without edema, atrophy, or fatty infiltration. Biceps long head: Intra-articular biceps tendinosis. No tenosynovitis. Acromioclavicular Joint: No significant degenerative changes of the AC joint. Mild subacromial-subdeltoid bursal edema without significant fluid accumulation. Glenohumeral Joint: Physiologic amount of joint fluid without effusion. No enhancing synovitis. No cartilage defect. Labrum:  No evidence of labral tear. No paralabral cyst. Bones: No acute fracture. No dislocation. No bone marrow edema. No erosion. No periostitis no marrow replacing bone lesion. Other: Mild soft tissue edema seen within the supraclavicular region at the edge of the field of view, nonspecific. No fluid collections are seen. IMPRESSION: 1. No evidence of septic arthritis or osteomyelitis  involving the left shoulder. 2. Mild soft tissue edema within the supraclavicular region at the edge of the field of view, nonspecific. No fluid collections are seen. 3. Mild rotator cuff tendinosis without tear. 4. Intra-articular biceps tendinosis without tenosynovitis. Electronically Signed   By: NDavina PokeD.O.   On: 10/20/2021 08:32   CT ASPIRATION  Result Date: 10/19/2021 CLINICAL DATA:  Gram-negative bacteremia, enhancing 2.3 cm right hepatic lesion on CT, suspicious for abscess on MRI. EXAM: CT GUIDED ASPIRATION OF RIGHT LIVER LESION ANESTHESIA/SEDATION: Intravenous Fentanyl 575m and Versed 51m85mere administered as conscious sedation during continuous monitoring of the patient's level of consciousness and physiological / cardiorespiratory status by the radiology RN, with a total moderate sedation time of 10 minutes. PROCEDURE: The procedure risks, benefits, and alternatives were explained to the patient. Questions regarding the procedure were encouraged and answered. The patient understands and consents to the procedure. Select axial scans through the liver were obtained. The lesion was localized and an appropriate skin entry site was determined and marked. The operative field was prepped with chlorhexidinein a sterile fashion, and a sterile drape  was applied covering the operative field. A sterile gown and sterile gloves were used for the procedure. Local anesthesia was provided with 1% Lidocaine. Under CT fluoroscopic guidance, 18 gauge trocar needle advanced to the collection. 2 mL purulent aspirate were removed, sent for Gram stain and culture. Postprocedure scans show decrease in size of the collection. Negative for hemorrhage or other apparent complication. The patient tolerated the procedure well. RADIATION DOSE REDUCTION: This exam was performed according to the departmental dose-optimization program which includes automated exposure control, adjustment of the mA and/or kV according to  patient size and/or use of iterative reconstruction technique. COMPLICATIONS: None immediate FINDINGS: Low-attenuation hepatic segment 7 lesion was localized corresponding to MR findings. Aspiration returned 2 mL purulent material. No evidence of postprocedure hemorrhage or other complication. IMPRESSION: 1. Technically successful CT-guided aspiration, right hepatic lesion, sent for Gram stain and culture. Electronically Signed   By: Lucrezia Europe M.D.   On: 10/19/2021 12:13   MR LIVER W WO CONTRAST  Result Date: 10/18/2021 CLINICAL DATA:  51 year old male with history of bacteremia. Suspected liver abscess. EXAM: MRI ABDOMEN WITHOUT AND WITH CONTRAST TECHNIQUE: Multiplanar multisequence MR imaging of the abdomen was performed both before and after the administration of intravenous contrast. CONTRAST:  7m GADAVIST GADOBUTROL 1 MMOL/ML IV SOLN COMPARISON:  No prior abdominal MRI. CT the abdomen and pelvis 10/17/2021. Abdominal ultrasound 10/16/2021. FINDINGS: Lower chest: Small bilateral pleural effusions lying dependently. Hepatobiliary: Diffuse loss of signal intensity throughout the hepatic parenchyma on out of phase dual echo images, indicative of a background of hepatic steatosis. In segment 7 of the liver (axial image 42 of series 20 and coronal image 29 of series 24) there is a 2.3 x 2.0 x 2.0 cm lesion which is centrally mildly T1 hypointense, T2 hyperintense, with some ill-defined surrounding T2 hyperintensity and peripheral rim of enhancement on post gadolinium imaging. This lesion mildly restricts diffusion as well. No other suspicious hepatic lesions are noted. No intra or extrahepatic biliary ductal dilatation. Multiple small filling defects are noted within the lumen of the gallbladder, compatible with gallstones. Gallbladder is only mildly distended. Gallbladder wall thickness is normal. No pericholecystic fluid, although there is a small amount of fluid in the region of the porta hepatis, tracking  caudally adjacent to the second portion of the duodenum. Common bile duct measures 4 mm in the porta hepatis. Pancreas: No pancreatic mass. No pancreatic ductal dilatation. No pancreatic or peripancreatic fluid collections or inflammatory changes. Spleen:  Unremarkable. Adrenals/Urinary Tract: In the medial aspect of the upper pole of the left kidney there is a 2.7 cm T1 hypointense, T2 hyperintense lesion, compatible with a simple (Bosniak class 1) cysts. Several other simple appearing subcentimeter cysts are also noted elsewhere in the kidneys bilaterally (no imaging follow-up is recommended). No aggressive appearing renal lesions. No hydroureteronephrosis in the visualized portions of the abdomen. Bilateral adrenal glands are normal in appearance. Stomach/Bowel: Visualized portions are unremarkable. Vascular/Lymphatic: No aneurysm identified in the visualized abdominal vasculature. Circumaortic left renal vein (normal anatomical variant) incidentally noted. No lymphadenopathy noted in the abdomen. Other: No significant volume of ascites noted in the visualized portions of the peritoneal cavity. Musculoskeletal: No aggressive appearing osseous lesions are noted in the visualized portions of the skeleton. IMPRESSION: 1. 2.3 x 2.0 x 2.0 cm lesion in segment 7 of the liver has imaging characteristics compatible with an intrahepatic abscess. 2. Cholelithiasis. No definitive imaging findings to suggest acute cholecystitis are noted at this time. There is a trace volume  of fluid in the porta hepatis tracking caudally adjacent to the second portion of the duodenum. Given the absence of other overt findings of acute cholecystitis, this is favored to be reactive free fluid related to the hepatic abscess at this time. 3. Hepatic steatosis. 4. Small bilateral pleural effusions lying dependently. 5. Additional incidental findings, as above. Electronically Signed   By: Vinnie Langton M.D.   On: 10/18/2021 08:33     Microbiology: Results for orders placed or performed during the hospital encounter of 11/04/21  Culture, blood (routine x 2)     Status: None   Collection Time: 11/04/21  7:00 PM   Specimen: BLOOD  Result Value Ref Range Status   Specimen Description   Final    BLOOD LEFT ANTECUBITAL Performed at [facility] [address] [place], [place] [address]    Special Requests   Final    BOTTLES DRAWN AEROBIC AND ANAEROBIC Blood Culture adequate volume Performed at [facility] [address] [place], [place] [address]    Culture   Final    NO GROWTH 5 DAYS Performed at [facility], [place] [address], [place], [place] [address]    Report Status 11/09/2021 FINAL  Final  Culture, blood (routine x 2)     Status: None   Collection Time: 11/04/21  7:10 PM   Specimen: BLOOD  Result Value Ref Range Status   Specimen Description   Final    BLOOD RIGHT ANTECUBITAL Performed at [facility] [address], [place], [place] [address]    Special Requests   Final    BOTTLES DRAWN AEROBIC AND ANAEROBIC Blood Culture adequate volume Performed at [facility] [address], [place], [place] [address]    Culture   Final    NO GROWTH 5 DAYS Performed at [facility], [place] [address], [place], [place] [address]    Report Status 11/09/2021 FINAL  Final  Surgical pcr screen     Status: None   Collection Time: 11/05/21  3:44 PM   Specimen: Nasal Mucosa; Nasal Swab  Result Value Ref Range Status   MRSA, PCR NEGATIVE NEGATIVE Final   Staphylococcus aureus NEGATIVE NEGATIVE Final    Comment: (NOTE) The Xpert SA Assay (FDA approved for NASAL specimens in patients 54 years of age and older), is one component of a comprehensive surveillance program. It is not intended to diagnose infection nor to guide or monitor treatment. Performed at [facility], [place] [address], [place], [place]  [address]   Surgical pcr screen     Status: None   Collection Time: 11/06/21  5:22 AM   Specimen: Nasal Mucosa; Nasal Swab  Result Value Ref Range Status   MRSA, PCR NEGATIVE NEGATIVE Final   Staphylococcus aureus NEGATIVE NEGATIVE Final    Comment: (NOTE) The Xpert SA Assay (FDA approved for NASAL specimens in patients 88 years of age and older), is one component of a comprehensive surveillance program. It is not intended to diagnose infection nor to guide or monitor treatment. Performed at [facility], [place] [address] [place], [place] [address]   Aerobic/Anaerobic Culture w Gram Stain (surgical/deep wound)     Status: None   Collection Time: 11/06/21  4:05 PM   Specimen: Chest; Abscess  Result Value Ref Range Status   Specimen Description ABSCESS  Final   Special Requests CHEST WALL ABSCESS  Final   Gram Stain   Final  ABUNDANT WBC PRESENT,BOTH PMN AND MONONUCLEAR NO ORGANISMS SEEN    Culture   Final    RARE KLEBSIELLA PNEUMONIAE NO ANAEROBES ISOLATED Performed at [facility], [place] [address], [place], [place] [address]    Report Status 11/11/2021 FINAL  Final   Organism ID, Bacteria KLEBSIELLA PNEUMONIAE  Final      Susceptibility   Klebsiella pneumoniae - MIC*    AMPICILLIN >=32 RESISTANT Resistant     CEFAZOLIN <=4 SENSITIVE Sensitive     CEFEPIME <=0.12 SENSITIVE Sensitive     CEFTAZIDIME <=1 SENSITIVE Sensitive     CEFTRIAXONE <=0.25 SENSITIVE Sensitive     CIPROFLOXACIN <=0.25 SENSITIVE Sensitive     GENTAMICIN <=1 SENSITIVE Sensitive     IMIPENEM <=0.25 SENSITIVE Sensitive     TRIMETH/SULFA <=20 SENSITIVE Sensitive     AMPICILLIN/SULBACTAM 4 SENSITIVE Sensitive     PIP/TAZO <=4 SENSITIVE Sensitive     * RARE KLEBSIELLA PNEUMONIAE  Aerobic Culture w Gram Stain (superficial specimen)     Status: None   Collection Time: 11/06/21  4:05 PM   Specimen: Abscess  Result Value Ref Range Status   Specimen Description ABSCESS  Final   Special Requests LEFT  STERNOCLAVICULAR JOINT  Final   Gram Stain NO WBC SEEN NO ORGANISMS SEEN   Final   Culture   Final    RARE KLEBSIELLA PNEUMONIAE SUSCEPTIBILITIES PERFORMED ON PREVIOUS CULTURE WITHIN THE LAST 5 DAYS. Performed at [facility], [place] [address] [place], [place] [address]    Report Status 11/11/2021 FINAL  Final    Labs: CBC: Recent Labs  Lab 11/10/21 0458 11/12/21 0408 11/14/21 0721 11/16/21 0544  WBC 7.4 7.8 6.5 6.5  NEUTROABS 4.5 4.8 3.8 3.8  HGB 13.1 13.9 12.7* 13.2  HCT 40.7 43.4 38.6* 39.9  MCV 87.3 87.1 87.1 86.2  PLT 300 344 271 770   Basic Metabolic Panel: Recent Labs  Lab 11/10/21 0458 11/13/21 0016  NA 137 141  K 3.7 3.5  CL 105 104  CO2 25 28  GLUCOSE 107* 77  BUN 10 14  CREATININE 0.65 0.84  CALCIUM 8.5* 9.2   Liver Function Tests: No results for input(s): "AST", "ALT", "ALKPHOS", "BILITOT", "PROT", "ALBUMIN" in the last 168 hours. CBG: Recent Labs  Lab 11/15/21 1116 11/15/21 1638 11/15/21 2123 11/16/21 0807 11/16/21 1132  GLUCAP 116* 184* 157* 146* 123*    Discharge time spent: greater than 30 minutes.  Signed: Estill Cotta, MD [facility] 11/16/2021

## 2021-11-18 ENCOUNTER — Other Ambulatory Visit (HOSPITAL_COMMUNITY): Payer: Self-pay

## 2021-11-23 ENCOUNTER — Ambulatory Visit (INDEPENDENT_AMBULATORY_CARE_PROVIDER_SITE_OTHER): Payer: Self-pay | Admitting: Cardiothoracic Surgery

## 2021-11-23 ENCOUNTER — Encounter: Payer: Self-pay | Admitting: Cardiothoracic Surgery

## 2021-11-23 DIAGNOSIS — Z4889 Encounter for other specified surgical aftercare: Secondary | ICD-10-CM | POA: Insufficient documentation

## 2021-11-23 NOTE — Progress Notes (Signed)
HPI: Scheduled postop office visit follow-up after debridement of left sternoclavicular joint infection-sequela of Klebsiella pneumonia.  Patient states he is feeling better and the pain is less.  The patient's wife performs daily wound packing with saline wet-to-dry on 2 x 2 gauze.  Is been no fever or systemic symptoms other than intermittent nausea.  He is not requesting more pain medication.  Current Outpatient Medications  Medication Sig Dispense Refill   Accu-Chek Softclix Lancets lancets Use As Directed 100 each 0   acetaminophen (TYLENOL) 500 MG tablet Take 500 mg by mouth every 6 (six) hours as needed for moderate pain.     atorvastatin (LIPITOR) 20 MG tablet Take 1 tablet (20 mg total) by mouth daily. 30 tablet 0   Blood Glucose Monitoring Suppl (ACCU-CHEK GUIDE) w/Device KIT Use As Directed 1 kit 0   diclofenac Sodium (VOLTAREN) 1 % GEL Apply 4 g topically 4 (four) times daily. (Patient taking differently: Apply 4 g topically 2 (two) times daily.) 100 g 0   glucose blood test strip Use As Directed 100 each 0   HYDROcodone-acetaminophen (NORCO/VICODIN) 5-325 MG tablet Take 1-2 tablets by mouth every 6 (six) hours as needed for moderate pain. 30 tablet 0   insulin glargine-yfgn (SEMGLEE) 100 UNIT/ML Pen Inject 40 Units into the skin daily. 12 mL 5   Insulin Pen Needle 32G X 4 MM MISC Use As Directed 100 each 0   lisinopril (ZESTRIL) 5 MG tablet Take 1 tablet (5 mg total) by mouth daily. 30 tablet 3   metFORMIN (GLUCOPHAGE) 500 MG tablet Take 1 tablet (500 mg total) by mouth 2 (two) times daily with a meal. 60 tablet 0   methocarbamol (ROBAXIN) 500 MG tablet Take 1 tablet (500 mg total) by mouth every 6 (six) hours as needed for muscle spasms. 20 tablet 0   sulfamethoxazole-trimethoprim (BACTRIM DS) 800-160 MG tablet Take 2 tablets by mouth 2 (two) times daily. 120 tablet 0   No current facility-administered medications for this visit.     Physical Exam: Blood pressure 117/82, pulse  93, resp. rate 18, height 5' 3"  (1.6 m), weight 142 lb 12.8 oz (64.8 kg), SpO2 99 %.   Lungs clear Neuro intact Heart rate regular without murmur Wound inspected, fairly clean granulation tissue.  I applied silver nitrate topically to the unhealthy granulation tissue at the depth of the wound.  I repacked the wound with 2 x 2 gauze saline wet-to-dry.  Diagnostic Tests: None    Impression: Improving infection of the left sternoclavicular joint from Klebsiella pneumonia. Recently diagnosed diabetes now compliant with meds  Plan: Continue oral antibiotic.  Patient advised to not take the Bactrim on an empty stomach  Continue daily wet-to-dry dressing changes and packing as I personally demonstrated the technique to the patient's wife in the exam room.  Return for wound check in 1 week.   Dahlia Byes, MD [facility] [phone]

## 2021-11-25 ENCOUNTER — Other Ambulatory Visit: Payer: Self-pay

## 2021-11-25 ENCOUNTER — Encounter: Payer: Self-pay | Admitting: Student

## 2021-11-25 ENCOUNTER — Ambulatory Visit (INDEPENDENT_AMBULATORY_CARE_PROVIDER_SITE_OTHER): Payer: Self-pay | Admitting: Student

## 2021-11-25 VITALS — BP 131/85 | HR 94 | Ht 64.0 in | Wt 144.9 lb

## 2021-11-25 DIAGNOSIS — E1165 Type 2 diabetes mellitus with hyperglycemia: Secondary | ICD-10-CM

## 2021-11-25 DIAGNOSIS — E782 Mixed hyperlipidemia: Secondary | ICD-10-CM

## 2021-11-25 DIAGNOSIS — G8929 Other chronic pain: Secondary | ICD-10-CM

## 2021-11-25 DIAGNOSIS — Z794 Long term (current) use of insulin: Secondary | ICD-10-CM

## 2021-11-25 DIAGNOSIS — I10 Essential (primary) hypertension: Secondary | ICD-10-CM

## 2021-11-25 DIAGNOSIS — M00812 Arthritis due to other bacteria, left shoulder: Secondary | ICD-10-CM

## 2021-11-25 DIAGNOSIS — M549 Dorsalgia, unspecified: Secondary | ICD-10-CM | POA: Insufficient documentation

## 2021-11-25 DIAGNOSIS — M545 Low back pain, unspecified: Secondary | ICD-10-CM

## 2021-11-25 DIAGNOSIS — M009 Pyogenic arthritis, unspecified: Secondary | ICD-10-CM

## 2021-11-25 MED ORDER — METFORMIN HCL 500 MG PO TABS
500.0000 mg | ORAL_TABLET | Freq: Two times a day (BID) | ORAL | 3 refills | Status: DC
Start: 2021-11-25 — End: 2021-12-28
  Filled 2021-11-25: qty 60, 30d supply, fill #0

## 2021-11-25 MED ORDER — ATORVASTATIN CALCIUM 20 MG PO TABS
20.0000 mg | ORAL_TABLET | Freq: Every day | ORAL | 11 refills | Status: DC
  Filled 2021-11-25: qty 30, 30d supply, fill #0

## 2021-11-25 MED ORDER — AMLODIPINE BESYLATE 5 MG PO TABS
5.0000 mg | ORAL_TABLET | Freq: Every day | ORAL | 11 refills | Status: DC
  Filled 2021-11-25: qty 30, 30d supply, fill #0

## 2021-11-25 NOTE — Patient Instructions (Addendum)
  Muchas gracias por venir a la clnica hoy!  Hablamos de algunas cosas hoy aqu muestra el resumen rpido:  1. Hablamos sobre su curso en el hospital y cmo se est recuperando bien de la ciruga que [place]. Tienes cita con la infectocontagiosa en 2 das, y creo que todo va a Database administrator.  2. Tambin hablamos de tu presin arterial y de cmo te va bastante bien, te recomiendo que te tomes la presin arterial en casa.  3. 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  4. Tambin hablamos sobre su diabetes hoy y, por favor, siga tomando los medicamentos. Tambin le dar un resurtido [employer].  Muchas gracias!  Dr. Thomasene Ripple      Thank you so much for coming to the clinic today!  We talked about a few things today here shows the quick summary:  1.  We talked about your hospital course and how you are recovering well from the surgery that you had.  You have an appointment with the infectious disease in 2 days, and I think everything is going to go well.  2.  We also talked about your blood pressure and how it is doing pretty well, I recommend you take your blood pressure readings at home.  3.  We are also going to get some labs today just to check on your cholesterol. 4.  We also talked about your diabetes today, and please keep taking the medications I will give you a refill as well today   Thank you so much!  Dr. Thomasene Ripple

## 2021-11-25 NOTE — Assessment & Plan Note (Signed)
Patient is on atorvastatin 20 mg, however no recent lipid panel has been done.  Plan:  1.  Lipid panel was done today in clinic, will call patient back with results.

## 2021-11-25 NOTE — Assessment & Plan Note (Signed)
Patient's last A1c was on June 1, and it was 13.2.  Patient says that his sugar levels have gotten significantly better since then, averaging around 70-95 fasting sugar.  Patient is currently on metformin 500 twice daily, and Semglee 400 units daily.  Patient's diet and exercise has been good, has been able to ambulate properly after his stay in the hospital.  Patient also claims that his diet is very good.  Plan: 1.  Continue current medication regimen. 2.  Requested refills for metformin, and also has insulin which I will put in.

## 2021-11-25 NOTE — Progress Notes (Signed)
CC: Initiation of care/back pain  HPI:  [name] is a 51 y.o. male living with a history stated below and presents today to initiate primary care and a hospital follow up for septic arthritis. Please see problem based assessment and plan for additional details.  Patient was seen with interpreter, whose name is Fuller Song, [place] Number: [id number]  Past Medical History:  Diagnosis Date   Diabetes mellitus without complication ([place])    Hypertension     Current Outpatient Medications on File Prior to Visit  Medication Sig Dispense Refill   Accu-Chek Softclix Lancets lancets Use As Directed 100 each 0   acetaminophen (TYLENOL) 500 MG tablet Take 500 mg by mouth every 6 (six) hours as needed for moderate pain.     Blood Glucose Monitoring Suppl (ACCU-CHEK GUIDE) w/Device KIT Use As Directed 1 kit 0   diclofenac Sodium (VOLTAREN) 1 % GEL Apply 4 g topically 4 (four) times daily. (Patient taking differently: Apply 4 g topically 2 (two) times daily.) 100 g 0   glucose blood test strip Use As Directed 100 each 0   HYDROcodone-acetaminophen (NORCO/VICODIN) 5-325 MG tablet Take 1-2 tablets by mouth every 6 (six) hours as needed for moderate pain. 30 tablet 0   insulin glargine-yfgn (SEMGLEE) 100 UNIT/ML Pen Inject 40 Units into the skin daily. 12 mL 5   Insulin Pen Needle 32G X 4 MM MISC Use As Directed 100 each 0   lisinopril (ZESTRIL) 5 MG tablet Take 1 tablet (5 mg total) by mouth daily. 30 tablet 3   methocarbamol (ROBAXIN) 500 MG tablet Take 1 tablet (500 mg total) by mouth every 6 (six) hours as needed for muscle spasms. 20 tablet 0   sulfamethoxazole-trimethoprim (BACTRIM DS) 800-160 MG tablet Take 2 tablets by mouth 2 (two) times daily. 120 tablet 0   No current facility-administered medications on file prior to visit.    Family History  Problem Relation Age of Onset   Healthy Mother    Hypertension Brother    Diabetes Brother    Heart disease Neg Hx     Social History    Socioeconomic History   Marital status: Married    Spouse name: Verdis Frederickson   Number of children: 3   Years of education: Not on file   Highest education level: Not on file  Occupational History   Not on file  Tobacco Use   Smoking status: Never   Smokeless tobacco: Not on file  Vaping Use   Vaping Use: Never used  Substance and Sexual Activity   Alcohol use: Not Currently    Comment: little   Drug use: No   Sexual activity: Not on file  Other Topics Concern   Not on file  Social History Narrative   Not on file   Social Determinants of Health   Financial Resource Strain: Not on file  Food Insecurity: Not on file  Transportation Needs: Not on file  Physical Activity: Not on file  Stress: Not on file  Social Connections: Not on file  Intimate Partner Violence: Not on file    Review of Systems: ROS negative except for what is noted on the assessment and plan.  Vitals:   11/25/21 1342  BP: 131/85  Pulse: 94  SpO2: 100%  Weight: 144 lb 14.4 oz (65.7 kg)  Height: _0  (1.626 m)    Physical Exam: Constitutional: well-appearing man sitting comfortably, in no acute distress HENT: normocephalic atraumatic, mucous membranes moist Cardiovascular: regular rate and rhythm, no  m/r/g Pulmonary/Chest: normal work of breathing on room air, lungs clear to auscultation bilaterally. Wound with wet/dry packing on left chest wall.  MSK: normal bulk and tone. No erythema or tenderness on palpation on back.  Skin: warm and dry   Assessment & Plan:   Uncontrolled type 2 diabetes mellitus with hyperglycemia, without long-term current use of insulin ([place]) Patient's last A1c was on June 1, and it was 13.2.  Patient says that his sugar levels have gotten significantly better since then, averaging around 70-95 fasting sugar.  Patient is currently on metformin 500 twice daily, and Semglee 400 units daily.  Patient's diet and exercise has been good, has been able to ambulate properly after  his stay in the hospital.  Patient also claims that his diet is very good.  Plan: 1.  Continue current medication regimen. 2.  Requested refills for metformin, and also has insulin which I will put in.  Septic arthritis of sternoclavicular joint, left ([place]) Patient was admitted to the hospital on November 04, 2021, presented to the emergency department with left shoulder pain.  Upon further examination, the culprit was septic arthritis of the sternoclavicular joint.  Klebsiella pneumonia was deemed to be the agent responsible.  Patient underwent surgical debridement on June 23, and was discharged from the hospital on July 3.  Patient says recovery is going well, no discharge, bleeding, or pus on the wound.  Patient also denies any pain on the wound.  Patient is able to ambulate around his house.  Patient is currently on Bactrim.  Plan:  1.  Patient has an infectious disease follow-up on July 14th, will follow up with patient after his appointment.  Mixed hyperlipidemia Patient is on atorvastatin 20 mg, however no recent lipid panel has been done.  Plan:  1.  Lipid panel was done today in clinic, will call patient back with results.  Essential hypertension Patient's blood pressure today was 131/85.  Patient denies any chest pain, dizziness, blurry vision.  He is currently on lisinopril 5 mg, and amlodipine 5 mg and denies any side effects.  BMP was done on June 30 showing normal results.  Plan:  1.  Continue medical regimen. 2.  Encourage patient to take blood pressure regularly at home  Back pain Patient claims he has a herniated disc for years.  Patient states that his back has been really bothering him specifically for the past couple days.  He says that the pain is constant and feels like it is inflamed.  The only thing that makes it better is the pain medication that he has which is the acetaminophen 500 mg.  He says the pain gets a lot worse when he is bending down to pick something  up, or sitting down on a chair.  Patient denies any trouble urinating, or having a bowel movement.  Due to patient's history of septic arthritis, we will monitor closely.  On physical exam patient says the pain is the lower and mid back down the midline, however at the time was not tender to palpation, and there are no signs of inflammation.  Plan: 1.  Continue taking the Tylenol 500 mg 2.  Encourage patient to ambulate a little bit more and move around the house, as well as stretch 3.  We will evaluate at next visit to see if further work-up needs to be done  Patient seen with Dr. Charissa Bash Oleta Gunnoe, M.D. [facility], PGY-1 Phone: [phone] Date 11/25/2021 Time 8:21 PM

## 2021-11-25 NOTE — Assessment & Plan Note (Signed)
Patient's blood pressure today was 131/85.  Patient denies any chest pain, dizziness, blurry vision.  He is currently on lisinopril 5 mg, and amlodipine 5 mg and denies any side effects.  BMP was done on June 30 showing normal results.  Plan:  1.  Continue medical regimen. 2.  Encourage patient to take blood pressure regularly at home

## 2021-11-25 NOTE — Assessment & Plan Note (Addendum)
Patient claims he has a herniated disc for years.  Patient states that his back has been really bothering him specifically for the past couple days.  He says that the pain is constant and feels like it is inflamed.  The only thing that makes it better is the pain medication that he has which is the acetaminophen 500 mg.  He says the pain gets a lot worse when he is bending down to pick something up, or sitting down on a chair.  Patient denies any trouble urinating, or having a bowel movement.  Due to patient's history of septic arthritis, we will monitor closely.  On physical exam patient says the pain is the lower and mid back down the midline, however at the time was not tender to palpation, and there are no signs of inflammation.  Plan: 1.  Continue taking the Tylenol 500 mg 2.  Encourage patient to ambulate a little bit more and move around the house, as well as stretch 3.  We will evaluate at next visit to see if further work-up needs to be done

## 2021-11-25 NOTE — Assessment & Plan Note (Addendum)
Patient was admitted to the hospital on November 04, 2021, presented to the emergency department with left shoulder pain.  Upon further examination, the culprit was septic arthritis of the sternoclavicular joint.  Klebsiella pneumonia was deemed to be the agent responsible.  Patient underwent surgical debridement on June 23, and was discharged from the hospital on July 3.  Patient says recovery is going well, no discharge, bleeding, or pus on the wound.  Patient also denies any pain on the wound.  Patient is able to ambulate around his house.  Patient is currently on Bactrim.  Plan:  1.  Patient has an infectious disease follow-up on July 14th, will follow up with patient after his appointment.

## 2021-11-26 ENCOUNTER — Ambulatory Visit (INDEPENDENT_AMBULATORY_CARE_PROVIDER_SITE_OTHER): Payer: Self-pay | Admitting: Internal Medicine

## 2021-11-26 ENCOUNTER — Other Ambulatory Visit: Payer: Self-pay

## 2021-11-26 ENCOUNTER — Other Ambulatory Visit (HOSPITAL_COMMUNITY): Payer: Self-pay

## 2021-11-26 DIAGNOSIS — M009 Pyogenic arthritis, unspecified: Secondary | ICD-10-CM

## 2021-11-26 LAB — LIPID PANEL
Chol/HDL Ratio: 5 ratio (ref 0.0–5.0)
Cholesterol, Total: 160 mg/dL (ref 100–199)
HDL: 32 mg/dL — ABNORMAL LOW (ref >39)
LDL Chol Calc (NIH): 75 mg/dL (ref 0–99)
Triglycerides: 327 mg/dL — ABNORMAL HIGH (ref 0–149)
VLDL Cholesterol Cal: 53 mg/dL — ABNORMAL HIGH (ref 5–40)

## 2021-11-26 NOTE — Progress Notes (Signed)
Internal Medicine Clinic Attending  I saw and evaluated the patient.  I personally confirmed the key portions of the history and exam documented by Dr. Nooruddin and I reviewed pertinent patient test results.  The assessment, diagnosis, and plan were formulated together and I agree with the documentation in the resident's note.  

## 2021-11-26 NOTE — Addendum Note (Signed)
Addended by: Erlinda Hong T on: 11/26/2021 10:03 AM   Modules accepted: Level of Service

## 2021-11-26 NOTE — Assessment & Plan Note (Signed)
He appears to be improving following recent incision and drainage and current antibiotic therapy.  I will check his CBC, BMP, sed rate and C-reactive protein today and see him back on 12/15/2021 to determine optimal duration of antibiotic therapy.

## 2021-11-26 NOTE — Progress Notes (Signed)
[facility]  Patient Active Problem List   Diagnosis Date Noted   Back pain 11/25/2021   Encounter for postoperative wound check 11/23/2021   Septic arthritis of sternoclavicular joint, left ([place]) 11/04/2021   Liver abscess 10/19/2021   Pneumonia of both lungs due to Klebsiella pneumoniae ([place]) 10/15/2021   Essential hypertension 10/15/2021   Mixed hyperlipidemia 10/15/2021   Lactic acidosis 10/15/2021   Bacteremia due to Klebsiella pneumoniae 10/14/2021   TINEA CORPORIS 08/22/2008   Uncontrolled type 2 diabetes mellitus with hyperglycemia, without long-term current use of insulin ([place]) 03/07/2008   MICROSCOPIC HEMATURIA 03/07/2008   GLYCOSURIA 03/07/2008   GASTROESOPHAGEAL REFLUX DISEASE 01/25/2008   CONSTIPATION 01/25/2008    Patient's Medications  New Prescriptions   No medications on file  Previous Medications   ACCU-CHEK SOFTCLIX LANCETS LANCETS    Use As Directed   ACETAMINOPHEN (TYLENOL) 500 MG TABLET    Take 500 mg by mouth every 6 (six) hours as needed for moderate pain.   AMLODIPINE (NORVASC) 5 MG TABLET    Take 1 tablet (5 mg total) by mouth daily.   ATORVASTATIN (LIPITOR) 20 MG TABLET    Take 1 tablet (20 mg total) by mouth daily.   BLOOD GLUCOSE MONITORING SUPPL (ACCU-CHEK GUIDE) W/DEVICE KIT    Use As Directed   DICLOFENAC SODIUM (VOLTAREN) 1 % GEL    Apply 4 g topically 4 (four) times daily.   GLUCOSE BLOOD TEST STRIP    Use As Directed   HYDROCODONE-ACETAMINOPHEN (NORCO/VICODIN) 5-325 MG TABLET    Take 1-2 tablets by mouth every 6 (six) hours as needed for moderate pain.   INSULIN GLARGINE-YFGN (SEMGLEE) 100 UNIT/ML PEN    Inject 40 Units into the skin daily.   INSULIN PEN NEEDLE 32G X 4 MM MISC    Use As Directed   LISINOPRIL (ZESTRIL) 5 MG TABLET    Take 1 tablet (5 mg total) by mouth daily.   METFORMIN (GLUCOPHAGE) 500 MG TABLET    Take 1 tablet (500 mg total) by mouth 2 (two) times daily with a meal.   METHOCARBAMOL  (ROBAXIN) 500 MG TABLET    Take 1 tablet (500 mg total) by mouth every 6 (six) hours as needed for muscle spasms.   SULFAMETHOXAZOLE-TRIMETHOPRIM (BACTRIM DS) 800-160 MG TABLET    Take 2 tablets by mouth 2 (two) times daily.  Modified Medications   No medications on file  Discontinued Medications   No medications on file    Subjective: Benjamin Thompson is in for a hospital follow-up visit.  He has recently diagnosed diabetes.  He was hospitalized from May 31 to June 6.  He had a 2 cm liver abscess drained which grew Klebsiella.  Blood cultures were also positive for Klebsiella.  Percutaneous drainage catheter was not required.  He was discharged on cefadroxil and metronidazole with a plan to continue that through July 2.  I asked him several different times if he was still taking his antibiotics when he was readmitted to the hospital.  Initially he indicated that he was not but then that he was I am not sure that he understood my question.  However, he developed sudden pain and swelling over his left sternoclavicular joint and was readmitted on 11/04/2021.  Blood cultures were negative.  He underwent incision and drainage of the left sternoclavicular joint on 11/06/2021.  Cultures grew Klebsiella again which remains susceptible to first generation cephalosporins.  He was discharged on  11/16/2021 on oral trimethoprim/sulfamethoxazole.  He says he is taking it twice daily with food.  He denies having any problems tolerating trimethoprim/sulfamethoxazole.  He has had some intermittent nausea which he believes might be due to other new medications.  He has not had any further fever.  The pain and swelling over his left sternoclavicular joint is improving.  He saw his surgeon, Dr. Prescott Gum on 11/23/2021 who indicated that his infection was improving.  Recently, he has had a few episodes of mild discomfort in his right flank when he is laying on his right side.  He says he has wondered if that could be related to the  liver abscess coming back.  Review of Systems: Review of Systems  Constitutional:  Negative for chills, diaphoresis and fever.  Gastrointestinal:  Positive for nausea. Negative for abdominal pain, diarrhea and vomiting.  Genitourinary:  Positive for flank pain.  Musculoskeletal:  Positive for joint pain.    Past Medical History:  Diagnosis Date   Diabetes mellitus without complication ([place])    Hypertension     Social History   Tobacco Use   Smoking status: Never  Vaping Use   Vaping Use: Never used  Substance Use Topics   Alcohol use: Not Currently    Comment: little   Drug use: No    Family History  Problem Relation Age of Onset   Healthy Mother    Hypertension Brother    Diabetes Brother    Heart disease Neg Hx     No Known Allergies  Objective: Vitals:   11/26/21 1026  BP: 122/83  Pulse: 82  Temp: 98 F (36.7 C)  Weight: 145 lb (65.8 kg)  Height: 5' 4" (1.626 m)   Body mass index is 24.89 kg/m.  Physical Exam Constitutional:      Comments: He is pleasant and in good spirits.  He was examined with the aid of the Spanish video interpreter.  Cardiovascular:     Rate and Rhythm: Normal rate and regular rhythm.     Heart sounds: No murmur heard. Pulmonary:     Effort: Pulmonary effort is normal.     Breath sounds: Normal breath sounds.  Abdominal:     Palpations: Abdomen is soft. There is no mass.     Tenderness: There is no abdominal tenderness. There is no right CVA tenderness or left CVA tenderness.  Musculoskeletal:     Comments: His left sternoclavicular joint wound is packed with gauze that is bloodstained.  There is no unusual surrounding erythema or fluctuance.  Psychiatric:        Mood and Affect: Mood normal.     Lab Results    Problem List Items Addressed This Visit       Unprioritized   Septic arthritis of sternoclavicular joint, left ([place])    He appears to be improving following recent incision and drainage and current  antibiotic therapy.  I will check his CBC, BMP, sed rate and C-reactive protein today and see him back on 12/15/2021 to determine optimal duration of antibiotic therapy.      Relevant Orders   CBC   Basic metabolic panel   C-reactive protein   Sedimentation rate     Benjamin Bickers, MD [facility] [place] [phone] pager   [phone] cell 11/26/2021, 11:07 AM

## 2021-11-27 ENCOUNTER — Other Ambulatory Visit: Payer: Self-pay | Admitting: *Deleted

## 2021-11-27 ENCOUNTER — Other Ambulatory Visit (HOSPITAL_COMMUNITY): Payer: Self-pay

## 2021-11-27 LAB — C-REACTIVE PROTEIN: CRP: 2.9 mg/L (ref <8.0)

## 2021-11-27 LAB — BASIC METABOLIC PANEL
BUN: 14 mg/dL (ref 7–25)
CO2: 26 mmol/L (ref 20–32)
Calcium: 9.3 mg/dL (ref 8.6–10.3)
Chloride: 103 mmol/L (ref 98–110)
Creat: 0.87 mg/dL (ref 0.60–1.29)
Glucose, Bld: 144 mg/dL — ABNORMAL HIGH (ref 65–99)
Potassium: 4.2 mmol/L (ref 3.5–5.3)
Sodium: 137 mmol/L (ref 135–146)

## 2021-11-27 LAB — CBC
HCT: 42.1 % (ref 38.5–50.0)
Hemoglobin: 13.9 g/dL (ref 13.2–17.1)
MCH: 28.5 pg (ref 27.0–33.0)
MCHC: 33 g/dL (ref 32.0–36.0)
MCV: 86.3 fL (ref 80.0–100.0)
MPV: 10.1 fL (ref 7.5–12.5)
Platelets: 340 10*3/uL (ref 140–400)
RBC: 4.88 10*6/uL (ref 4.20–5.80)
RDW: 13.6 % (ref 11.0–15.0)
WBC: 7.1 10*3/uL (ref 3.8–10.8)

## 2021-11-27 LAB — SEDIMENTATION RATE: Sed Rate: 45 mm/h — ABNORMAL HIGH (ref 0–15)

## 2021-11-27 MED ORDER — ACETAMINOPHEN 500 MG PO TABS
500.0000 mg | ORAL_TABLET | Freq: Four times a day (QID) | ORAL | 0 refills | Status: DC | PRN
  Filled 2021-11-27: qty 30, 8d supply, fill #0

## 2021-11-27 MED ORDER — INSULIN GLARGINE-YFGN 100 UNIT/ML ~~LOC~~ SOPN
40.0000 [IU] | PEN_INJECTOR | Freq: Every day | SUBCUTANEOUS | 5 refills | Status: DC
  Filled 2021-11-27 (×2): qty 12, 30d supply, fill #0

## 2021-11-27 NOTE — Addendum Note (Signed)
Addended byOlegario Messier on: 11/27/2021 11:43 AM   Modules accepted: Orders

## 2021-11-27 NOTE — Telephone Encounter (Signed)
Patient called in [facility] requesting refill on [place].  Also, thought PCP was writing for new medicine for back pain and inflammation. Please advise.

## 2021-11-30 ENCOUNTER — Encounter: Payer: Self-pay | Admitting: Cardiothoracic Surgery

## 2021-11-30 ENCOUNTER — Ambulatory Visit: Payer: Self-pay | Admitting: Cardiothoracic Surgery

## 2021-11-30 ENCOUNTER — Ambulatory Visit (INDEPENDENT_AMBULATORY_CARE_PROVIDER_SITE_OTHER): Payer: Self-pay | Admitting: Cardiothoracic Surgery

## 2021-11-30 VITALS — BP 131/86 | HR 84 | Resp 20 | Ht 64.0 in | Wt 146.0 lb

## 2021-11-30 DIAGNOSIS — M009 Pyogenic arthritis, unspecified: Secondary | ICD-10-CM

## 2021-11-30 NOTE — Progress Notes (Signed)
HPI: Patient returns for wound check of the left sternoclavicular debridement site.  Patient grew out Klebsiella and is currently on oral Septra.  His wife performs daily wet-to-dry dressing changes.  His symptomatic pain and left shoulder discomfort continues to improve.  No fever.  I performed a wound dressing change.  There is some unhealthy granulation tissue at the surface which was sharply debrided.  The wound was cauterized with silver nitrate and a new 2 x 2 wet-to-dry saline packing was applied.  The depth of the wound is approximately 2.5-3 cm.  Patient is not taking insulin due to his expense.  He is taking twice daily metformin and states his morning blood sugars run about 100-1 10.  Current Outpatient Medications  Medication Sig Dispense Refill   Accu-Chek Softclix Lancets lancets Use As Directed 100 each 0   acetaminophen (TYLENOL) 500 MG tablet Take 1 tablet (500 mg total) by mouth every 6 (six) hours as needed for moderate pain. 30 tablet 0   amLODipine (NORVASC) 5 MG tablet Take 1 tablet (5 mg total) by mouth daily. 30 tablet 11   atorvastatin (LIPITOR) 20 MG tablet Take 1 tablet (20 mg total) by mouth daily. 30 tablet 11   Blood Glucose Monitoring Suppl (ACCU-CHEK GUIDE) w/Device KIT Use As Directed 1 kit 0   diclofenac Sodium (VOLTAREN) 1 % GEL Apply 4 g topically 4 (four) times daily. (Patient taking differently: Apply 4 g topically 2 (two) times daily.) 100 g 0   glucose blood test strip Use As Directed 100 each 0   insulin glargine-yfgn (SEMGLEE) 100 UNIT/ML Pen Inject 40 Units into the skin daily. 12 mL 5   Insulin Pen Needle 32G X 4 MM MISC Use As Directed 100 each 0   lisinopril (ZESTRIL) 5 MG tablet Take 1 tablet (5 mg total) by mouth daily. 30 tablet 3   metFORMIN (GLUCOPHAGE) 500 MG tablet Take 1 tablet (500 mg total) by mouth 2 (two) times daily with a meal. 60 tablet 3   methocarbamol (ROBAXIN) 500 MG tablet Take 1 tablet (500 mg total) by mouth every 6 (six) hours  as needed for muscle spasms. 20 tablet 0   sulfamethoxazole-trimethoprim (BACTRIM DS) 800-160 MG tablet Take 2 tablets by mouth 2 (two) times daily. 120 tablet 0   No current facility-administered medications for this visit.     Physical Exam: Blood pressure 131/86, pulse 84, resp. rate 20, height 5' 4"  (1.626 m), weight 146 lb (66.2 kg), SpO2 96 %.   Left sternoclavicular joint wound with some fat necrosis and slimy granulation tissue which was sharply debrided.  New packing applied.  Diagnostic Tests: None  Impression: Slow progression of wound healing which is fairly typical for this site of infection.  I have asked the patient's wife to increase dressing changes to twice daily, a.m. and p.m.  We will continue to see him on about a weekly basis for wound debridement and evaluation.  He is to call us if he runs out of Septra as he will need a refill.  We discussed the importance of a diabetic diet since he cannot afford insulin.  Plan: Return for wound check July 21   Dahlia Byes, MD [facility] [phone]

## 2021-12-01 ENCOUNTER — Ambulatory Visit: Payer: Self-pay | Admitting: Internal Medicine

## 2021-12-03 ENCOUNTER — Encounter: Payer: Self-pay | Admitting: Cardiothoracic Surgery

## 2021-12-03 ENCOUNTER — Ambulatory Visit (INDEPENDENT_AMBULATORY_CARE_PROVIDER_SITE_OTHER): Payer: Self-pay | Admitting: Cardiothoracic Surgery

## 2021-12-03 ENCOUNTER — Other Ambulatory Visit: Payer: Self-pay

## 2021-12-03 VITALS — BP 135/86 | HR 86 | Resp 20 | Wt 148.0 lb

## 2021-12-03 DIAGNOSIS — Z5189 Encounter for other specified aftercare: Secondary | ICD-10-CM

## 2021-12-03 DIAGNOSIS — M009 Pyogenic arthritis, unspecified: Secondary | ICD-10-CM

## 2021-12-03 MED ORDER — HYDROCODONE-ACETAMINOPHEN 5-325 MG PO TABS: 1.0000 | ORAL_TABLET | Freq: Four times a day (QID) | ORAL | 0 refills | Status: AC | PRN

## 2021-12-03 MED ORDER — CEPHALEXIN 500 MG PO CAPS
500.0000 mg | ORAL_CAPSULE | Freq: Three times a day (TID) | ORAL | 1 refills | Status: DC
  Filled 2021-12-03: qty 21, 7d supply, fill #0
  Filled 2021-12-10: qty 21, 7d supply, fill #1

## 2021-12-03 NOTE — Progress Notes (Signed)
HPI: Patient returns for wound check. Patient has developed nausea and intolerance to the Bactrim He has Klebsiella sensitive to cephalosporins and will be changed to Keflex 500 mg p.o. 3 times daily Patient requests more hydrocodone-acetaminophen and we given 1 refill The patient's wife is packing the wound twice a day and it looks better today but still some unhealthy granulation tissue which is cauterized with silver nitrate  Current Outpatient Medications  Medication Sig Dispense Refill   Accu-Chek Softclix Lancets lancets Use As Directed 100 each 0   acetaminophen (TYLENOL) 500 MG tablet Take 1 tablet (500 mg total) by mouth every 6 (six) hours as needed for moderate pain. 30 tablet 0   amLODipine (NORVASC) 5 MG tablet Take 1 tablet (5 mg total) by mouth daily. 30 tablet 11   atorvastatin (LIPITOR) 20 MG tablet Take 1 tablet (20 mg total) by mouth daily. 30 tablet 11   Blood Glucose Monitoring Suppl (ACCU-CHEK GUIDE) w/Device KIT Use As Directed 1 kit 0   cephALEXin (KEFLEX) 500 MG capsule Take 1 capsule (500 mg total) by mouth 3 (three) times daily. 21 capsule 1   diclofenac Sodium (VOLTAREN) 1 % GEL Apply 4 g topically 4 (four) times daily. (Patient taking differently: Apply 4 g topically 2 (two) times daily.) 100 g 0   glucose blood test strip Use As Directed 100 each 0   HYDROcodone-acetaminophen (NORCO/VICODIN) 5-325 MG tablet Take 1 tablet by mouth every 6 (six) hours as needed for up to 7 days for moderate pain. 28 tablet 0   insulin glargine-yfgn (SEMGLEE) 100 UNIT/ML Pen Inject 40 Units into the skin daily. 12 mL 5   Insulin Pen Needle 32G X 4 MM MISC Use As Directed 100 each 0   lisinopril (ZESTRIL) 5 MG tablet Take 1 tablet (5 mg total) by mouth daily. 30 tablet 3   metFORMIN (GLUCOPHAGE) 500 MG tablet Take 1 tablet (500 mg total) by mouth 2 (two) times daily with a meal. 60 tablet 3   methocarbamol (ROBAXIN) 500 MG tablet Take 1 tablet (500 mg total) by mouth every 6 (six)  hours as needed for muscle spasms. 20 tablet 0   No current facility-administered medications for this visit.     Physical Exam: Blood pressure 135/86, pulse 86, resp. rate 20, weight 148 lb (67.1 kg), SpO2 95 %.  Appears comfortable Lungs clear Heart rate regular without murmur Wound-wound inspected.  Old dressing removed.  Minimal amount of unhealthy granulation tissue, slimy and weak, is removed with dry gauze.  Silver nitrate chemical cauterization of the wound is achieved. Activated collagen is deposited in the base of the wound which now is 2-1/2 to 3 cm. On top of the cut activated collagen 2 x 2 wet-to-dry gauze is applied and then a 4 x 4 dry gauze.  The wife is instructed to do this twice daily.  Diagnostic Tests: None  Impression: Left sternoclavicular joint infection is healing slowly as is the typical situation.  The patient states he has been using his metformin. Dressing changes have been increased to twice daily wet-to-dry. Today we are adding activated collagen to help stimulate healing Antibiotic has been changed from Bactrim to Keflex for the Klebsiella due to intolerance to the sulfa drug with nausea and not much improvement as well. Plan: Return for wound check on July 31.  Continue twice daily dressing changes.  Continue to use the activated collagen ointment for the evening dressing change.   Peter VanTrigt, MD [facility] [phone]

## 2021-12-04 ENCOUNTER — Encounter: Payer: Self-pay | Admitting: Student

## 2021-12-04 ENCOUNTER — Ambulatory Visit: Payer: Self-pay | Admitting: Cardiothoracic Surgery

## 2021-12-10 ENCOUNTER — Other Ambulatory Visit: Payer: Self-pay

## 2021-12-11 ENCOUNTER — Other Ambulatory Visit: Payer: Self-pay

## 2021-12-14 ENCOUNTER — Ambulatory Visit (INDEPENDENT_AMBULATORY_CARE_PROVIDER_SITE_OTHER): Payer: Self-pay | Admitting: Cardiothoracic Surgery

## 2021-12-14 ENCOUNTER — Ambulatory Visit: Payer: Self-pay | Admitting: Cardiothoracic Surgery

## 2021-12-14 ENCOUNTER — Other Ambulatory Visit: Payer: Self-pay

## 2021-12-14 ENCOUNTER — Encounter: Payer: Self-pay | Admitting: Cardiothoracic Surgery

## 2021-12-14 VITALS — BP 138/92 | HR 72 | Resp 20 | Ht 64.0 in | Wt 150.1 lb

## 2021-12-14 DIAGNOSIS — Z5189 Encounter for other specified aftercare: Secondary | ICD-10-CM

## 2021-12-14 MED ORDER — TEMAZEPAM 15 MG PO CAPS
15.0000 mg | ORAL_CAPSULE | Freq: Every evening | ORAL | 0 refills | Status: DC | PRN
  Filled 2021-12-14: qty 30, 30d supply, fill #0

## 2021-12-14 MED ORDER — INSULIN GLARGINE-YFGN 100 UNIT/ML ~~LOC~~ SOPN
40.0000 [IU] | PEN_INJECTOR | Freq: Every day | SUBCUTANEOUS | 5 refills | Status: DC
  Filled 2021-12-14 – 2021-12-28 (×2): qty 12, 30d supply, fill #0

## 2021-12-14 NOTE — Progress Notes (Signed)
HPI: Scheduled office visit for wound check Left [place] joint infection-Klebsiella Twice daily dressing changes at home by wife.  Wound inspected and dressing personally changed today after debridement.  We will change from wet-to-dry dressing changes twice daily to hypochlorous acid wet-to-dry changes.  Stop the collagen activator.  Continue Keflex 500 3 times daily Continue diabetic control  Current Outpatient Medications  Medication Sig Dispense Refill   Accu-Chek Softclix Lancets lancets Use As Directed 100 each 0   acetaminophen (TYLENOL) 500 MG tablet Take 1 tablet (500 mg total) by mouth every 6 (six) hours as needed for moderate pain. 30 tablet 0   amLODipine (NORVASC) 5 MG tablet Take 1 tablet (5 mg total) by mouth daily. 30 tablet 11   atorvastatin (LIPITOR) 20 MG tablet Take 1 tablet (20 mg total) by mouth daily. 30 tablet 11   Blood Glucose Monitoring Suppl (ACCU-CHEK GUIDE) w/Device KIT Use As Directed 1 kit 0   cephALEXin (KEFLEX) 500 MG capsule Take 1 capsule (500 mg total) by mouth 3 (three) times daily. 21 capsule 1   diclofenac Sodium (VOLTAREN) 1 % GEL Apply 4 g topically 4 (four) times daily. (Patient taking differently: Apply 4 g topically 2 (two) times daily.) 100 g 0   glucose blood test strip Use As Directed 100 each 0   Insulin Pen Needle 32G X 4 MM MISC Use As Directed 100 each 0   lisinopril (ZESTRIL) 5 MG tablet Take 1 tablet (5 mg total) by mouth daily. 30 tablet 3   metFORMIN (GLUCOPHAGE) 500 MG tablet Take 1 tablet (500 mg total) by mouth 2 (two) times daily with a meal. 60 tablet 3   methocarbamol (ROBAXIN) 500 MG tablet Take 1 tablet (500 mg total) by mouth every 6 (six) hours as needed for muscle spasms. 20 tablet 0   temazepam (RESTORIL) 15 MG capsule Take 1 capsule (15 mg total) by mouth at bedtime as needed for sleep. 30 capsule 0   insulin glargine-yfgn (SEMGLEE) 100 UNIT/ML Pen Inject 40 Units into the skin daily. 12 mL 5   No current  facility-administered medications for this visit.     Physical Exam: Blood pressure (!) 138/92, pulse 72, resp. rate 20, height _0  (1.626 m), weight 150 lb 1.6 oz (68.1 kg), SpO2 99 %.   Alert and comfortable No sternal or chest wall tenderness Heart rate regular Lungs clear  Dressing change performed personally. Poor quality granulation tissue excised from the dermis. The wound tracks 2 cm deep. The granulation tissue was cauterized with silver nitrate applicator and a wet-to-dry packing with hypochlorous acid was placed.  Diagnostic Tests: None  Impression: Slowly healing left [place] joint infection Diabetes  Plan: Continue twice daily wet-to-dry dressing changes but use hypochlorous acid instead of saline Continue oral Keflex Patient may go back to work at the [employer] 5 hours daily. New prescription for long-term insulin sent to the pharmacy Return for wound check in 1 week  Benjamin Byes, MD [facility] [phone]

## 2021-12-15 ENCOUNTER — Encounter: Payer: Self-pay | Admitting: Internal Medicine

## 2021-12-15 ENCOUNTER — Other Ambulatory Visit (HOSPITAL_COMMUNITY): Payer: Self-pay

## 2021-12-15 ENCOUNTER — Ambulatory Visit (INDEPENDENT_AMBULATORY_CARE_PROVIDER_SITE_OTHER): Payer: Self-pay | Admitting: Internal Medicine

## 2021-12-15 ENCOUNTER — Other Ambulatory Visit: Payer: Self-pay

## 2021-12-15 DIAGNOSIS — M009 Pyogenic arthritis, unspecified: Secondary | ICD-10-CM

## 2021-12-15 MED ORDER — CEPHALEXIN 500 MG PO CAPS
500.0000 mg | ORAL_CAPSULE | Freq: Three times a day (TID) | ORAL | 1 refills | Status: DC
  Filled 2021-12-15: qty 21, 7d supply, fill #0

## 2021-12-15 NOTE — Assessment & Plan Note (Signed)
His wound is healing slowly.  He will get repeat lab work today, continue cephalexin and follow-up in 2 weeks.

## 2021-12-15 NOTE — Progress Notes (Signed)
[facility]  Patient Active Problem List   Diagnosis Date Noted   Visit for wound care 12/03/2021   Back pain 11/25/2021   Encounter for post surgical wound check 11/23/2021   Septic arthritis of sternoclavicular joint, left ([place]) 11/04/2021   Liver abscess 10/19/2021   Pneumonia of both lungs due to Klebsiella pneumoniae ([place]) 10/15/2021   Essential hypertension 10/15/2021   Mixed hyperlipidemia 10/15/2021   Lactic acidosis 10/15/2021   Bacteremia due to Klebsiella pneumoniae 10/14/2021   TINEA CORPORIS 08/22/2008   Uncontrolled type 2 diabetes mellitus with hyperglycemia, without long-term current use of insulin ([place]) 03/07/2008   MICROSCOPIC HEMATURIA 03/07/2008   GLYCOSURIA 03/07/2008   GASTROESOPHAGEAL REFLUX DISEASE 01/25/2008   CONSTIPATION 01/25/2008    Patient's Medications  New Prescriptions   No medications on file  Previous Medications   ACCU-CHEK SOFTCLIX LANCETS LANCETS    Use As Directed   ACETAMINOPHEN (TYLENOL) 500 MG TABLET    Take 1 tablet (500 mg total) by mouth every 6 (six) hours as needed for moderate pain.   AMLODIPINE (NORVASC) 5 MG TABLET    Take 1 tablet (5 mg total) by mouth daily.   ATORVASTATIN (LIPITOR) 20 MG TABLET    Take 1 tablet (20 mg total) by mouth daily.   BLOOD GLUCOSE MONITORING SUPPL (ACCU-CHEK GUIDE) W/DEVICE KIT    Use As Directed   DICLOFENAC SODIUM (VOLTAREN) 1 % GEL    Apply 4 g topically 4 (four) times daily.   GLUCOSE BLOOD TEST STRIP    Use As Directed   INSULIN GLARGINE-YFGN (SEMGLEE) 100 UNIT/ML PEN    Inject 40 Units into the skin daily.   INSULIN PEN NEEDLE 32G X 4 MM MISC    Use As Directed   LISINOPRIL (ZESTRIL) 5 MG TABLET    Take 1 tablet (5 mg total) by mouth daily.   METFORMIN (GLUCOPHAGE) 500 MG TABLET    Take 1 tablet (500 mg total) by mouth 2 (two) times daily with a meal.   METHOCARBAMOL (ROBAXIN) 500 MG TABLET    Take 1 tablet (500 mg total) by mouth every 6 (six) hours as  needed for muscle spasms.   TEMAZEPAM (RESTORIL) 15 MG CAPSULE    Take 1 capsule (15 mg total) by mouth at bedtime as needed for sleep.  Modified Medications   Modified Medication Previous Medication   CEPHALEXIN (KEFLEX) 500 MG CAPSULE cephALEXin (KEFLEX) 500 MG capsule      Take 1 capsule (500 mg total) by mouth 3 (three) times daily.    Take 1 capsule (500 mg total) by mouth 3 (three) times daily.  Discontinued Medications   No medications on file    Subjective: Mr. Faulcon is in for a routine follow-up visit.  He has recently diagnosed diabetes.  He was hospitalized from May 31 to June 6.  He had a 2 cm liver abscess drained which grew Klebsiella.  Blood cultures were also positive for Klebsiella.  Percutaneous drainage catheter was not required.  He was discharged on cefadroxil and metronidazole with a plan to continue that through July 2.  I asked him several different times if he was still taking his antibiotics when he was readmitted to the hospital.  Initially he indicated that he was not but then that he was I am not sure that he understood my question.  However, he developed sudden pain and swelling over his left sternoclavicular joint and was readmitted on 11/04/2021.  Blood cultures were negative.  He underwent incision and drainage of the left sternoclavicular joint on 11/06/2021.  Cultures grew Klebsiella again which remains susceptible to first generation cephalosporins.  He was discharged on 11/16/2021 on oral trimethoprim/sulfamethoxazole.  He says he is taking it twice daily with food.  He denies having any problems tolerating trimethoprim/sulfamethoxazole.  He has had some intermittent nausea.  At the time of his last visit with me he indicated that he was tolerating trimethoprim/sulfamethoxazole.  However it sounds like the nausea continued so Dr. Prescott Gum changed him to cephalexin a few weeks ago.  His nausea resolved.  He saw Dr. Prescott Gum again yesterday who noted that his wound was  healing slowly.  He has had a few episodes of mild discomfort in his right flank when he is laying on his right side.  He says he has wondered if that could be related to the liver abscess coming back.  Review of Systems: Review of Systems  Constitutional:  Negative for chills, diaphoresis and fever.  Gastrointestinal:  Negative for abdominal pain, diarrhea, nausea and vomiting.  Genitourinary:  Positive for flank pain.  Musculoskeletal:  Positive for joint pain.    Past Medical History:  Diagnosis Date   Diabetes mellitus without complication ([place])    Hypertension     Social History   Tobacco Use   Smoking status: Never  Vaping Use   Vaping Use: Never used  Substance Use Topics   Alcohol use: Not Currently    Comment: little   Drug use: No    Family History  Problem Relation Age of Onset   Healthy Mother    Hypertension Brother    Diabetes Brother    Heart disease Neg Hx     No Known Allergies  Objective: Vitals:   12/15/21 1438  BP: 137/87  Pulse: 83  Resp: 16  SpO2: 97%  Weight: 150 lb (68 kg)  Height: 5' 4"  (1.626 m)   Body mass index is 25.75 kg/m.  Physical Exam Constitutional:      Comments: He is pleasant and in good spirits.  He was examined with the aid of the Spanish video interpreter.  Cardiovascular:     Rate and Rhythm: Normal rate and regular rhythm.     Heart sounds: No murmur heard. Pulmonary:     Effort: Pulmonary effort is normal.     Breath sounds: Normal breath sounds.  Abdominal:     Palpations: Abdomen is soft. There is no mass.     Tenderness: There is no abdominal tenderness. There is no right CVA tenderness or left CVA tenderness.  Musculoskeletal:     Comments: His left sternoclavicular joint wound is packed with gauze that is bloodstained.  There is no unusual surrounding fluctuance.  Psychiatric:        Mood and Affect: Mood normal.     Lab Results Sed Rate  Date Value  11/26/2021 45 mm/h (H)  11/04/2021 69 mm/hr  (H)  11/02/2021 91 mm/h (H)   CRP  Date Value  11/26/2021 2.9 mg/L  11/04/2021 1.6 mg/dL (H)  11/02/2021 15.8 mg/L (H)      Problem List Items Addressed This Visit       Unprioritized   Septic arthritis of sternoclavicular joint, left ([place])    His wound is healing slowly.  He will get repeat lab work today, continue cephalexin and follow-up in 2 weeks.      Relevant Medications   cephALEXin (KEFLEX) 500 MG capsule  Other Relevant Orders   CBC   Basic metabolic panel   C-reactive protein   Sedimentation rate     Michel Bickers, MD [facility] [place] [phone] pager   [phone] cell 12/15/2021, 3:20 PM

## 2021-12-16 ENCOUNTER — Other Ambulatory Visit (HOSPITAL_COMMUNITY): Payer: Self-pay

## 2021-12-16 LAB — SEDIMENTATION RATE: Sed Rate: 38 mm/h — ABNORMAL HIGH (ref 0–15)

## 2021-12-16 LAB — BASIC METABOLIC PANEL
BUN: 11 mg/dL (ref 7–25)
CO2: 27 mmol/L (ref 20–32)
Calcium: 9.6 mg/dL (ref 8.6–10.3)
Chloride: 101 mmol/L (ref 98–110)
Creat: 0.79 mg/dL (ref 0.60–1.29)
Glucose, Bld: 227 mg/dL — ABNORMAL HIGH (ref 65–99)
Potassium: 3.9 mmol/L (ref 3.5–5.3)
Sodium: 140 mmol/L (ref 135–146)

## 2021-12-16 LAB — CBC
HCT: 45.1 % (ref 38.5–50.0)
Hemoglobin: 15.1 g/dL (ref 13.2–17.1)
MCH: 28.8 pg (ref 27.0–33.0)
MCHC: 33.5 g/dL (ref 32.0–36.0)
MCV: 86.1 fL (ref 80.0–100.0)
MPV: 10.2 fL (ref 7.5–12.5)
Platelets: 290 10*3/uL (ref 140–400)
RBC: 5.24 10*6/uL (ref 4.20–5.80)
RDW: 14.3 % (ref 11.0–15.0)
WBC: 7.1 10*3/uL (ref 3.8–10.8)

## 2021-12-16 LAB — C-REACTIVE PROTEIN: CRP: 3.2 mg/L (ref <8.0)

## 2021-12-28 ENCOUNTER — Other Ambulatory Visit: Payer: Self-pay

## 2021-12-28 ENCOUNTER — Ambulatory Visit (INDEPENDENT_AMBULATORY_CARE_PROVIDER_SITE_OTHER): Payer: Self-pay | Admitting: Internal Medicine

## 2021-12-28 ENCOUNTER — Ambulatory Visit (INDEPENDENT_AMBULATORY_CARE_PROVIDER_SITE_OTHER): Payer: Self-pay | Admitting: Cardiothoracic Surgery

## 2021-12-28 ENCOUNTER — Encounter: Payer: Self-pay | Admitting: Internal Medicine

## 2021-12-28 ENCOUNTER — Encounter: Payer: Self-pay | Admitting: Cardiothoracic Surgery

## 2021-12-28 ENCOUNTER — Other Ambulatory Visit (HOSPITAL_COMMUNITY): Payer: Self-pay

## 2021-12-28 VITALS — BP 163/93 | HR 80 | Resp 18 | Ht 64.0 in | Wt 154.0 lb

## 2021-12-28 VITALS — BP 137/87 | HR 92 | Temp 98.6°F | Resp 28 | Ht 64.0 in | Wt 156.0 lb

## 2021-12-28 DIAGNOSIS — E1165 Type 2 diabetes mellitus with hyperglycemia: Secondary | ICD-10-CM

## 2021-12-28 DIAGNOSIS — G629 Polyneuropathy, unspecified: Secondary | ICD-10-CM

## 2021-12-28 DIAGNOSIS — Z5189 Encounter for other specified aftercare: Secondary | ICD-10-CM

## 2021-12-28 DIAGNOSIS — Z7984 Long term (current) use of oral hypoglycemic drugs: Secondary | ICD-10-CM

## 2021-12-28 DIAGNOSIS — M009 Pyogenic arthritis, unspecified: Secondary | ICD-10-CM

## 2021-12-28 DIAGNOSIS — E1140 Type 2 diabetes mellitus with diabetic neuropathy, unspecified: Secondary | ICD-10-CM

## 2021-12-28 DIAGNOSIS — Z4800 Encounter for change or removal of nonsurgical wound dressing: Secondary | ICD-10-CM | POA: Insufficient documentation

## 2021-12-28 DIAGNOSIS — Z23 Encounter for immunization: Secondary | ICD-10-CM

## 2021-12-28 DIAGNOSIS — Z0000 Encounter for general adult medical examination without abnormal findings: Secondary | ICD-10-CM

## 2021-12-28 MED ORDER — METFORMIN HCL 500 MG PO TABS
1000.0000 mg | ORAL_TABLET | Freq: Two times a day (BID) | ORAL | 2 refills | Status: DC
Start: 2021-12-28 — End: 2022-03-23
  Filled 2021-12-28: qty 120, 30d supply, fill #0
  Filled 2022-03-03: qty 120, 30d supply, fill #1

## 2021-12-28 MED ORDER — METFORMIN HCL 500 MG PO TABS
1000.0000 mg | ORAL_TABLET | Freq: Two times a day (BID) | ORAL | 3 refills | Status: DC
  Filled 2021-12-28: qty 60, 15d supply, fill #0

## 2021-12-28 MED ORDER — GABAPENTIN 100 MG PO CAPS
100.0000 mg | ORAL_CAPSULE | Freq: Every day | ORAL | 2 refills | Status: DC
  Filled 2021-12-28: qty 30, 30d supply, fill #0

## 2021-12-28 MED ORDER — BASAGLAR KWIKPEN 100 UNIT/ML ~~LOC~~ SOPN
40.0000 [IU] | PEN_INJECTOR | Freq: Every day | SUBCUTANEOUS | 6 refills | Status: DC
Start: 2021-12-28 — End: 2022-03-23
  Filled 2021-12-28: qty 12, 30d supply, fill #0
  Filled 2022-02-01: qty 12, 30d supply, fill #1
  Filled 2022-03-03: qty 12, 30d supply, fill #2

## 2021-12-28 NOTE — Progress Notes (Signed)
Order entered per Dr. Donata Clay.

## 2021-12-28 NOTE — Progress Notes (Signed)
HPI: Patient returns for a 2-week wound check and dressing change of the left sternoclavicular joint infection with Klebsiella. Family is packing the wound twice a day and he is taking Keflex He is not able to fill his insulin prescription due to lack of insurance and has been using his wife's  Pain is improved at the site, the left shoulder pain is improved.  He is not using pain medication.  Current Outpatient Medications  Medication Sig Dispense Refill   Accu-Chek Softclix Lancets lancets Use As Directed 100 each 0   acetaminophen (TYLENOL) 500 MG tablet Take 1 tablet (500 mg total) by mouth every 6 (six) hours as needed for moderate pain. 30 tablet 0   amLODipine (NORVASC) 5 MG tablet Take 1 tablet (5 mg total) by mouth daily. 30 tablet 11   atorvastatin (LIPITOR) 20 MG tablet Take 1 tablet (20 mg total) by mouth daily. 30 tablet 11   Blood Glucose Monitoring Suppl (ACCU-CHEK GUIDE) w/Device KIT Use As Directed 1 kit 0   cephALEXin (KEFLEX) 500 MG capsule Take 1 capsule (500 mg total) by mouth 3 (three) times daily. 21 capsule 1   diclofenac Sodium (VOLTAREN) 1 % GEL Apply 4 g topically 4 (four) times daily. (Patient taking differently: Apply 4 g topically 2 (two) times daily.) 100 g 0   glucose blood test strip Use As Directed 100 each 0   Insulin Pen Needle 32G X 4 MM MISC Use As Directed 100 each 0   lisinopril (ZESTRIL) 5 MG tablet Take 1 tablet (5 mg total) by mouth daily. 30 tablet 3   metFORMIN (GLUCOPHAGE) 500 MG tablet Take 1 tablet (500 mg total) by mouth 2 (two) times daily with a meal. 60 tablet 3   methocarbamol (ROBAXIN) 500 MG tablet Take 1 tablet (500 mg total) by mouth every 6 (six) hours as needed for muscle spasms. 20 tablet 0   temazepam (RESTORIL) 15 MG capsule Take 1 capsule (15 mg total) by mouth at bedtime as needed for sleep. 30 capsule 0   No current facility-administered medications for this visit.     Physical Exam: Blood pressure (!) 163/93, pulse 80,  resp. rate 18, height _0  (1.626 m), weight 154 lb (69.9 kg), SpO2 97 %.   I personally inspected the wound and change the dressing.  It measures approximately 1 cm deep and 3 mm wide.  I cleaned it with silver nitrate and packed a small gauze with hypochlorous acid solution.  I gave the patient and family all the supplies they needed for daily dressing changes.  Diagnostic Tests: None  Impression: Wound slowly improving but would heal faster if he had the right diabetic control and this was communicated the patient to a note Herberger who was present for the entire visit.  Plan: Return in 2 weeks for wound check.   Dahlia Byes, MD [facility] [phone]

## 2021-12-28 NOTE — Patient Instructions (Addendum)
Sr. Corts,  Fue genial verte hoy.  Hablamos sobre el control de su diabetes hoy. Me gustara que cambiara su dosis de metformina a 1000 mg por la maana y 500 mg por la noche durante una semana. Luego, cambie la dosis a 1000 mg por la maana y 1000 mg por la noche y contine con esa dosis.  Cuando venga a su prxima cita, traiga su glucmetro para que podamos ver su tendencia de [place].  Voy a revisar su nivel de vitamina B12 hoy para asegurarme de que esa no sea la causa de sus [employer]. Tambin le estoy recetando un medicamento llamado gabapentin que puede tomar por la noche para las [place].  He colocado referencias para su colonoscopa y examen de la vista. Tambin recibi su refuerzo contra el ttanos hoy.  Haga un seguimiento en 1 mes para que podamos verificar el control de su diabetes.  Mi mejor, Dr. August Saucer     Mr. Kleinsasser,  It was great to see you today.   We talked about your diabetes control today. I would like you to change your metformin dose to 1000 mg in the morning and 500 mg in the evening for one week. Then, change the dose to 1000 mg in the morning and 1000 mg in the evening and continue that dose.   When you come to your next appointment please bring your glucometer so we can look at your blood sugar trend.   I am going to check your vitamin B12 level today to make sure that is not the cause of your discomfort in your feet. I am also prescribing a medicine called gabapentin that you can take at night for the discomfort.  I have placed referrals for your colonoscopy and eye exam. You also received your tetanus booster today.  Please follow up in 1 month so that we can check your diabetes control.  My best, Dr. August Saucer

## 2021-12-28 NOTE — Progress Notes (Unsigned)
   CC: diabetes follow-up  HPI:  Benjamin Thompson is a 51 y.o. person with past medical history as detailed below who presents today for follow-up of his diabetes. Please see problem based charting for detailed assessment and plan.  Past Medical History:  Diagnosis Date   Diabetes mellitus without complication (HCC)    Hypertension    Review of Systems:  Negative unless otherwise stated.  Physical Exam:  Vitals:   12/28/21 1554  BP: 137/87  Pulse: 92  Resp: (!) 28  Temp: 98.6 F (37 C)  TempSrc: Oral  SpO2: 97%  Weight: 156 lb (70.8 kg)  Height: 5\' 4"  (1.626 m)   Constitutional:Well appearing gentleman in no acute distress. Cardio:Regular rate and rhythm. No murmurs, rubs, or gallops. Pulm:Clear to auscultation bilaterally. for extremity edema. Sensation intact to bilateral LE. DP pulses normal. Skin:Warm and dry. Neuro:Alert and oriented x3. No focal deficit noted. Psych:Pleasant mood and affect.  Assessment & Plan:   See Encounters Tab for problem based charting.  Uncontrolled type 2 diabetes mellitus with hyperglycemia, without long-term current use of insulin [facility]) Patient presents for follow-up of his diabetes. He checks fasting BG in the morning and notes low over the last 1 month of 74 and high of 174. He denies symptomatic hypoglycemia. Current regimen of insulin 40 units daily and metformin 500 mg BID.  Assessment:Patient not due for HbA1c recheck yet as last was in June. He still has room for improvement of glycemic control based on reported fasting BG. Plan: Continue Semglee 40 units nightly and increase metformin: 1000 mg in morning and 500 mg in evening for 7 days, then increase to 1000 mg BID. Will see him in 1 month to assess for further improvement of glycemic control.  Neuropathy Patient reports discomfort in bilateral feet that has become more bothersome over the last several weeks that is a pulsing and swollen sensation with tingling.  This affects bilateral feet on plantar and dorsal surfaces. The discomfort does not improve with moving the limbs or ambulating. There is no time of day where the discomfort is better or worse. He has no history of anemia. Assessment:Given long history of DM and lack of glycemic control he most likely is experiencing diabetic neuropathy. Plan:Will check vitamin B12 today. Gabapentin 100 mg daily at bedtime started with room to uptitrate as needed for symptom control.  Healthcare maintenance Patient is due for colonoscopy and diabetic eye exam, however he is uninsured and has not completed July. This packet was given to him at the end of this visit to complete so that, once approved, we can place the necessary referrals.  Patient discussed with Dr.  [employer]

## 2021-12-29 ENCOUNTER — Ambulatory Visit (INDEPENDENT_AMBULATORY_CARE_PROVIDER_SITE_OTHER): Payer: Self-pay | Admitting: Internal Medicine

## 2021-12-29 ENCOUNTER — Other Ambulatory Visit: Payer: Self-pay

## 2021-12-29 ENCOUNTER — Encounter: Payer: Self-pay | Admitting: Internal Medicine

## 2021-12-29 DIAGNOSIS — M00812 Arthritis due to other bacteria, left shoulder: Secondary | ICD-10-CM

## 2021-12-29 DIAGNOSIS — M009 Pyogenic arthritis, unspecified: Secondary | ICD-10-CM

## 2021-12-29 LAB — VITAMIN B12: Vitamin B-12: 475 pg/mL (ref 232–1245)

## 2021-12-29 NOTE — Progress Notes (Signed)
[facility]  Patient Active Problem List   Diagnosis Date Noted   Septic arthritis of sternoclavicular joint, left ([place]) 11/04/2021    Priority: High   Dressing change 12/28/2021   Visit for wound care 12/03/2021   Back pain 11/25/2021   Encounter for post surgical wound check 11/23/2021   Liver abscess 10/19/2021   Pneumonia of both lungs due to Klebsiella pneumoniae ([place]) 10/15/2021   Essential hypertension 10/15/2021   Mixed hyperlipidemia 10/15/2021   Lactic acidosis 10/15/2021   Bacteremia due to Klebsiella pneumoniae 10/14/2021   TINEA CORPORIS 08/22/2008   Uncontrolled type 2 diabetes mellitus with hyperglycemia, without long-term current use of insulin ([place]) 03/07/2008   MICROSCOPIC HEMATURIA 03/07/2008   GLYCOSURIA 03/07/2008   GASTROESOPHAGEAL REFLUX DISEASE 01/25/2008   CONSTIPATION 01/25/2008    Patient's Medications  New Prescriptions   No medications on file  Previous Medications   ACCU-CHEK SOFTCLIX LANCETS LANCETS    Use As Directed   ACETAMINOPHEN (TYLENOL) 500 MG TABLET    Take 1 tablet (500 mg total) by mouth every 6 (six) hours as needed for moderate pain.   AMLODIPINE (NORVASC) 5 MG TABLET    Take 1 tablet (5 mg total) by mouth daily.   ATORVASTATIN (LIPITOR) 20 MG TABLET    Take 1 tablet (20 mg total) by mouth daily.   BLOOD GLUCOSE MONITORING SUPPL (ACCU-CHEK GUIDE) W/DEVICE KIT    Use As Directed   DICLOFENAC SODIUM (VOLTAREN) 1 % GEL    Apply 4 g topically 4 (four) times daily.   GABAPENTIN (NEURONTIN) 100 MG CAPSULE    Take 1 capsule (100 mg total) by mouth at bedtime.   GLUCOSE BLOOD TEST STRIP    Use As Directed   INSULIN GLARGINE (BASAGLAR KWIKPEN) 100 UNIT/ML    Inject 40 Units into the skin daily.   INSULIN PEN NEEDLE 32G X 4 MM MISC    Use As Directed   LISINOPRIL (ZESTRIL) 5 MG TABLET    Take 1 tablet (5 mg total) by mouth daily.   METFORMIN (GLUCOPHAGE) 500 MG TABLET    Take 2 tablets (1,000 mg total) by  mouth 2 (two) times daily with a meal.   METHOCARBAMOL (ROBAXIN) 500 MG TABLET    Take 1 tablet (500 mg total) by mouth every 6 (six) hours as needed for muscle spasms.   TEMAZEPAM (RESTORIL) 15 MG CAPSULE    Take 1 capsule (15 mg total) by mouth at bedtime as needed for sleep.  Modified Medications   No medications on file  Discontinued Medications   CEPHALEXIN (KEFLEX) 500 MG CAPSULE    Take 1 capsule (500 mg total) by mouth 3 (three) times daily.    Subjective: Benjamin Thompson is in for a routine follow-up visit.  He has recently diagnosed diabetes.  He was hospitalized from May 31 to June 6.  He had a 2 cm liver abscess drained which grew Klebsiella.  Blood cultures were also positive for Klebsiella.  Percutaneous drainage catheter was not required.  He was discharged on cefadroxil and metronidazole with a plan to continue that through July 2.  I asked him several different times if he was still taking his antibiotics when he was readmitted to the hospital.  Initially he indicated that he was not but then that he was I am not sure that he understood my question.  However, he developed sudden pain and swelling over his left sternoclavicular joint and was readmitted  on 11/04/2021.  Blood cultures were negative.  He underwent incision and drainage of the left sternoclavicular joint on 11/06/2021.  Cultures grew Klebsiella again which remains susceptible to first generation cephalosporins.  He was discharged on 11/16/2021 on oral trimethoprim/sulfamethoxazole.  He says he is taking it twice daily with food.  He denies having any problems tolerating trimethoprim/sulfamethoxazole.  He has had some intermittent nausea.  At the time of his last visit with me he indicated that he was tolerating trimethoprim/sulfamethoxazole.  However it sounds like the nausea continued so Dr. Prescott Gum changed him to cephalexin last month.  His nausea resolved.  He saw Dr. Prescott Gum again yesterday who noted that his wound was healing  slowly.  He was finally able to pick up his insulin at his pharmacy yesterday.  He tells me that his wife will be administering it for him.  He says that he was also told that he needs to be more strict with his diet.    Review of Systems: Review of Systems  Constitutional:  Negative for chills, diaphoresis and fever.  Gastrointestinal:  Negative for abdominal pain, diarrhea, nausea and vomiting.  Musculoskeletal:  Positive for joint pain.    Past Medical History:  Diagnosis Date   Diabetes mellitus without complication ([place])    Hypertension     Social History   Tobacco Use   Smoking status: Never  Vaping Use   Vaping Use: Never used  Substance Use Topics   Alcohol use: Not Currently    Comment: little   Drug use: No    Family History  Problem Relation Age of Onset   Healthy Mother    Hypertension Brother    Diabetes Brother    Heart disease Neg Hx     No Known Allergies  Objective: Vitals:   12/29/21 1556  BP: (!) 153/90  Pulse: 85  Temp: 98.4 F (36.9 C)  TempSrc: Oral  SpO2: 96%  Weight: 157 lb (71.2 kg)   Body mass index is 26.95 kg/m.  Physical Exam Constitutional:      Comments: He is pleasant and in good spirits.  He was examined with the aid of the Spanish interpreter.  Cardiovascular:     Rate and Rhythm: Normal rate and regular rhythm.     Heart sounds: No murmur heard. Pulmonary:     Effort: Pulmonary effort is normal.     Breath sounds: Normal breath sounds.  Abdominal:     Palpations: Abdomen is soft. There is no mass.     Tenderness: There is no abdominal tenderness. There is no right CVA tenderness or left CVA tenderness.  Musculoskeletal:     Comments: His left sternoclavicular joint wound is clean and dry.  Psychiatric:        Mood and Affect: Mood normal.     Lab Results Sed Rate  Date Value  12/15/2021 38 mm/h (H)  11/26/2021 45 mm/h (H)  11/04/2021 69 mm/hr (H)   CRP  Date Value  12/15/2021 3.2 mg/L  11/26/2021 2.9  mg/L  11/04/2021 1.6 mg/dL (H)      Problem List Items Addressed This Visit       High   Septic arthritis of sternoclavicular joint, left ([facility])    He has now completed 8 weeks of postoperative antibiotics for his Klebsiella left sternoclavicular joint infection.  I am quite hopeful that his infection has been cured.  I instructed him to stop cephalexin now.  He will follow-up in 6 weeks.  Michel Bickers, MD [facility] [phone] pager   [phone] cell 12/29/2021, 4:10 PM

## 2021-12-29 NOTE — Assessment & Plan Note (Signed)
He has now completed 8 weeks of postoperative antibiotics for his Klebsiella left sternoclavicular joint infection.  I am quite hopeful that his infection has been cured.  I instructed him to stop cephalexin now.  He will follow-up in 6 weeks.

## 2021-12-30 DIAGNOSIS — E1142 Type 2 diabetes mellitus with diabetic polyneuropathy: Secondary | ICD-10-CM | POA: Insufficient documentation

## 2021-12-30 DIAGNOSIS — G629 Polyneuropathy, unspecified: Secondary | ICD-10-CM | POA: Insufficient documentation

## 2021-12-30 DIAGNOSIS — Z0000 Encounter for general adult medical examination without abnormal findings: Secondary | ICD-10-CM | POA: Insufficient documentation

## 2021-12-30 NOTE — Assessment & Plan Note (Signed)
Patient presents for follow-up of his diabetes. He checks fasting BG in the morning and notes low over the last 1 month of 74 and high of 174. He denies symptomatic hypoglycemia. Current regimen of insulin 40 units daily and metformin 500 mg BID.  Assessment:Patient not due for HbA1c recheck yet as last was in June. He still has room for improvement of glycemic control based on reported fasting BG. Plan: Continue Semglee 40 units nightly and increase metformin: 1000 mg in morning and 500 mg in evening for 7 days, then increase to 1000 mg BID. Will see him in 1 month to assess for further improvement of glycemic control.

## 2021-12-30 NOTE — Assessment & Plan Note (Signed)
Patient reports discomfort in bilateral feet that has become more bothersome over the last several weeks that is a pulsing and swollen sensation with tingling. This affects bilateral feet on plantar and dorsal surfaces. The discomfort does not improve with moving the limbs or ambulating. There is no time of day where the discomfort is better or worse. He has no history of anemia. Assessment:Given long history of DM and lack of glycemic control he most likely is experiencing diabetic neuropathy. Plan:Will check vitamin B12 today. Gabapentin 100 mg daily at bedtime started with room to uptitrate as needed for symptom control.

## 2021-12-30 NOTE — Assessment & Plan Note (Signed)
Patient is due for colonoscopy and diabetic eye exam, however he is uninsured and has not completed [employer]. This packet was given to him at the end of this visit to complete so that, once approved, we can place the necessary referrals.

## 2021-12-31 ENCOUNTER — Encounter: Payer: Self-pay | Admitting: Internal Medicine

## 2021-12-31 NOTE — Progress Notes (Signed)
Internal Medicine Clinic Attending ? ?Case discussed with Dr. Dean  At the time of the visit.  We reviewed the resident?s history and exam and pertinent patient test results.  I agree with the assessment, diagnosis, and plan of care documented in the resident?s note.  ?

## 2022-01-11 ENCOUNTER — Ambulatory Visit: Payer: Self-pay | Admitting: Cardiothoracic Surgery

## 2022-01-12 ENCOUNTER — Encounter: Payer: Self-pay | Admitting: Cardiothoracic Surgery

## 2022-01-12 ENCOUNTER — Ambulatory Visit (INDEPENDENT_AMBULATORY_CARE_PROVIDER_SITE_OTHER): Payer: Self-pay | Admitting: Cardiothoracic Surgery

## 2022-01-12 VITALS — BP 144/92 | HR 71 | Resp 18 | Ht 64.0 in | Wt 157.0 lb

## 2022-01-12 DIAGNOSIS — Z5189 Encounter for other specified aftercare: Secondary | ICD-10-CM

## 2022-01-12 DIAGNOSIS — Z4800 Encounter for change or removal of nonsurgical wound dressing: Secondary | ICD-10-CM

## 2022-01-12 NOTE — Progress Notes (Signed)
HPI:  The patient returns for wound check and dressing change of the left sternoclavicular joint infection.  Since the last visit the patient was seen in factious disease clinic and antibiotics have been stopped.  Since the last visit the patient has started taking insulin and his blood sugars at home range approximately 1 20-1 40.  His left shoulder pain is resolved. No fever or excessive drainage from wound. The wife is performing daily dressing changes. Current Outpatient Medications  Medication Sig Dispense Refill   Accu-Chek Softclix Lancets lancets Use As Directed 100 each 0   acetaminophen (TYLENOL) 500 MG tablet Take 1 tablet (500 mg total) by mouth every 6 (six) hours as needed for moderate pain. 30 tablet 0   amLODipine (NORVASC) 5 MG tablet Take 1 tablet (5 mg total) by mouth daily. 30 tablet 11   atorvastatin (LIPITOR) 20 MG tablet Take 1 tablet (20 mg total) by mouth daily. 30 tablet 11   Blood Glucose Monitoring Suppl (ACCU-CHEK GUIDE) w/Device KIT Use As Directed 1 kit 0   gabapentin (NEURONTIN) 100 MG capsule Take 1 capsule (100 mg total) by mouth at bedtime. 30 capsule 2   glucose blood test strip Use As Directed 100 each 0   Insulin Glargine (BASAGLAR KWIKPEN) 100 UNIT/ML Inject 40 Units into the skin daily. 12 mL 6   Insulin Pen Needle 32G X 4 MM MISC Use As Directed 100 each 0   lisinopril (ZESTRIL) 5 MG tablet Take 1 tablet (5 mg total) by mouth daily. 30 tablet 3   metFORMIN (GLUCOPHAGE) 500 MG tablet Take 2 tablets (1,000 mg total) by mouth 2 (two) times daily with a meal. 120 tablet 2   No current facility-administered medications for this visit.     Physical Exam: Blood pressure (!) 144/92, pulse 71, resp. rate 18, height _0  (1.626 m), weight 157 lb (71.2 kg), SpO2 98 %.  Patient in no distress Lungs clear No notable tenderness of chest wall Wound is a depth of the cotton tip of a Q-tip-1.5 to 2 cm through the dermis. I did a cauterization with silver nitrate  stick and applied a Neosporin dressing. There is no drainage.  Diagnostic Tests: None  Impression: Left sternoclavicular joint wound is almost healed.  Now that he is taking insulin it should be healed over in about 2 weeks.  The wife will continue daily dressing changes with cleaning out the wound with a Q-tip and apply Neosporin dab  and a Band-Aid. Plan: Return for final surgical visit in 3 weeks.   Dahlia Byes, MD [facility] [phone]

## 2022-02-01 ENCOUNTER — Encounter: Payer: Self-pay | Admitting: Cardiothoracic Surgery

## 2022-02-01 ENCOUNTER — Encounter: Payer: Self-pay | Admitting: Student

## 2022-02-01 ENCOUNTER — Other Ambulatory Visit (HOSPITAL_COMMUNITY): Payer: Self-pay

## 2022-02-01 ENCOUNTER — Ambulatory Visit (INDEPENDENT_AMBULATORY_CARE_PROVIDER_SITE_OTHER): Payer: Self-pay | Admitting: Student

## 2022-02-01 ENCOUNTER — Other Ambulatory Visit: Payer: Self-pay

## 2022-02-01 ENCOUNTER — Ambulatory Visit (INDEPENDENT_AMBULATORY_CARE_PROVIDER_SITE_OTHER): Payer: Self-pay | Admitting: Cardiothoracic Surgery

## 2022-02-01 VITALS — BP 149/96 | HR 80 | Resp 20 | Ht 63.0 in | Wt 160.0 lb

## 2022-02-01 VITALS — BP 136/92 | HR 96 | Temp 98.3°F | Ht 63.0 in | Wt 159.4 lb

## 2022-02-01 DIAGNOSIS — Z4800 Encounter for change or removal of nonsurgical wound dressing: Secondary | ICD-10-CM

## 2022-02-01 DIAGNOSIS — Z5189 Encounter for other specified aftercare: Secondary | ICD-10-CM

## 2022-02-01 DIAGNOSIS — E1142 Type 2 diabetes mellitus with diabetic polyneuropathy: Secondary | ICD-10-CM

## 2022-02-01 DIAGNOSIS — G629 Polyneuropathy, unspecified: Secondary | ICD-10-CM

## 2022-02-01 DIAGNOSIS — E1165 Type 2 diabetes mellitus with hyperglycemia: Secondary | ICD-10-CM

## 2022-02-01 DIAGNOSIS — I10 Essential (primary) hypertension: Secondary | ICD-10-CM

## 2022-02-01 LAB — POCT GLYCOSYLATED HEMOGLOBIN (HGB A1C): Hemoglobin A1C: 7.9 % — AB (ref 4.0–5.6)

## 2022-02-01 LAB — GLUCOSE, CAPILLARY: Glucose-Capillary: 240 mg/dL — ABNORMAL HIGH (ref 70–99)

## 2022-02-01 MED ORDER — GABAPENTIN 100 MG PO CAPS
300.0000 mg | ORAL_CAPSULE | Freq: Every day | ORAL | 0 refills | Status: DC
  Filled 2022-02-01: qty 90, 30d supply, fill #0

## 2022-02-01 MED ORDER — GABAPENTIN 100 MG PO CAPS
300.0000 mg | ORAL_CAPSULE | Freq: Every day | ORAL | 0 refills | Status: DC
  Filled 2022-02-01: qty 30, 10d supply, fill #0

## 2022-02-01 MED ORDER — MICROLET LANCETS MISC
3 refills | Status: DC
  Filled 2022-02-01: qty 100, 100d supply, fill #0

## 2022-02-01 MED ORDER — GLUCOSE BLOOD VI STRP
ORAL_STRIP | 3 refills | Status: DC
  Filled 2022-02-01 (×2): qty 50, 50d supply, fill #0
  Filled 2022-10-12: qty 100, 100d supply, fill #1

## 2022-02-01 NOTE — Progress Notes (Signed)
HPI: The patient returns for final wound check after debridement and packing of left sternoclavicular joint infection with Klebsiella pneumonia.  Patient has finished antibiotics and been signed off by ID.  On exam the skin is now completely healed and there is only slight discoloration but no erythema tenderness or edema.  Patient has been compliant with his insulin and diabetic medication to help expedite wound healing.  Patient is now ready to return to work and was given a note for documentation.  No further surgical follow-up is needed and good skin care was discussed with the patient to the hospital provided interpreter. Patient was also recommended to be compliant with his blood pressure medication and statin.  Current Outpatient Medications  Medication Sig Dispense Refill   Blood Glucose Monitoring Suppl (ACCU-CHEK GUIDE) w/Device KIT Use As Directed 1 kit 0   gabapentin (NEURONTIN) 100 MG capsule Take 3 capsules (300 mg total) by mouth at bedtime. 90 capsule 0   glucose blood test strip Use test strip to check sugar every morning. 100 each 3   Insulin Glargine (BASAGLAR KWIKPEN) 100 UNIT/ML Inject 40 Units into the skin daily. 12 mL 6   Insulin Pen Needle 32G X 4 MM MISC Use As Directed 100 each 0   lisinopril (ZESTRIL) 5 MG tablet Take 1 tablet (5 mg total) by mouth daily. 30 tablet 3   metFORMIN (GLUCOPHAGE) 500 MG tablet Take 2 tablets (1,000 mg total) by mouth 2 (two) times daily with a meal. 120 tablet 2   Microlet Lancets MISC Use to check sugar every morning 100 each 3   acetaminophen (TYLENOL) 500 MG tablet Take 1 tablet (500 mg total) by mouth every 6 (six) hours as needed for moderate pain. (Patient not taking: Reported on 02/01/2022) 30 tablet 0   amLODipine (NORVASC) 5 MG tablet Take 1 tablet (5 mg total) by mouth daily. (Patient not taking: Reported on 02/01/2022) 30 tablet 11   atorvastatin (LIPITOR) 20 MG tablet Take 1 tablet (20 mg total) by mouth daily. (Patient not taking:  Reported on 02/01/2022) 30 tablet 11   No current facility-administered medications for this visit.     Physical Exam: Vitals:   02/01/22 1603 02/01/22 1610  BP: (!) 160/98 (!) 149/96  Pulse: 80   Resp: 20   SpO2: 98%   Alert and comfortable Lungs clear Heart rate regular without murmur Incision over the left sternoclavicular joint is healed, no fluctuance or subcutaneous space. Normal left hand grip, normal left wrist pulse  Diagnostic Tests: None  Impression: Healed surgical wound following debridement of left sternoclavicular joint infection  Plan: Normal daily skin care over site Continue good diabetic care Return as needed   Dahlia Byes, MD [facility] [phone]

## 2022-02-01 NOTE — Patient Instructions (Addendum)
[name], it was a pleasure seeing you today!  Today we discussed: - Diabetes: Tus azcares en el glucmetro se ven geniales! Estoy esperando ver su A1c. Esta es una prueba para controlar su diabetes. Verificaremos esto cada 3 meses y haremos los ajustes de medicacin necesarios segn este nmero. Lo llamar maana con los resultados y podremos realizar los cambios necesarios en los medicamentos.  - He aumentado tu gabapentina para el dolor de pies. Ahora deberas tomar 300 mg al da por la noche  - Diabetes: Your sugars on the glucometer look great! I am waiting to see your A1c. This is a test to check on your diabetes. We will check this every 3 months and make medication adjustments as needed based on this number. I will call you tomorrow with the results and we can make any medication changes necessary.  - I have increased your gabapentin for your foot pain. You should now take 300mg  daily at night.  I have ordered the following labs today:   Lab Orders         Microalbumin / Creatinine Urine Ratio         Glucose, capillary         POC Hbg A1C      Follow-up: 3 months   Please make sure to arrive 15 minutes prior to your next appointment. If you arrive late, you may be asked to reschedule.   We look forward to seeing you next time. Please call our clinic at [phone] if you have any questions or concerns. The best time to call is Monday-Friday from 9am-4pm, but there is someone available 24/7. If after hours or the weekend, call the main hospital number and ask for the Internal Medicine Resident On-Call. If you need medication refills, please notify your pharmacy one week in advance and they will send [place] a request.  Thank you for letting us take part in your care. Wishing you the best!  Thank you, Sanjuan Dame, MD

## 2022-02-02 LAB — MICROALBUMIN / CREATININE URINE RATIO
Creatinine, Urine: 224.6 mg/dL
Microalb/Creat Ratio: 17 mg/g{creat} (ref 0–29)
Microalbumin, Urine: 38.6 ug/mL

## 2022-02-03 NOTE — Assessment & Plan Note (Addendum)
Mr. Elahi is presenting today for diabetes. He brought his glucometer, which was downloaded in the office. Congratulated Mr. Gross, as his sugars were 100% in target range. Mentions feeling better overall, denies any symptoms of hypoglycemia. Has been compliant with 40 units of long-acting insulin and metformin 1000mg  twice daily.   Unfortunately, his A1c did not return during his appointment. Given his recorded sugars, I expect his A1c to vastly improve. Will follow-up with him after this results.  - Follow-up A1c - Test strips, lancets re-ordered today - Microalbumin/creatinine ratio today - Foot exam completed today - Consider retinal camera in office at next visit  ADDENDUM: A1c 7.9%, vastly improved from 13.2%. discussed with Mr. Drapeau, will plan to initiate SGLT2i today. Hopefully will be able to de-escalate off of insulin soon.   - Continue glargine 40 units daily - Continue metformin 1000mg  twice daily - Start empagliflozin 10mg  daily - Return to clinic in three months

## 2022-02-03 NOTE — Assessment & Plan Note (Signed)
BP Readings from Last 3 Encounters:  02/01/22 (!) 149/96  02/01/22 (!) 136/92  01/12/22 (!) 144/92   BP mildly elevated today. We did not discuss this topic directly, but would consider increasing ACEi at next visit for better control.

## 2022-02-03 NOTE — Assessment & Plan Note (Signed)
Today Benjamin Thompson reports his feet discomfort has not improved since starting gabapentin. Does report mainly tingling, pulsing sensation in bilateral feet. No claudication symptoms. We will increase his gabapentin today, can consider dosing throughout the day if this does not help.  - Increase gabapentin 300mg  nightly

## 2022-02-03 NOTE — Progress Notes (Signed)
   CC: diabetes follow-up  HPI:  Benjamin Thompson is a 51 y.o. person with type II diabetes mellitus complicated by peripheral neuropathy, hypertension, GERD presenting to [facility] for diabetes follow-up.  Please see problem-based list for further details, assessments, and plans.  Past Medical History:  Diagnosis Date   Diabetes mellitus without complication ([place])    Hypertension    Review of Systems:  As per HPI  Physical Exam:  Vitals:   02/01/22 1348  BP: (!) 136/92  Pulse: 96  Temp: 98.3 F (36.8 C)  TempSrc: Oral  SpO2: 100%  Weight: 159 lb 6.4 oz (72.3 kg)  Height: 5\' 3"  (1.6 m)   General: Well-appearing, resting comfortably in chair in no acute distress CV: Regular rate, rhythm. No murmurs appreciated. DP pulses 2+ bilaterally. Pulm: Normal work of breathing on room air. Clear to ausculation bilaterally.   Skin: Warm, dry. No rashes or lesions. Neuro: Awake, alert, conversing appropriately. Grossly non-focal.   Assessment & Plan:   Essential hypertension BP Readings from Last 3 Encounters:  02/01/22 (!) 149/96  02/01/22 (!) 136/92  01/12/22 (!) 144/92   BP mildly elevated today. We did not discuss this topic directly, but would consider increasing ACEi at next visit for better control.  Diabetic peripheral neuropathy [facility]) Today Benjamin Thompson reports his feet discomfort has not improved since starting gabapentin. Does report mainly tingling, pulsing sensation in bilateral feet. No claudication symptoms. We will increase his gabapentin today, can consider dosing throughout the day if this does not help.  - Increase gabapentin 300mg  nightly  Type II diabetes mellitus [facility]) Benjamin Thompson is presenting today for diabetes. He brought his glucometer, which was downloaded in the office. Congratulated Benjamin Thompson, as his sugars were 100% in target range. Mentions feeling better overall, denies any symptoms of hypoglycemia. Has been compliant with 40 units of long-acting insulin  and metformin 1000mg  twice daily.   Unfortunately, his A1c did not return during his appointment. Given his recorded sugars, I expect his A1c to vastly improve. Will follow-up with him after this results.  - Follow-up A1c - Microalbumin/creatinine ratio today - Foot exam completed today - Consider retinal camera in office at next visit  Patient discussed with Dr. Andrey Spearman, MD Internal Medicine PGY-3 Pager: [phone]

## 2022-02-08 ENCOUNTER — Other Ambulatory Visit (HOSPITAL_COMMUNITY): Payer: Self-pay

## 2022-02-08 MED ORDER — EMPAGLIFLOZIN 10 MG PO TABS
10.0000 mg | ORAL_TABLET | Freq: Every day | ORAL | 0 refills | Status: DC
  Filled 2022-02-08: qty 30, 30d supply, fill #0

## 2022-02-08 NOTE — Addendum Note (Signed)
Addended bySanjuan Dame on: 02/08/2022 04:54 PM   Modules accepted: Orders

## 2022-02-09 ENCOUNTER — Other Ambulatory Visit: Payer: Self-pay

## 2022-02-09 ENCOUNTER — Ambulatory Visit (INDEPENDENT_AMBULATORY_CARE_PROVIDER_SITE_OTHER): Payer: Self-pay | Admitting: Internal Medicine

## 2022-02-09 ENCOUNTER — Encounter: Payer: Self-pay | Admitting: Internal Medicine

## 2022-02-09 DIAGNOSIS — K750 Abscess of liver: Secondary | ICD-10-CM

## 2022-02-09 DIAGNOSIS — M009 Pyogenic arthritis, unspecified: Secondary | ICD-10-CM

## 2022-02-09 DIAGNOSIS — M00012 Staphylococcal arthritis, left shoulder: Secondary | ICD-10-CM

## 2022-02-09 NOTE — Progress Notes (Signed)
Internal Medicine Clinic Attending ? ?Case discussed with Dr. Braswell  at the time of the visit.  We reviewed the resident?s history and exam and pertinent patient test results.  I agree with the assessment, diagnosis, and plan of care documented in the resident?s note.  ?

## 2022-02-09 NOTE — Assessment & Plan Note (Signed)
His recent Klebsiella bacteremia and small liver abscess have been cured

## 2022-02-09 NOTE — Progress Notes (Signed)
[facility]  Patient Active Problem List   Diagnosis Date Noted   Septic arthritis of sternoclavicular joint, left ([place]) 11/04/2021    Priority: High   Abscess re-check 02/01/2022   Dressing change 01/12/2022   Diabetic peripheral neuropathy ([place]) 12/30/2021   Healthcare maintenance 12/30/2021   Liver abscess 10/19/2021   Essential hypertension 10/15/2021   Mixed hyperlipidemia 10/15/2021   Type II diabetes mellitus ([place]) 03/07/2008   MICROSCOPIC HEMATURIA 03/07/2008   GERD (gastroesophageal reflux disease) 01/25/2008    Patient's Medications  New Prescriptions   No medications on file  Previous Medications   ACETAMINOPHEN (TYLENOL) 500 MG TABLET    Take 1 tablet (500 mg total) by mouth every 6 (six) hours as needed for moderate pain.   AMLODIPINE (NORVASC) 5 MG TABLET    Take 1 tablet (5 mg total) by mouth daily.   ATORVASTATIN (LIPITOR) 20 MG TABLET    Take 1 tablet (20 mg total) by mouth daily.   BLOOD GLUCOSE MONITORING SUPPL (ACCU-CHEK GUIDE) W/DEVICE KIT    Use As Directed   EMPAGLIFLOZIN (JARDIANCE) 10 MG TABS TABLET    Take 1 tablet (10 mg total) by mouth daily.   GABAPENTIN (NEURONTIN) 100 MG CAPSULE    Take 3 capsules (300 mg total) by mouth at bedtime.   GLUCOSE BLOOD TEST STRIP    Use test strip to check sugar every morning.   INSULIN GLARGINE (BASAGLAR KWIKPEN) 100 UNIT/ML    Inject 40 Units into the skin daily.   INSULIN PEN NEEDLE 32G X 4 MM MISC    Use As Directed   LISINOPRIL (ZESTRIL) 5 MG TABLET    Take 1 tablet (5 mg total) by mouth daily.   METFORMIN (GLUCOPHAGE) 500 MG TABLET    Take 2 tablets (1,000 mg total) by mouth 2 (two) times daily with a meal.   MICROLET LANCETS MISC    Use to check sugar every morning  Modified Medications   No medications on file  Discontinued Medications   No medications on file    Subjective: Mr. Bransfield is in for a routine follow-up visit.  He has recently diagnosed diabetes.  He was  hospitalized from May 31 to June 6.  He had a 2 cm liver abscess drained which grew Klebsiella.  Blood cultures were also positive for Klebsiella.  Percutaneous drainage catheter was not required.  He was discharged on cefadroxil and metronidazole with a plan to continue that through July 2.    However, he developed sudden pain and swelling over his left sternoclavicular joint and was readmitted on 11/04/2021.  Blood cultures were negative.  He underwent incision and drainage of the left sternoclavicular joint on 11/06/2021.  Cultures grew Klebsiella again which remained susceptible to first generation cephalosporins.  He was discharged on 11/16/2021 on oral trimethoprim/sulfamethoxazole.  He says he is taking it twice daily with food.  He denies having any problems tolerating trimethoprim/sulfamethoxazole.  He has had some intermittent nausea.  At the time of his last visit with me he indicated that he was tolerating trimethoprim/sulfamethoxazole.  However it sounds like the nausea continued so Dr. Prescott Gum changed him to cephalexin.  He completed 8 weeks of postoperative therapy on 12/29/2021.  I was finally able to get started on insulin which she has been taking.  His hemoglobin A1c improved from 13.2 to 7.9.   Review of Systems: Review of Systems  Constitutional:  Negative for chills, diaphoresis and  fever.  Gastrointestinal:  Negative for abdominal pain, diarrhea, nausea and vomiting.  Musculoskeletal:  Negative for joint pain.    Past Medical History:  Diagnosis Date   Diabetes mellitus without complication ([place])    Hypertension     Social History   Tobacco Use   Smoking status: Never  Vaping Use   Vaping Use: Never used  Substance Use Topics   Alcohol use: Not Currently    Comment: little   Drug use: No    Family History  Problem Relation Age of Onset   Healthy Mother    Hypertension Brother    Diabetes Brother    Heart disease Neg Hx     No Known  Allergies  Objective: Vitals:   02/09/22 1440  BP: (!) 146/93  Pulse: 80  Resp: 16  SpO2: 97%  Weight: 161 lb 6.4 oz (73.2 kg)  Height: 5' 3"  (1.6 m)   Body mass index is 28.59 kg/m.  Physical Exam Constitutional:      Comments: He is in good spirits.  He was examined with the aid of the Spanish interpreter.  Cardiovascular:     Rate and Rhythm: Normal rate and regular rhythm.     Heart sounds: No murmur heard. Pulmonary:     Effort: Pulmonary effort is normal.     Breath sounds: Normal breath sounds.  Abdominal:     Palpations: Abdomen is soft. There is no mass.     Tenderness: There is no abdominal tenderness. There is no right CVA tenderness or left CVA tenderness.  Musculoskeletal:     Comments: His left sternoclavicular joint wound is closed.  There is a small central scab but no drainage.  The scar is somewhat contracted.  Psychiatric:        Mood and Affect: Mood normal.     Lab Results Sed Rate  Date Value  12/15/2021 38 mm/h (H)  11/26/2021 45 mm/h (H)  11/04/2021 69 mm/hr (H)   CRP  Date Value  12/15/2021 3.2 mg/L  11/26/2021 2.9 mg/L  11/04/2021 1.6 mg/dL (H)      Problem List Items Addressed This Visit       High   Septic arthritis of sternoclavicular joint, left (HCC)    I believe that his sternoclavicular joint septic arthritis has been cured.  He will follow-up here as needed.        Unprioritized   Liver abscess    His recent Klebsiella bacteremia and small liver abscess have been cured      Michel Bickers, MD [facility] [place] [phone] pager   [phone] cell 02/09/2022, 3:03 PM

## 2022-02-09 NOTE — Assessment & Plan Note (Signed)
I believe that his sternoclavicular joint septic arthritis has been cured.  He will follow-up here as needed.

## 2022-02-16 ENCOUNTER — Other Ambulatory Visit (HOSPITAL_COMMUNITY): Payer: Self-pay

## 2022-03-03 ENCOUNTER — Other Ambulatory Visit: Payer: Self-pay

## 2022-03-23 ENCOUNTER — Ambulatory Visit: Payer: Self-pay | Attending: Family Medicine | Admitting: Family Medicine

## 2022-03-23 ENCOUNTER — Other Ambulatory Visit: Payer: Self-pay

## 2022-03-23 ENCOUNTER — Encounter: Payer: Self-pay | Admitting: Family Medicine

## 2022-03-23 VITALS — BP 159/97 | HR 78 | Temp 98.2°F | Ht 64.0 in | Wt 163.2 lb

## 2022-03-23 DIAGNOSIS — G629 Polyneuropathy, unspecified: Secondary | ICD-10-CM

## 2022-03-23 DIAGNOSIS — E1165 Type 2 diabetes mellitus with hyperglycemia: Secondary | ICD-10-CM

## 2022-03-23 DIAGNOSIS — N528 Other male erectile dysfunction: Secondary | ICD-10-CM

## 2022-03-23 DIAGNOSIS — I10 Essential (primary) hypertension: Secondary | ICD-10-CM

## 2022-03-23 DIAGNOSIS — Z23 Encounter for immunization: Secondary | ICD-10-CM

## 2022-03-23 MED ORDER — EMPAGLIFLOZIN 10 MG PO TABS
10.0000 mg | ORAL_TABLET | Freq: Every day | ORAL | 1 refills | Status: DC
  Filled 2022-03-23: qty 30, 30d supply, fill #0
  Filled 2022-10-12: qty 30, 30d supply, fill #1

## 2022-03-23 MED ORDER — SILDENAFIL CITRATE 50 MG PO TABS
50.0000 mg | ORAL_TABLET | Freq: Every day | ORAL | 0 refills | Status: DC | PRN
  Filled 2022-03-23: qty 10, 30d supply, fill #0

## 2022-03-23 MED ORDER — METFORMIN HCL 500 MG PO TABS
1000.0000 mg | ORAL_TABLET | Freq: Two times a day (BID) | ORAL | 2 refills | Status: DC
  Filled 2022-03-23: qty 120, 30d supply, fill #0
  Filled 2022-06-18: qty 360, 90d supply, fill #0

## 2022-03-23 MED ORDER — ATORVASTATIN CALCIUM 20 MG PO TABS
20.0000 mg | ORAL_TABLET | Freq: Every day | ORAL | 3 refills | Status: DC
  Filled 2022-03-23: qty 30, 30d supply, fill #0
  Filled 2022-10-12: qty 30, 30d supply, fill #1
  Filled 2023-01-19: qty 30, 30d supply, fill #2

## 2022-03-23 MED ORDER — BASAGLAR KWIKPEN 100 UNIT/ML ~~LOC~~ SOPN
40.0000 [IU] | PEN_INJECTOR | Freq: Every day | SUBCUTANEOUS | 6 refills | Status: DC
  Filled 2022-03-23: qty 12, 30d supply, fill #0

## 2022-03-23 MED ORDER — GABAPENTIN 300 MG PO CAPS
300.0000 mg | ORAL_CAPSULE | Freq: Every day | ORAL | 3 refills | Status: DC
  Filled 2022-03-23: qty 30, 30d supply, fill #0

## 2022-03-23 MED ORDER — AMLODIPINE BESYLATE 5 MG PO TABS
5.0000 mg | ORAL_TABLET | Freq: Every day | ORAL | 3 refills | Status: DC
  Filled 2022-03-23: qty 30, 30d supply, fill #0
  Filled 2022-08-11: qty 90, 90d supply, fill #1

## 2022-03-23 NOTE — Progress Notes (Signed)
Subjective:  Patient ID: Benjamin Thompson, male    DOB: 25-Jul-1970  Age: 51 y.o. MRN: [id number]  CC: New Patient (Initial Visit)   HPI Benjamin Thompson is a 51 y.o. year old male with a history of hypertension, type 2 diabetes mellitus (A1c 7.9), history of septic arthritis of the sternoclavicular joint, liver abscess in 10/2021 status post incision and drainage, treatment with antibiotics.  Who presents today to establish care. He is accompanied by a Spanish in person interpreter. His last visit was to the internal medicine clinic in 01/2022.  Interval History:  His BP is elevated and he did not take his antihypertensive today and yesterday and states he forgot. He states he has only been taking Amlodipine but not Lisinopril which appears on his medication list.  Vania Rea appears on his med list however he has not been taking it but has been on Lantus 40 units and Metformin. He has tingling in his feet and was prescribed Gabapentin which he has also not been taking.  He complains of erectile dysfunction and would like a medication for this. Last seen by infectious disease 2 months ago and per notes his sternoclavicular joint septic arthritis and liver abscess have been cured. Past Medical History:  Diagnosis Date   Diabetes mellitus without complication ([place])    Hypertension     Past Surgical History:  Procedure Laterality Date   APPENDECTOMY     APPLICATION OF WOUND VAC Left 11/06/2021   Procedure: APPLICATION OF WOUND VAC;  Surgeon: Dahlia Byes, MD;  Location: [facility] OR;  Service: Vascular;  Laterality: Left;   IRRIGATION AND DEBRIDEMENT ABSCESS Left 11/06/2021   Procedure: IRRIGATION AND DEBRIDEMENT STERNOCLAVICULAR JOINT ABSCESS;  Surgeon: Dahlia Byes, MD;  Location: [facility];  Service: Vascular;  Laterality: Left;    Family History  Problem Relation Age of Onset   Healthy Mother    Hypertension Brother    Diabetes Brother    Heart disease Neg Hx     Social History    Socioeconomic History   Marital status: Married    Spouse name: Verdis Frederickson   Number of children: 3   Years of education: Not on file   Highest education level: Not on file  Occupational History   Not on file  Tobacco Use   Smoking status: Never   Smokeless tobacco: Not on file  Vaping Use   Vaping Use: Never used  Substance and Sexual Activity   Alcohol use: Not Currently    Comment: little   Drug use: No   Sexual activity: Not on file  Other Topics Concern   Not on file  Social History Narrative   Not on file   Social Determinants of Health   Financial Resource Strain: Not on file  Food Insecurity: Not on file  Transportation Needs: Not on file  Physical Activity: Not on file  Stress: Not on file  Social Connections: Not on file    No Known Allergies  Outpatient Medications Prior to Visit  Medication Sig Dispense Refill   acetaminophen (TYLENOL) 500 MG tablet Take 1 tablet (500 mg total) by mouth every 6 (six) hours as needed for moderate pain. 30 tablet 0   Blood Glucose Monitoring Suppl (ACCU-CHEK GUIDE) w/Device KIT Use As Directed 1 kit 0   glucose blood test strip Use test strip to check sugar every morning. 100 each 3   Insulin Pen Needle 32G X 4 MM MISC Use As Directed 100 each 0   Microlet Lancets MISC Use  to check sugar every morning 100 each 3   amLODipine (NORVASC) 5 MG tablet Take 1 tablet (5 mg total) by mouth daily. 30 tablet 11   atorvastatin (LIPITOR) 20 MG tablet Take 1 tablet (20 mg total) by mouth daily. 30 tablet 11   empagliflozin (JARDIANCE) 10 MG TABS tablet Take 1 tablet (10 mg total) by mouth daily. 90 tablet 0   Insulin Glargine (BASAGLAR KWIKPEN) 100 UNIT/ML Inject 40 Units into the skin daily. 12 mL 6   lisinopril (ZESTRIL) 5 MG tablet Take 1 tablet (5 mg total) by mouth daily. 30 tablet 3   metFORMIN (GLUCOPHAGE) 500 MG tablet Take 2 tablets (1,000 mg total) by mouth 2 (two) times daily with a meal. 120 tablet 2   gabapentin (NEURONTIN) 100  MG capsule Take 3 capsules (300 mg total) by mouth at bedtime. 90 capsule 0   No facility-administered medications prior to visit.     ROS Review of Systems  Constitutional:  Negative for activity change and appetite change.  HENT:  Negative for sinus pressure and sore throat.   Respiratory:  Negative for chest tightness, shortness of breath and wheezing.   Cardiovascular:  Negative for chest pain and palpitations.  Gastrointestinal:  Negative for abdominal distention, abdominal pain and constipation.  Genitourinary: Negative.   Musculoskeletal: Negative.   Psychiatric/Behavioral:  Negative for behavioral problems and dysphoric mood.     Objective:  BP (!) 159/97   Pulse 78   Temp 98.2 F (36.8 C) (Oral)   Ht _0  (1.626 m)   Wt 163 lb 3.2 oz (74 kg)   SpO2 97%   BMI 28.01 kg/m      03/23/2022    1:56 PM 02/09/2022    2:40 PM 02/01/2022    4:10 PM  BP/Weight  Systolic BP 371 062 694  Diastolic BP 97 93 96  Wt. (Lbs) 163.2 161.4   BMI 28.01 kg/m2 28.59 kg/m2       Physical Exam Constitutional:      Appearance: He is well-developed.  Cardiovascular:     Rate and Rhythm: Normal rate.     Heart sounds: Normal heart sounds. No murmur heard. Pulmonary:     Effort: Pulmonary effort is normal.     Breath sounds: Normal breath sounds. No wheezing or rales.  Chest:     Chest wall: No tenderness.  Abdominal:     General: Bowel sounds are normal. There is no distension.     Palpations: Abdomen is soft. There is no mass.     Tenderness: There is no abdominal tenderness.  Musculoskeletal:        General: Normal range of motion.     Right lower leg: No edema.     Left lower leg: No edema.  Neurological:     Mental Status: He is alert and oriented to person, place, and time.  Psychiatric:        Mood and Affect: Mood normal.        Latest Ref Rng & Units 12/15/2021    3:12 PM 11/26/2021   11:05 AM 11/13/2021   12:16 AM  CMP  Glucose 65 - 99 mg/dL 227  144  77    BUN 7 - 25 mg/dL _1 Creatinine 0.60 - 1.29 mg/dL 0.79  0.87  0.84   Sodium 135 - 146 mmol/L 140  137  141   Potassium 3.5 - 5.3 mmol/L 3.9  4.2  3.5   Chloride  98 - 110 mmol/L 101  103  104   CO2 20 - 32 mmol/L _0 Calcium 8.6 - 10.3 mg/dL 9.6  9.3  9.2     Lipid Panel     Component Value Date/Time   CHOL 160 11/25/2021 1435   TRIG 327 (H) 11/25/2021 1435   HDL 32 (L) 11/25/2021 1435   CHOLHDL 5.0 11/25/2021 1435   CHOLHDL 5.0 03/09/2008 0000   VLDL 48 03/09/2008 0000   LDLCALC 75 11/25/2021 1435    CBC    Component Value Date/Time   WBC 7.1 12/15/2021 1512   RBC 5.24 12/15/2021 1512   HGB 15.1 12/15/2021 1512   HCT 45.1 12/15/2021 1512   PLT 290 12/15/2021 1512   MCV 86.1 12/15/2021 1512   MCH 28.8 12/15/2021 1512   MCHC 33.5 12/15/2021 1512   RDW 14.3 12/15/2021 1512   LYMPHSABS 1.9 11/16/2021 0544   MONOABS 0.6 11/16/2021 0544   EOSABS 0.2 11/16/2021 0544   BASOSABS 0.1 11/16/2021 0544    Lab Results  Component Value Date   HGBA1C 7.9 (A) 02/01/2022    Assessment & Plan:   1. Neuropathy Uncontrolled Restarted gabapentin - gabapentin (NEURONTIN) 300 MG capsule; Take 1 capsule (300 mg total) by mouth at bedtime.  Dispense: 30 capsule; Refill: 3  2. Other male erectile dysfunction - sildenafil (VIAGRA) 50 MG tablet; Take 1 tablet (50 mg total) by mouth daily as needed for erectile dysfunction. At Least 24 hours between doses  Dispense: 10 tablet; Refill: 0  3. Uncontrolled type 2 diabetes mellitus with hyperglycemia, without long-term current use of insulin (HCC) Suboptimally controlled with A1c of 7.9 He has not been taking Jardiance which I have restarted Continue Lantus and metformin Counseled on Diabetic diet, my plate method, 008 minutes of moderate intensity exercise/week Blood sugar logs with fasting goals of 80-120 mg/dl, random of less than 180 and in the event of sugars less than 60 mg/dl or greater than 400 mg/dl encouraged to  notify the clinic. Advised on the need for annual eye exams, annual foot exams, Pneumonia vaccine. - Insulin Glargine (BASAGLAR KWIKPEN) 100 UNIT/ML; Inject 40 Units into the skin daily.  Dispense: 12 mL; Refill: 6 - empagliflozin (JARDIANCE) 10 MG TABS tablet; Take 1 tablet (10 mg total) by mouth daily.  Dispense: 90 tablet; Refill: 1 - atorvastatin (LIPITOR) 20 MG tablet; Take 1 tablet (20 mg total) by mouth daily.  Dispense: 30 tablet; Refill: 3 - metFORMIN (GLUCOPHAGE) 500 MG tablet; Take 2 tablets (1,000 mg total) by mouth 2 (two) times daily with a meal.  Dispense: 120 tablet; Refill: 2  4. Essential hypertension Uncontrolled due to the fact that he is yet to take his antihypertensive which I have refilled Medication adherence strongly emphasized Counseled on blood pressure goal of less than 130/80, low-sodium, DASH diet, medication compliance, 150 minutes of moderate intensity exercise per week. Discussed medication compliance, adverse effects. - amLODipine (NORVASC) 5 MG tablet; Take 1 tablet (5 mg total) by mouth daily.  Dispense: 30 tablet; Refill: 3  5. Need for immunization against influenza - Flu Vaccine QUAD 20moIM (Fluarix, Fluzone & Alfiuria Quad PF)  Meds ordered this encounter  Medications   Insulin Glargine (BASAGLAR KWIKPEN) 100 UNIT/ML    Sig: Inject 40 Units into the skin daily.    Dispense:  12 mL    Refill:  6   gabapentin (NEURONTIN) 300 MG capsule    Sig: Take 1 capsule (300 mg total)  by mouth at bedtime.    Dispense:  30 capsule    Refill:  3   sildenafil (VIAGRA) 50 MG tablet    Sig: Take 1 tablet (50 mg total) by mouth daily as needed for erectile dysfunction. At Least 24 hours between doses    Dispense:  10 tablet    Refill:  0   empagliflozin (JARDIANCE) 10 MG TABS tablet    Sig: Take 1 tablet (10 mg total) by mouth daily.    Dispense:  90 tablet    Refill:  1   amLODipine (NORVASC) 5 MG tablet    Sig: Take 1 tablet (5 mg total) by mouth daily.     Dispense:  30 tablet    Refill:  3   atorvastatin (LIPITOR) 20 MG tablet    Sig: Take 1 tablet (20 mg total) by mouth daily.    Dispense:  30 tablet    Refill:  3   metFORMIN (GLUCOPHAGE) 500 MG tablet    Sig: Take 2 tablets (1,000 mg total) by mouth 2 (two) times daily with a meal.    Dispense:  120 tablet    Refill:  2    Follow-up: Return in about 3 months (around 06/23/2022) for Chronic medical conditions.       Charlott Rakes, MD, FAAFP. [facility] [place], [place]   03/23/2022, 6:10 PM

## 2022-03-23 NOTE — Patient Instructions (Addendum)
Disfuncin erctil Erectile Dysfunction La disfuncin erctil (DE) es la incapacidad de lograr o mantener una ereccin para Management consultant. La DE se considera un sntoma de un trastorno subyacente y no una enfermedad. La DE puede incluir lo siguiente: Imposibilidad de Presenter, broadcasting. Falta de ereccin suficiente en el pene para permitir la penetracin. Prdida de la ereccin antes de Corporate investment banker. Cules son las causas? Esta afeccin puede ser causada por lo siguiente: Causas fsicas como: Problemas arteriales. Esto puede incluir enfermedad cardaca, presin arterial alta, aterosclerosis y diabetes. Problemas hormonales como en los casos de bajo nivel de [place]. Obesidad. Problemas neurolgicos. Se incluyen lesiones en la espalda o la pelvis, esclerosis mltiple, enfermedad de Parkinson, lesin en la mdula espinal y accidente cerebrovascular. Ciertos medicamentos, como los siguientes: Analgsicos. Antidepresivos. Medicamentos para la presin arterial y diurticos. Medicamentos para Management consultant. Antihistamnicos. Relajantes musculares. Factores en el estilo de vida como: Consumo de drogas, como marihuana, cocana u opioides. Consumo excesivo de alcohol. Fumar. Falta de actividad fsica o de ejercicio. Causas psicolgicas como: Ansiedad o estrs. Tristeza o depresin. Agotamiento. [place] al rendimiento sexual. Culpa. Cules son los signos o sntomas? Los sntomas de esta afeccin incluyen: Imposibilidad de Presenter, broadcasting. Falta de ereccin suficiente en el pene para permitir la penetracin. Falta de ereccin antes de Corporate investment banker. A veces puede haber erecciones normales, pero con episodios insatisfactorios frecuentes. [place] satisfaccin sexual en ambos miembros de la pareja debido a los problemas erctiles. Con la ereccin el pene puede curvarse. La curvatura del pene puede provocar dolor o puede ser demasiado pronunciada como para permitir el  coito. No tener nunca erecciones nocturnas o matutinas. Cmo se diagnostica? Esta afeccin frecuentemente se diagnostica mediante: Un examen fsico para descartar otras enfermedades o problemas especficos en el pene. Preguntas detalladas acerca del problema. Pruebas, como, por ejemplo: Anlisis de sangre para descartar diabetes mellitus o niveles altos de colesterol, o para medir los niveles de hormonas. Otros anlisis para Hotel manager subyacentes. Sherlyn Lees para descartar cicatrices. Una prueba para examinar el flujo sanguneo en el pene. Estudio del sueo para medir las erecciones nocturnas. Cmo se trata? El tratamiento para esta afeccin puede incluir lo siguiente: Medicamentos, como por ejemplo: Medicamentos que se toman por boca para ayudar a Transport planner ereccin (medicamentos orales). Terapia de reemplazo hormonal para reemplazar los bajos niveles de [place]. Medicamentos que se inyectan en el pene. El mdico puede ensearle a aplicarse estas inyecciones en su casa. Medicamentos que se administran con un tubo Environmental education officer. El tubo se inserta en la abertura que se encuentra en la punta del pene, que es la abertura de la uretra. Se coloca un diminuto grnulo de medicamento en la uretra. El grnulo se disuelve y mejora la funcin erctil. Esto tambin se denomina terapia MUSE (sistema uretral medicado para erecciones). Bomba de vaco. Esta es una bomba con un anillo. La bomba y el anillo se colocan en el pene y se usan para crear una presin que ayuda a que el pene se ponga erecto. Implantacin quirrgica de una prtesis de pene. En este procedimiento, puede recibir [employer] siguientes tipos de prtesis: Implante inflable. Consiste en cilindros, una bomba y un depsito. Los cilindros pueden inflarse con un lquido que ayuda a crear Chartered loss adjuster, y puede desinflarse despus del coito. Implante semirrgido. Esto [employer] varas de goma con silicona. Las varas  proporcionan algo de rigidez. Tambin son flexibles, por lo que el pene puede curvarse hacia abajo en  su posicin normal y Higher education careers adviser para Management consultant. Ciruga de vasos sanguneos para mejorar el flujo de sangre al pene. Durante este procedimiento, se coloca un vaso sanguneo de una parte diferente del cuerpo dentro del pene para permitir que la sangre fluya alrededor (derivacin o bypass) de los vasos sanguneos daados u obstruidos. Cambios en el estilo de vida, como hacer ms ejercicio, perder peso y dejar de fumar. Siga estas instrucciones en su casa: Medicamentos  Use los medicamentos de venta libre y los recetados solamente como se lo haya indicado el mdico. No aumente la dosis sin analizarlo primero con su mdico. Si est usando autoinyecciones, aplqueselas segn las indicaciones de su mdico. Asegrese de evitar las venas que estn en la superficie del pene. Despus de aplicar una inyeccin, ejerza presin en el sitio de la inyeccin durante 5 minutos. Hable con el mdico sobre cmo prevenir los dolores de cabeza mientras toma medicamentos para la DE. Estos medicamentos pueden causar un dolor de cabeza repentino debido al aumento del flujo sanguneo en el cuerpo. Instrucciones generales Haga ejercicio regularmente como se lo haya indicado el mdico. Si debe perder peso, hable con su mdico. No consuma ningn producto que contenga nicotina o tabaco. Estos productos incluyen cigarrillos, tabaco para mascar y aparatos de vapeo, como los Administrator, Civil Service. Si necesita ayuda para dejar de fumar, consulte al [employer]. Antes de usar una bomba de vaco, lea las instrucciones que vienen con la bomba y hgale a su mdico cualquier pregunta que tenga. Concurra a todas las visitas de seguimiento. Esto es importante. Comunquese con un mdico si: Siente nuseas. Tiene vmitos. Tiene dolores de cabeza repentinos mientras toma medicamentos para la DE. Tiene inquietudes  relacionadas con su salud sexual. Solicite ayuda de inmediato si: Se administra medicamentos orales o inyectables y tiene una ereccin que dura ms de 4 horas. Si su mdico no est disponible, concurra al servicio de emergencias ms cercano para realizar una evaluacin. Una ereccin que dure ms de 4 horas puede dar como resultado un dao permanente en el pene. Siente un dolor intenso en la ingle o el abdomen. Presenta un enrojecimiento o hinchazn grave en el pene. Tiene una zona colorada que se extiende en la ingle o la zona baja del abdomen. No puede orinar. Tiene dolor en el pecho o un latido cardaco rpido (palpitaciones) despus de tomar los [employer]. Estos sntomas pueden representar un problema grave que constituye Radio broadcast assistant. No espere a ver si los sntomas desaparecen. Solicite atencin mdica de inmediato. Comunquese con el servicio de emergencias de su localidad (911 en los [place]). No conduzca por sus propios medios [employer] hospital. Resumen La disfuncin erctil (DE) es la incapacidad de lograr o Pharmacologist una ereccin durante las relaciones sexuales. Esta afeccin se diagnostica en funcin de un examen fsico, los sntomas y anlisis para Production assistant, radio causa. El tratamiento vara segn la causa, y puede incluir medicamentos, terapia hormonal, ciruga o bomba de vaco. Es posible que deba realizar visitas de seguimiento para asegurarse de que est usando sus medicamentos o dispositivos correctamente. Obtenga ayuda de inmediato si se administra medicamentos orales o inyectables y tiene una ereccin que dura ms de 4 horas. Esta informacin no tiene Theme park manager el consejo del mdico. Asegrese de hacerle al mdico cualquier pregunta que tenga. Vacuna antigripal (inactivada o recombinante): lo que debe saber Influenza (Flu) Vaccine (Inactivated or Recombinant): What You Need to Know 1. Por qu vacunarse? La vacuna antigripal puede prevenir la gripe. La  gripe es una enfermedad contagiosa que se disemina en los [place] cada ao, por lo general, Eusebio Me octubre y Klemme. Cualquier persona puede contraer gripe, pero es ms peligrosa para Runner, broadcasting/film/video. Los bebs y los nios pequeos, los [employer] de 65 aos y las Financial planner, as [employer] personas que tienen ciertas enfermedades o cuyo sistema inmunitario est debilitado, corren un riesgo mayor de tener complicaciones debido a la gripe. La neumona, la bronquitis, las infecciones de los senos paranasales y las infecciones de odos son ejemplos de complicaciones relacionadas con la gripe. Si tiene una afeccin, por ejemplo, enfermedad cardaca, cncer o diabetes, la gripe puede empeorarla. La gripe puede causar fiebre y escalofros, dolor de garganta, dolores musculares, fatiga, tos, dolor de [place] y secrecin o congestin nasal. Algunas personas pueden tener vmitos y Barnett Hatter, [place] esto es ms frecuente en los nios que en los adultos. En un ao promedio, miles de [employer] [place] debido a la gripe, y muchas ms deben ser hospitalizadas. Cada ao, la vacuna antigripal previene millones de enfermedades y evita visitas al mdico relacionadas con la gripe. 2. Vacunas antigripales Los CDC (Centros para el Control y la Prevencin de [place]) recomiendan que todas las personas a Glass blower/designer de los 6 meses de edad se vacunen cada temporada de gripe. Es posible que los nios de 6 meses a 8 aos deban recibir 2 dosis durante la misma temporada de gripe. Todas las dems personas tienen que aplicarse 1 sola dosis cada temporada de gripe. La vacuna comienza a surtir Librarian, academic 2 semanas despus de su aplicacin. Hay muchos virus de la gripe, y Estate agent. Cada ao, se elabora una nueva vacuna antigripal para brindar proteccin contra los virus gripales que se cree probablemente causen la enfermedad en la siguiente temporada de gripe. Incluso si la vacuna no  es especfica para esos virus, aun as puede brindar cierta proteccin. La vacuna antigripal no causa gripe. La vacuna antigripal puede administrarse al mismo tiempo que otras vacunas. 3. Hable con el mdico Comunquese con la persona que le coloca las vacunas si la persona que la recibe: Ha tenido una reaccin alrgica despus de Neomia Dear dosis previa de la vacuna antigripal o tiene alguna alergia grave, potencialmente mortal Alguna vez tuvo sndrome de Pension scheme manager (tambin llamado "SGB") En algunos casos, es posible que el mdico decida posponer la aplicacin de la vacuna antigripal hasta una visita en el futuro. La vacuna antigripal puede administrarse en cualquier momento durante el embarazo. Las personas que estn o vayan a Microbiologist durante la temporada de gripe deben recibir la vacuna antigripal inactivada. Las personas que sufren trastornos menores, como un resfro, pueden vacunarse. Las personas que tienen enfermedades moderadas o graves generalmente deben esperar hasta recuperarse para poder recibir la vacuna antigripal. Su mdico puede darle ms informacin. 4. Riesgos de [place] reaccin a la vacuna Despus de recibir la vacunacin antigripal, Barista, enrojecimiento e hinchazn en el lugar de la inyeccin, fiebre, dolores musculares y Engineer, mining de [place]. Puede haber un pequeo aumento del riesgo de sufrir sndrome de Pension scheme manager (SGB) despus de la aplicacin de la vacuna antigripal inactivada. Los nios pequeos que reciben la vacuna antigripal junto con la vacuna antineumoccica (PCV13) o la DTaP en el mismo momento pueden tener una probabilidad un poco ms elevada de tener una convulsin debido a la fiebre. Informe al mdico si un nio que est recibiendo la vacuna antigripal ha tenido una convulsin alguna vez. Las personas a Scientist, research (physical sciences)  despus de procedimientos mdicos, incluida la vacunacin. Informe al mdico si se siente mareado, tiene cambios en la visin o  zumbidos en los odos. Al igual que con cualquier [employer], existe una probabilidad muy remota de que una vacuna cause una reaccin alrgica grave, otra lesin grave o la muerte. 5. Qu pasa si se presenta un problema grave? Podra producirse una reaccin alrgica despus de que la persona vacunada abandone la clnica. Si observa signos de Nurse, mental health grave (ronchas, hinchazn de la cara y la garganta, dificultad para respirar, latidos cardacos acelerados, mareos o debilidad), llame al 9-1-1 y lleve a la persona al hospital ms cercano. Si se presentan otros signos que le preocupan, comunquese con su mdico. Las reacciones adversas deben informarse al Sistema de Informe de Eventos Adversos de Clinical biochemist (VAERS). Por lo general, el mdico presenta este informe o puede hacerlo usted mismo. Visite el sitio web del VAERS en www.vaers.SamedayNews.es o llame al [phone]. El VAERS es solo para Electrical engineer, y los miembros de su personal no proporcionan asesoramiento mdico. 6. [place] de Compensacin de Daos por [place] de Compensacin de Daos por Clinical biochemist (National Vaccine Injury Fiserv, Runner, broadcasting/film/video) es un programa federal que fue creado para Patent examiner a las personas que puedan haber sufrido daos al recibir ciertas vacunas. Las Artist a presuntas lesiones o muerte debidas a la vacunacin tienen un lmite de tiempo para su presentacin, que puede ser de tan solo [employer]. Visite el sitio web del VICP en GoldCloset.[email or web address].ee o llame al [phone] para obtener ms informacin acerca del programa y de cmo presentar un reclamo. 7. Cmo puedo obtener ms informacin? Pregntele a su mdico. Comunquese con el servicio de salud de su localidad o su estado. Visite el sitio web de Environmental manager) (Administracin de Alimentos y Chief Strategy Officer) para ver los prospectos de las vacunas e informacin adicional en  TraderRating.uy. Comunquese con Garment/textile technologist for [employer]) (Centros para el Control y la Prevencin de [place]): Llame al [phone] (1-800-CDC-INFO) o Visite el sitio web de los CDC en https://gibson.com/. Fuente: Declaracin de informacin de los CDC sobre la vacuna antigripal inactivada (12/21/2019) Este mismo material est disponible en http://www.wolf.info/ sin cargo. Esta informacin no tiene Marine scientist el consejo del mdico. Asegrese de hacerle al mdico cualquier pregunta que tenga. Document Revised: 04/21/2021 Document Reviewed: 01/22/2021 Elsevier Patient Education  [place] Revised: 08/23/2020 Document Reviewed: 08/23/2020 Elsevier Patient Education  [place].

## 2022-03-24 ENCOUNTER — Other Ambulatory Visit: Payer: Self-pay

## 2022-04-02 ENCOUNTER — Other Ambulatory Visit: Payer: Self-pay

## 2022-04-13 ENCOUNTER — Other Ambulatory Visit: Payer: Self-pay

## 2022-04-15 ENCOUNTER — Other Ambulatory Visit: Payer: Self-pay

## 2022-04-16 ENCOUNTER — Other Ambulatory Visit: Payer: Self-pay

## 2022-06-18 ENCOUNTER — Other Ambulatory Visit: Payer: Self-pay

## 2022-06-23 ENCOUNTER — Ambulatory Visit: Payer: Self-pay | Admitting: Family Medicine

## 2022-07-09 ENCOUNTER — Other Ambulatory Visit: Payer: Self-pay

## 2022-07-13 ENCOUNTER — Other Ambulatory Visit: Payer: Self-pay

## 2022-07-29 ENCOUNTER — Telehealth: Payer: Self-pay | Admitting: *Deleted

## 2022-07-29 NOTE — Telephone Encounter (Signed)
Did not contact pt by phone; letter in Spanish sent to pt along with [facility] Early Detection brochure, asking patient to discuss with Dr. Margarita Rana at his next appt scheduled for September 13, 2022 at 3:50 PM.  English translation of letter included in this note: Dr. Margarita Rana has asked her team to reach out to you to let you know that you are actually overdue for colorectal cancer screening. There are several options for completing this screening, and Dr. Margarita Rana can discuss those with you when you see her at your next appointment.  We have enclosed a general information brochure for you to review prior to that appointment, to share some basic information about the importance for having this screening done, not just to detect, but sometimes to prevent cancer from developing.   Thank you for considering this important health issue and being willing to talk to her about it at this next visit on September 13, 2022 - 3:50 P   Sincerely, Dr. Smitty Pluck team, [facility] and [facility]

## 2022-08-11 ENCOUNTER — Other Ambulatory Visit: Payer: Self-pay

## 2022-08-12 ENCOUNTER — Other Ambulatory Visit: Payer: Self-pay

## 2022-08-13 ENCOUNTER — Ambulatory Visit (HOSPITAL_COMMUNITY)
Admission: EM | Admit: 2022-08-13 | Discharge: 2022-08-13 | Disposition: A | Discharge status: Home / Self Care | Payer: Self-pay | Attending: Nurse Practitioner | Admitting: Nurse Practitioner

## 2022-08-13 ENCOUNTER — Encounter (HOSPITAL_COMMUNITY): Payer: Self-pay

## 2022-08-13 ENCOUNTER — Other Ambulatory Visit: Payer: Self-pay

## 2022-08-13 DIAGNOSIS — M940 Chondrocostal junction syndrome [Tietze]: Secondary | ICD-10-CM

## 2022-08-13 DIAGNOSIS — R21 Rash and other nonspecific skin eruption: Secondary | ICD-10-CM

## 2022-08-13 LAB — POCT URINALYSIS DIPSTICK, ED / UC
Bilirubin Urine: NEGATIVE
Glucose, UA: >=1000 mg/dL — AB
Hgb urine dipstick: NEGATIVE
Ketones, ur: NEGATIVE mg/dL
Leukocytes,Ua: NEGATIVE
Nitrite: NEGATIVE
Protein, ur: NEGATIVE mg/dL
Specific Gravity, Urine: 1.015 (ref 1.005–1.030)
Urobilinogen, UA: 0.2 mg/dL (ref 0.0–1.0)
pH: 5.5 (ref 5.0–8.0)

## 2022-08-13 MED ORDER — TIZANIDINE HCL 4 MG PO TABS
4.0000 mg | ORAL_TABLET | Freq: Three times a day (TID) | ORAL | 0 refills | Status: DC | PRN
  Filled 2022-08-13: qty 30, 10d supply, fill #0

## 2022-08-13 MED ORDER — TRIAMCINOLONE ACETONIDE 0.1 % EX OINT
1.0000 | TOPICAL_OINTMENT | Freq: Two times a day (BID) | CUTANEOUS | 0 refills | Status: DC
  Filled 2022-08-13: qty 30, 15d supply, fill #0

## 2022-08-13 NOTE — ED Provider Notes (Signed)
[facility]    CSN: [id number] Arrival date & time: 08/13/22  [id number]      History   Chief Complaint Chief Complaint  Patient presents with   Back Pain   Flank Pain    HPI Notnamed Benjamin Thompson is a 52 y.o. male.   Patient presents for back pain that started gradually 3-4 months ago.  No recent accident, fall, trauma, or known injury to the area..  Reports the pain is 7/10 and the pain is worse when he touches the area.  Reports the pain does not radiate to down leg.  Has not tried anything for the pain so far.  Patient denies numbness or tingling going to the legs, saddle anesthesia, new bowel or bladder incontinence, fever, nausea/vomiting, dysuria/urinary regnancy, and hematuria since symptoms began.  Reports he is concerned because he had liver surgery last year and is concerned because it may be something in his liver.  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  Patient also reports history of disc disease in his back and takes a chronic medication for that which has not been helping with the pain in his side.  Reports he has a history of diabetes and has chronic discomfort in the bottoms of both of his feet, however this is unchanged since symptoms began.  Reports he does not check his blood sugar on a regular basis and his doctor took him off insulin and put him on Jardiance.  Reports he has a follow-up scheduled with his doctor next month.  Patient is also concerned about a rash on his back that popped up since he started the new job.  Reports it is itchy and he tried over-the-counter cream that did not help.  Is not spreading or getting worse, however is bothersome.      Past Medical History:  Diagnosis Date   Diabetes mellitus without complication ([place])    Hypertension     Patient Active Problem List   Diagnosis Date Noted   Abscess re-check 02/01/2022   Dressing change 01/12/2022   Diabetic peripheral neuropathy ([place])  12/30/2021   Healthcare maintenance 12/30/2021   Septic arthritis of sternoclavicular joint, left ([place]) 11/04/2021   Liver abscess 10/19/2021   Essential hypertension 10/15/2021   Mixed hyperlipidemia 10/15/2021   Type II diabetes mellitus ([place]) 03/07/2008   MICROSCOPIC HEMATURIA 03/07/2008   GERD (gastroesophageal reflux disease) 01/25/2008    Past Surgical History:  Procedure Laterality Date   APPENDECTOMY     APPLICATION OF WOUND VAC Left 11/06/2021   Procedure: APPLICATION OF WOUND VAC;  Surgeon: Dahlia Byes, MD;  Location: [facility];  Service: Vascular;  Laterality: Left;   IRRIGATION AND DEBRIDEMENT ABSCESS Left 11/06/2021   Procedure: IRRIGATION AND DEBRIDEMENT STERNOCLAVICULAR JOINT ABSCESS;  Surgeon: Dahlia Byes, MD;  Location: [facility];  Service: Vascular;  Laterality: Left;       Home Medications    Prior to Admission medications   Medication Sig Start Date End Date Taking? Authorizing Provider  acetaminophen (TYLENOL) 500 MG tablet Take 1 tablet (500 mg total) by mouth every 6 (six) hours as needed for moderate pain. 11/27/21  Yes Nooruddin, Marlene Lard, MD  amLODipine (NORVASC) 5 MG tablet Take 1 tablet (5 mg total) by mouth daily. 03/23/22  Yes Charlott Rakes, MD  atorvastatin (LIPITOR) 20 MG tablet Take 1 tablet (20 mg total) by mouth daily. 03/23/22  Yes Newlin, Charlane Ferretti, MD  empagliflozin (JARDIANCE) 10 MG TABS tablet Take 1 tablet (10  mg total) by mouth daily. 03/23/22  Yes Charlott Rakes, MD  gabapentin (NEURONTIN) 300 MG capsule Take 1 capsule (300 mg total) by mouth at bedtime. 03/23/22  Yes Newlin, Enobong, MD  glucose blood test strip Use test strip to check sugar every morning. 02/01/22  Yes Sanjuan Dame, MD  Insulin Glargine (BASAGLAR KWIKPEN) 100 UNIT/ML Inject 40 Units into the skin daily. 03/23/22 10/19/22 Yes Charlott Rakes, MD  Insulin Pen Needle 32G X 4 MM MISC Use As Directed 10/20/21  Yes Elgergawy, Silver Huguenin, MD  metFORMIN (GLUCOPHAGE) 500 MG tablet Take 2  tablets (1,000 mg total) by mouth 2 (two) times daily with a meal. 03/23/22  Yes Newlin, Enobong, MD  sildenafil (VIAGRA) 50 MG tablet Take 1 tablet (50 mg total) by mouth daily as needed for erectile dysfunction. At Least 24 hours between doses 03/23/22  Yes Newlin, Enobong, MD  tiZANidine (ZANAFLEX) 4 MG tablet Take 1 tablet (4 mg total) by mouth every 8 (eight) hours as needed for muscle spasms. Do not take with alcohol or while driving or operating heavy machinery.  May cause drowsiness. 08/13/22  Yes Noemi Chapel A, NP  triamcinolone ointment (KENALOG) 0.1 % Apply 1 Application topically 2 (two) times daily. Use sparingly twice daily as needed for itching; do not use for more than 2 weeks in a row. 08/13/22  Yes Eulogio Bear, NP  Blood Glucose Monitoring Suppl (ACCU-CHEK GUIDE) w/Device KIT Use As Directed 10/20/21   Elgergawy, Silver Huguenin, MD  Microlet Lancets MISC Use to check sugar every morning 02/01/22   Sanjuan Dame, MD    Family History Family History  Problem Relation Age of Onset   Healthy Mother    Hypertension Brother    Diabetes Brother    Heart disease Neg Hx     Social History Social History   Tobacco Use   Smoking status: Never  Vaping Use   Vaping Use: Never used  Substance Use Topics   Alcohol use: Not Currently    Comment: little   Drug use: No     Allergies   Patient has no known allergies.   Review of Systems Review of Systems Per HPI  Physical Exam Triage Vital Signs ED Triage Vitals  Enc Vitals Group     BP 08/13/22 1022 (!) 162/93     Pulse Rate 08/13/22 1022 65     Resp 08/13/22 1022 18     Temp 08/13/22 1022 98.2 F (36.8 C)     Temp Source 08/13/22 1022 Oral     SpO2 08/13/22 1022 98 %     Weight --      Height 08/13/22 1021 5\' 3"  (1.6 m)     Head Circumference --      Peak Flow --      Pain Score 08/13/22 1020 9     Pain Loc --      Pain Edu? --      Excl. in [place]? --    No data found.  Updated Vital Signs BP (!)  162/93 (BP Location: Right Arm)   Pulse 65   Temp 98.2 F (36.8 C) (Oral)   Resp 18   Ht 5\' 3"  (1.6 m)   SpO2 98%   BMI 28.91 kg/m   Visual Acuity Right Eye Distance:   Left Eye Distance:   Bilateral Distance:    Right Eye Near:   Left Eye Near:    Bilateral Near:     Physical Exam Vitals and nursing  note reviewed.  Constitutional:      General: He is not in acute distress.    Appearance: Normal appearance. He is not toxic-appearing.  HENT:     Head: Normocephalic and atraumatic.     Mouth/Throat:     Mouth: Mucous membranes are moist.     Pharynx: Oropharynx is clear.  Pulmonary:     Effort: Pulmonary effort is normal. No respiratory distress.  Abdominal:     Tenderness: There is no right CVA tenderness or left CVA tenderness.  Musculoskeletal:       Back:     Right lower leg: No edema.     Left lower leg: No edema.     Comments: Inspection: No swelling, obvious deformity, or redness to thoracic or lumbar spine, paraspinal muscles Palpation: Right side tender to touch with deep palpation at the bottom of right rib cage in approximately area marked Neurovascular: neurovascularly intact in left and right lower extremity  Skin:    General: Skin is warm and dry.     Capillary Refill: Capillary refill takes less than 2 seconds.     Coloration: Skin is not jaundiced or pale.     Findings: Rash present. No erythema.     Comments: Hyperpigmented papular rash noted to superior and inferior posterior trunk; no erythema, fluctuance, active drainage   Neurological:     Mental Status: He is alert and oriented to person, place, and time.     Motor: No weakness.     Gait: Gait normal.  Psychiatric:        Behavior: Behavior is cooperative.      UC Treatments / Results  Labs (all labs ordered are listed, but only abnormal results are displayed) Labs Reviewed  POCT URINALYSIS DIPSTICK, ED / UC - Abnormal; Notable for the following components:      Result Value    Glucose, UA >=1000 (*)    All other components within normal limits    EKG   Radiology No results found.  Procedures Procedures (including critical care time)  Medications Ordered in UC Medications - No data to display  Initial Impression / Assessment and Plan / UC Course  I have reviewed the triage vital signs and the nursing notes.  Pertinent labs & imaging results that were available during my care of the patient were reviewed by me and considered in my medical decision making (see chart for details).   Patient is well-appearing, afebrile, not tachycardic, not tachypneic, oxygenating well on room air.  Patient is mildly hypertensive in triage, otherwise vital signs are stable.  1. Costochondritis Suspect rib cage pain secondary to strain/possible overuse Start Zanaflex Recommended follow-up with PCP for persistent or worsening symptoms despite treatment  2. Rash and nonspecific skin eruption Treat with triamcinolone ointment twice daily as needed for itching Follow-up with primary care provider for persistent worsening symptoms despite treatment  The patient was given the opportunity to ask questions.  All questions answered to their satisfaction.  The patient is in agreement to this plan.   Final Clinical Impressions(s) / UC Diagnoses   Final diagnoses:  Costochondritis  Rash and nonspecific skin eruption     Discharge Instructions      Start the Zanaflex to help with the muscular pain.    You can use the steroid ointment to help with the rash.  Follow up with your PCP if your symptoms do not improve with this treatment.     ED Prescriptions     Medication  Sig Dispense Auth. Provider   tiZANidine (ZANAFLEX) 4 MG tablet Take 1 tablet (4 mg total) by mouth every 8 (eight) hours as needed for muscle spasms. Do not take with alcohol or while driving or operating heavy machinery.  May cause drowsiness. 30 tablet Noemi Chapel A, NP   triamcinolone ointment  (KENALOG) 0.1 % Apply 1 Application topically 2 (two) times daily. Use sparingly twice daily as needed for itching; do not use for more than 2 weeks in a row. 30 g Eulogio Bear, NP      PDMP not reviewed this encounter.   Eulogio Bear, NP 08/13/22 [phone]

## 2022-08-13 NOTE — ED Triage Notes (Signed)
Pain in the right side and back onset 3-4 months ago. No known falls or injuries. Had liver surgery a year ago.

## 2022-08-13 NOTE — Discharge Instructions (Addendum)
Start the Zanaflex to help with the muscular pain.    You can use the steroid ointment to help with the rash.  Follow up with your PCP if your symptoms do not improve with this treatment.

## 2022-09-13 ENCOUNTER — Ambulatory Visit: Payer: Self-pay | Admitting: Family Medicine

## 2022-10-12 ENCOUNTER — Other Ambulatory Visit: Payer: Self-pay | Admitting: Family Medicine

## 2022-10-12 ENCOUNTER — Other Ambulatory Visit: Payer: Self-pay

## 2022-10-12 DIAGNOSIS — I10 Essential (primary) hypertension: Secondary | ICD-10-CM

## 2022-10-13 ENCOUNTER — Other Ambulatory Visit: Payer: Self-pay

## 2022-10-13 MED ORDER — AMLODIPINE BESYLATE 5 MG PO TABS
5.0000 mg | ORAL_TABLET | Freq: Every day | ORAL | 0 refills | Status: DC
Start: 2022-10-13 — End: 2023-01-19
  Filled 2022-10-13 – 2022-11-19 (×2): qty 30, 30d supply, fill #0

## 2022-11-18 ENCOUNTER — Other Ambulatory Visit: Payer: Self-pay

## 2022-11-18 ENCOUNTER — Encounter (HOSPITAL_COMMUNITY): Payer: Self-pay

## 2022-11-18 ENCOUNTER — Emergency Department (HOSPITAL_COMMUNITY)
Admission: EM | Admit: 2022-11-18 | Discharge: 2022-11-18 | Disposition: A | Discharge status: Home / Self Care | Payer: Self-pay | Attending: Emergency Medicine | Admitting: Emergency Medicine

## 2022-11-18 DIAGNOSIS — L662 Folliculitis decalvans: Secondary | ICD-10-CM | POA: Insufficient documentation

## 2022-11-18 DIAGNOSIS — E1165 Type 2 diabetes mellitus with hyperglycemia: Secondary | ICD-10-CM | POA: Insufficient documentation

## 2022-11-18 DIAGNOSIS — R739 Hyperglycemia, unspecified: Secondary | ICD-10-CM

## 2022-11-18 DIAGNOSIS — Z79899 Other long term (current) drug therapy: Secondary | ICD-10-CM | POA: Insufficient documentation

## 2022-11-18 DIAGNOSIS — L0291 Cutaneous abscess, unspecified: Secondary | ICD-10-CM

## 2022-11-18 DIAGNOSIS — Z7984 Long term (current) use of oral hypoglycemic drugs: Secondary | ICD-10-CM | POA: Insufficient documentation

## 2022-11-18 DIAGNOSIS — L739 Follicular disorder, unspecified: Secondary | ICD-10-CM

## 2022-11-18 DIAGNOSIS — L02416 Cutaneous abscess of left lower limb: Secondary | ICD-10-CM | POA: Insufficient documentation

## 2022-11-18 DIAGNOSIS — Z794 Long term (current) use of insulin: Secondary | ICD-10-CM | POA: Insufficient documentation

## 2022-11-18 DIAGNOSIS — I10 Essential (primary) hypertension: Secondary | ICD-10-CM | POA: Insufficient documentation

## 2022-11-18 LAB — CBC WITH DIFFERENTIAL/PLATELET
Abs Immature Granulocytes: 0.03 10*3/uL (ref 0.00–0.07)
Basophils Absolute: 0.1 10*3/uL (ref 0.0–0.1)
Basophils Relative: 1 %
Eosinophils Absolute: 0.1 10*3/uL (ref 0.0–0.5)
Eosinophils Relative: 2 %
HCT: 45.3 % (ref 39.0–52.0)
Hemoglobin: 14.7 g/dL (ref 13.0–17.0)
Immature Granulocytes: 1 %
Lymphocytes Relative: 22 %
Lymphs Abs: 1.4 10*3/uL (ref 0.7–4.0)
MCH: 28.2 pg (ref 26.0–34.0)
MCHC: 32.5 g/dL (ref 30.0–36.0)
MCV: 86.9 fL (ref 80.0–100.0)
Monocytes Absolute: 0.4 10*3/uL (ref 0.1–1.0)
Monocytes Relative: 7 %
Neutro Abs: 4.4 10*3/uL (ref 1.7–7.7)
Neutrophils Relative %: 67 %
Platelets: 240 10*3/uL (ref 150–400)
RBC: 5.21 MIL/uL (ref 4.22–5.81)
RDW: 13.3 % (ref 11.5–15.5)
WBC: 6.5 10*3/uL (ref 4.0–10.5)
nRBC: 0 % (ref 0.0–0.2)

## 2022-11-18 LAB — BASIC METABOLIC PANEL
Anion gap: 13 (ref 5–15)
BUN: 14 mg/dL (ref 6–20)
CO2: 23 mmol/L (ref 22–32)
Calcium: 8.6 mg/dL — ABNORMAL LOW (ref 8.9–10.3)
Chloride: 99 mmol/L (ref 98–111)
Creatinine, Ser: 1.19 mg/dL (ref 0.61–1.24)
GFR, Estimated: >60 mL/min (ref >60)
Glucose, Bld: 371 mg/dL — ABNORMAL HIGH (ref 70–99)
Potassium: 4.2 mmol/L (ref 3.5–5.1)
Sodium: 135 mmol/L (ref 135–145)

## 2022-11-18 MED ORDER — INSULIN ASPART 100 UNIT/ML IJ SOLN
5.0000 [IU] | Freq: Once | INTRAMUSCULAR | Status: AC
  Administered 2022-11-18: 5 [IU] via SUBCUTANEOUS

## 2022-11-18 MED ORDER — DOXYCYCLINE HYCLATE 100 MG PO CAPS
100.0000 mg | ORAL_CAPSULE | Freq: Two times a day (BID) | ORAL | 0 refills | Status: AC
  Filled 2022-11-18: qty 14, 7d supply, fill #0

## 2022-11-18 MED ORDER — CEPHALEXIN 250 MG PO CAPS
500.0000 mg | ORAL_CAPSULE | Freq: Once | ORAL | Status: AC
  Administered 2022-11-18: 500 mg via ORAL
  Filled 2022-11-18: qty 2

## 2022-11-18 MED ORDER — DOXYCYCLINE HYCLATE 100 MG PO TABS
100.0000 mg | ORAL_TABLET | Freq: Once | ORAL | Status: AC
  Administered 2022-11-18: 100 mg via ORAL
  Filled 2022-11-18: qty 1

## 2022-11-18 MED ORDER — MUPIROCIN CALCIUM 2 % EX CREA
1.0000 | TOPICAL_CREAM | Freq: Two times a day (BID) | CUTANEOUS | 0 refills | Status: DC
  Filled 2022-11-18: qty 15, 8d supply, fill #0

## 2022-11-18 MED ORDER — LIDOCAINE HCL (PF) 1 % IJ SOLN
5.0000 mL | Freq: Once | INTRAMUSCULAR | Status: AC
  Administered 2022-11-18: 5 mL
  Filled 2022-11-18: qty 5

## 2022-11-18 MED ORDER — CEPHALEXIN 500 MG PO CAPS
500.0000 mg | ORAL_CAPSULE | Freq: Four times a day (QID) | ORAL | 0 refills | Status: DC
  Filled 2022-11-18: qty 20, 5d supply, fill #0

## 2022-11-18 NOTE — ED Triage Notes (Signed)
Pt has an abscess on left lower outer leg approx the size of a ping pong [id number]. The abscess is intact. The abscess and the area surrounding the abscess is erythematous. Pt has 2+ swelling of left ankle and foot. Pt has 1+ left pedal pulse, cap refill less than 3 sec, hot to touch, pt walked to triage.

## 2022-11-18 NOTE — Discharge Instructions (Addendum)
You were seen in the emergency department for your abscess.  We were able to drain it for you in the emergency department.  You should continue to soak this in warm water for about 15 to 20 minutes at a time at least 2 times a day to continue to encourage drainage.  He did have a skin infection around the abscess and I have given you antibiotics and you should complete these as prescribed.  I have also given you an antibiotic ointment for when you get the red pimples around her hair follicles to prevent them from becoming a worsening infection.  You can follow-up with your primary doctor in the next few days to have your wound rechecked as well as your blood sugar rechecked as it was high here in the emergency department.  You can also talk with your primary doctor about medication management for your neuropathy.  You should return to the emergency department if you are having spreading redness from your wound, fevers despite the antibiotics or any other new or concerning symptoms.  Lo atendieron en el departamento de emergencias por su absceso.  Pudimos drenarlo en el departamento de emergencias.  Debe continuar sumergindolo en agua tibia durante aproximadamente 15 a 20 minutos seguidos al menos 2 veces al da para continuar estimulando el drenaje.  Tena una infeccin de la piel alrededor del absceso y le he dado antibiticos y debe completarlos segn lo prescrito.  Tambin le he dado un ungento antibitico para Hydrographic surveyor granos rojos alrededor de los folculos pilosos para evitar que se conviertan en una infeccin que empeore.  Puede realizar un seguimiento con su mdico de atencin primaria en los prximos das para que le vuelvan a Chief Operating Officer la herida y Air cabin crew de azcar en la sangre, ya que estaba alto aqu en el departamento de emergencias.  Tambin puede hablar con su mdico de atencin primaria sobre el manejo de medicamentos para su neuropata.  Debe regresar al departamento de emergencias si  tiene enrojecimiento extendido en la herida, fiebre a pesar de los antibiticos o cualquier otro sntoma nuevo o preocupante.

## 2022-11-18 NOTE — ED Provider Notes (Signed)
[facility] EMERGENCY DEPARTMENT AT [facility] Provider Note   CSN: [id number] Arrival date & time: 11/18/22  1634     History  Chief Complaint  Patient presents with   Abscess    Benjamin Thompson is a 52 y.o. male.  Patient is a 52 year old male with a past medical history of hypertension and diabetes presenting to the emergency department with concern for an abscess to his left shin.  The patient states that he frequently gets pimples on his arms and his legs that he relates to work when he has been working in a [employer] all day.  He states that since last Sunday the pimple on his left shin has become more swollen and red.  He denies any drainage or fevers.  He denies significant pain.  The history is provided by the patient. A language interpreter was used [place]).  Abscess      Home Medications Prior to Admission medications   Medication Sig Start Date End Date Taking? Authorizing Provider  cephALEXin (KEFLEX) 500 MG capsule Take 1 capsule (500 mg total) by mouth 4 (four) times daily. 11/18/22  Yes Elayne Snare K, DO  doxycycline (VIBRAMYCIN) 100 MG capsule Take 1 capsule (100 mg total) by mouth 2 (two) times daily for 7 days. 11/18/22 11/25/22 Yes Elayne Snare K, DO  mupirocin cream (BACTROBAN) 2 % Apply 1 Application topically 2 (two) times daily. 11/18/22  Yes Elayne Snare K, DO  acetaminophen (TYLENOL) 500 MG tablet Take 1 tablet (500 mg total) by mouth every 6 (six) hours as needed for moderate pain. 11/27/21   Nooruddin, Jason Fila, MD  amLODipine (NORVASC) 5 MG tablet Take 1 tablet (5 mg total) by mouth daily. Please schedule appointment with Dr. Alvis Lemmings. 10/13/22   Hoy Register, MD  atorvastatin (LIPITOR) 20 MG tablet Take 1 tablet (20 mg total) by mouth daily. 03/23/22   Hoy Register, MD  Blood Glucose Monitoring Suppl (ACCU-CHEK GUIDE) w/Device KIT Use As Directed 10/20/21   Elgergawy, Leana Roe, MD  empagliflozin (JARDIANCE) 10 MG TABS  tablet Take 1 tablet (10 mg total) by mouth daily. 03/23/22   Hoy Register, MD  gabapentin (NEURONTIN) 300 MG capsule Take 1 capsule (300 mg total) by mouth at bedtime. 03/23/22   Hoy Register, MD  glucose blood test strip Use test strip to check sugar every morning. 02/01/22   Evlyn Kanner, MD  Insulin Glargine (BASAGLAR KWIKPEN) 100 UNIT/ML Inject 40 Units into the skin daily. 03/23/22 10/19/22  Hoy Register, MD  Insulin Pen Needle 32G X 4 MM MISC Use As Directed 10/20/21   Elgergawy, Leana Roe, MD  metFORMIN (GLUCOPHAGE) 500 MG tablet Take 2 tablets (1,000 mg total) by mouth 2 (two) times daily with a meal. 03/23/22   Hoy Register, MD  Microlet Lancets MISC Use to check sugar every morning 02/01/22   Evlyn Kanner, MD  sildenafil (VIAGRA) 50 MG tablet Take 1 tablet (50 mg total) by mouth daily as needed for erectile dysfunction. At Least 24 hours between doses 03/23/22   Hoy Register, MD  tiZANidine (ZANAFLEX) 4 MG tablet Take 1 tablet (4 mg total) by mouth every 8 (eight) hours as needed for muscle spasms. Do not take with alcohol or while driving or operating heavy machinery.  May cause drowsiness. 08/13/22   Valentino Nose, NP  triamcinolone ointment (KENALOG) 0.1 % Apply 1 Application topically 2 (two) times daily. Use sparingly twice daily as needed for itching; do not use for more than 2 weeks  in a row. 08/13/22   Valentino Nose, NP      Allergies    Patient has no known allergies.    Review of Systems   Review of Systems  Physical Exam Updated Vital Signs BP (!) 155/88 (BP Location: Right Arm)   Pulse 69   Temp 98.5 F (36.9 C) (Oral)   Resp 15   Ht 5\' 3"  (1.6 m)   Wt 74 kg   SpO2 98%   BMI 28.90 kg/m  Physical Exam Vitals and nursing note reviewed.  Constitutional:      General: He is not in acute distress.    Appearance: Normal appearance.  HENT:     Head: Normocephalic.     Nose: Nose normal.     Mouth/Throat:     Mouth: Mucous membranes are  moist.  Eyes:     Extraocular Movements: Extraocular movements intact.  Cardiovascular:     Rate and Rhythm: Normal rate.     Pulses: Normal pulses.  Pulmonary:     Effort: Pulmonary effort is normal.  Musculoskeletal:        General: Normal range of motion.     Cervical back: Normal range of motion.  Skin:    General: Skin is warm and dry.     Comments: ~3 cm diameter palpable fluctuance on L anterior shin with surrounding erythema, mild warmth, no significant induration Several small areas of redness around hair follicles of knees and forearms consistent with folliculitis  Neurological:     General: No focal deficit present.     Mental Status: He is alert and oriented to person, place, and time.  Psychiatric:        Mood and Affect: Mood normal.        Behavior: Behavior normal.     ED Results / Procedures / Treatments   Labs (all labs ordered are listed, but only abnormal results are displayed) Labs Reviewed  BASIC METABOLIC PANEL - Abnormal; Notable for the following components:      Result Value   Glucose, Bld 371 (*)    Calcium 8.6 (*)    All other components within normal limits  CBC WITH DIFFERENTIAL/PLATELET    EKG None  Radiology No results found.  Procedures .Marland KitchenIncision and Drainage  Date/Time: 11/18/2022 8:05 PM  Performed by: Rexford Maus, DO Authorized by: Rexford Maus, DO   Consent:    Consent obtained:  Verbal   Consent given by:  Patient   Risks, benefits, and alternatives were discussed: yes     Risks discussed:  Bleeding, incomplete drainage and pain   Alternatives discussed:  No treatment and delayed treatment Universal protocol:    Patient identity confirmed:  Verbally with patient Location:    Type:  Abscess   Size:  2x2x1 cm   Location:  Lower extremity   Lower extremity location:  Leg   Leg location:  L lower leg Pre-procedure details:    Skin preparation:  Chlorhexidine with alcohol Sedation:    Sedation type:   None Anesthesia:    Anesthesia method:  Local infiltration   Local anesthetic:  Lidocaine 1% w/o epi Procedure type:    Complexity:  Simple Procedure details:    Ultrasound guidance: yes     Needle aspiration: no     Incision types:  Stab incision   Incision depth:  Dermal   Wound management:  Probed and deloculated   Drainage:  Bloody and purulent   Drainage amount:  Moderate  Wound treatment:  Wound left open   Packing materials:  None Post-procedure details:    Procedure completion:  Tolerated well, no immediate complications     Medications Ordered in ED Medications  lidocaine (PF) (XYLOCAINE) 1 % injection 5 mL (5 mLs Infiltration Given 11/18/22 1907)  cephALEXin (KEFLEX) capsule 500 mg (500 mg Oral Given 11/18/22 1855)  doxycycline (VIBRA-TABS) tablet 100 mg (100 mg Oral Given 11/18/22 1855)  insulin aspart (novoLOG) injection 5 Units (5 Units Subcutaneous Given 11/18/22 1855)    ED Course/ Medical Decision Making/ A&P                             Medical Decision Making This patient presents to the ED with chief complaint(s) of L shin abscess with pertinent past medical history of DM, HTN which further complicates the presenting complaint. The complaint involves an extensive differential diagnosis and also carries with it a high risk of complications and morbidity.    The differential diagnosis includes abscess, cellulitis, folliculitis, no signs of necrotizing fasciitis   Additional history obtained: Additional history obtained from spouse Records reviewed Primary Care Documents  ED Course and Reassessment: On patient's arrival to the emergency department he is hemodynamically stable in no acute distress.  He was initially evaluated by triage and had labs performed that showed hyperglycemia without evidence of DKA.  The patient will be given 5 units of insulin.  The patient's shin does appear consistent with an abscess with surrounding cellulitis.  The patient will require  I&D as well as antibiotics.  Independent labs interpretation:  The following labs were independently interpreted: hyperglycemia, no evidence of DKA  Independent visualization of imaging: - N/A  Consultation: - Consulted or discussed management/test interpretation w/ external professional: N/A  Consideration for admission or further workup: Patient has no emergent conditions requiring admission or further work-up at this time and is stable for discharge home with primary care follow-up  Social Determinants of health: N/A    Amount and/or Complexity of Data Reviewed Labs: ordered.  Risk Prescription drug management.          Final Clinical Impression(s) / ED Diagnoses Final diagnoses:  Hyperglycemia  Abscess  Folliculitis    Rx / DC Orders ED Discharge Orders          Ordered    cephALEXin (KEFLEX) 500 MG capsule  4 times daily        11/18/22 2010    doxycycline (VIBRAMYCIN) 100 MG capsule  2 times daily        11/18/22 2010    mupirocin cream (BACTROBAN) 2 %  2 times daily        11/18/22 2010              Rexford Maus, DO 11/18/22 2013

## 2022-11-19 ENCOUNTER — Other Ambulatory Visit: Payer: Self-pay

## 2022-11-26 ENCOUNTER — Encounter (HOSPITAL_COMMUNITY): Payer: Self-pay

## 2022-11-26 ENCOUNTER — Ambulatory Visit (HOSPITAL_COMMUNITY)
Admission: EM | Admit: 2022-11-26 | Discharge: 2022-11-26 | Disposition: A | Discharge status: Home / Self Care | Payer: Self-pay | Attending: Physician Assistant | Admitting: Physician Assistant

## 2022-11-26 DIAGNOSIS — R21 Rash and other nonspecific skin eruption: Secondary | ICD-10-CM | POA: Insufficient documentation

## 2022-11-26 LAB — CBC WITH DIFFERENTIAL/PLATELET
Abs Immature Granulocytes: 0.03 10*3/uL (ref 0.00–0.07)
Basophils Absolute: 0.1 10*3/uL (ref 0.0–0.1)
Basophils Relative: 1 %
Eosinophils Absolute: 0.2 10*3/uL (ref 0.0–0.5)
Eosinophils Relative: 3 %
HCT: 49.6 % (ref 39.0–52.0)
Hemoglobin: 16.3 g/dL (ref 13.0–17.0)
Immature Granulocytes: 1 %
Lymphocytes Relative: 28 %
Lymphs Abs: 1.6 10*3/uL (ref 0.7–4.0)
MCH: 29.1 pg (ref 26.0–34.0)
MCHC: 32.9 g/dL (ref 30.0–36.0)
MCV: 88.4 fL (ref 80.0–100.0)
Monocytes Absolute: 0.5 10*3/uL (ref 0.1–1.0)
Monocytes Relative: 8 %
Neutro Abs: 3.3 10*3/uL (ref 1.7–7.7)
Neutrophils Relative %: 59 %
Platelets: 236 10*3/uL (ref 150–400)
RBC: 5.61 MIL/uL (ref 4.22–5.81)
RDW: 13.2 % (ref 11.5–15.5)
WBC: 5.7 10*3/uL (ref 4.0–10.5)
nRBC: 0 % (ref 0.0–0.2)

## 2022-11-26 LAB — COMPREHENSIVE METABOLIC PANEL
ALT: 25 U/L (ref 0–44)
AST: 20 U/L (ref 15–41)
Albumin: 3.8 g/dL (ref 3.5–5.0)
Alkaline Phosphatase: 73 U/L (ref 38–126)
Anion gap: 9 (ref 5–15)
BUN: 10 mg/dL (ref 6–20)
CO2: 28 mmol/L (ref 22–32)
Calcium: 9.2 mg/dL (ref 8.9–10.3)
Chloride: 98 mmol/L (ref 98–111)
Creatinine, Ser: 0.71 mg/dL (ref 0.61–1.24)
GFR, Estimated: >60 mL/min (ref >60)
Glucose, Bld: 262 mg/dL — ABNORMAL HIGH (ref 70–99)
Potassium: 4.6 mmol/L (ref 3.5–5.1)
Sodium: 135 mmol/L (ref 135–145)
Total Bilirubin: 0.6 mg/dL (ref 0.3–1.2)
Total Protein: 7.8 g/dL (ref 6.5–8.1)

## 2022-11-26 LAB — SEDIMENTATION RATE: Sed Rate: 5 mm/hr (ref 0–16)

## 2022-11-26 LAB — C-REACTIVE PROTEIN: CRP: 0.5 mg/dL (ref <1.0)

## 2022-11-26 NOTE — Discharge Instructions (Addendum)
I am not exactly sure what is causing your symptoms as we discussed.  It is possible that this is shingles which is a virus related to chickenpox.  Because this has been going on for over a week we do not need to start medication and if that is the case it should begin to improve.  Use over-the-counter medicine such as Tylenol and ibuprofen for pain.  We have drawn some blood work and if any of this is abnormal we will contact you.  Please call and schedule an appointment with dermatology as soon as possible.  I would like someone to reassess you in a few days.  If you cannot see your primary care in that timeframe please return here.  If anything worsens and you have spread of rash, fever, nausea, vomiting you need to go to the emergency room immediately.  No estoy exactamente seguro de qu est causando sus sntomas como comentamos.  Es posible que se trate de [place], que es un virus relacionado con la varicela.  Debido a que esto ha [employer] sucediendo durante ms de una semana, no necesitamos comenzar con la medicacin y, si ese es el caso, debera comenzar a Scientist, clinical (histocompatibility and immunogenetics).  Utilice medicamentos de venta libre, como Tylenol e ibuprofeno, para Chief Technology Officer.  Nos hemos hecho algunos anlisis de sangre y si algo de esto es anormal nos comunicaremos con usted.  Por favor llame y programe una cita con dermatologa lo antes posible.  Me gustara que alguien te [facility].  Si no puede ver su atencin primaria en ese perodo, regrese aqu.  Si algo empeora y tiene sarpullido, fiebre, nuseas o vmitos, debe acudir a la sala de emergencias de inmediato.

## 2022-11-26 NOTE — ED Triage Notes (Signed)
Pt c/o red painful rash to rt hand/arm x1wk. States taking OTC with no relief.

## 2022-11-26 NOTE — ED Provider Notes (Signed)
[facility]    CSN: [id number] Arrival date & time: 11/26/22  1210      History   Chief Complaint Chief Complaint  Patient presents with   Rash    HPI Benjamin Thompson is a 52 y.o. male.   Patient presents today companied by his daughter help provide translation after declining video translator.  Reports a 1 week history of painful rash to the right hand and arm.  Denies any spread of lesion or additional involvement of extremities or trunk.  He has not tried any over-the-counter medications for symptom management.  He was seen in the emergency room 11/18/2022 at which point he had I&D performed of abscess on his left leg.  He was treated with doxycycline and cephalexin which she has completed.  Denies additional antibiotics.  Does report that he had septic arthritis of sternoclavicular joint last year but states he does not feel as poorly as he did during that episode.  He does not use IV drugs.  He denies any systemic symptoms including fever, nausea, vomiting.    Past Medical History:  Diagnosis Date   Diabetes mellitus without complication (HCC)    Hypertension     Patient Active Problem List   Diagnosis Date Noted   Abscess re-check 02/01/2022   Dressing change 01/12/2022   Diabetic peripheral neuropathy (HCC) 12/30/2021   Healthcare maintenance 12/30/2021   Septic arthritis of sternoclavicular joint, left (HCC) 11/04/2021   Liver abscess 10/19/2021   Essential hypertension 10/15/2021   Mixed hyperlipidemia 10/15/2021   Type II diabetes mellitus (HCC) 03/07/2008   MICROSCOPIC HEMATURIA 03/07/2008   GERD (gastroesophageal reflux disease) 01/25/2008    Past Surgical History:  Procedure Laterality Date   APPENDECTOMY     APPLICATION OF WOUND VAC Left 11/06/2021   Procedure: APPLICATION OF WOUND VAC;  Surgeon: Lovett Sox, MD;  Location: [facility] OR;  Service: Vascular;  Laterality: Left;   IRRIGATION AND DEBRIDEMENT ABSCESS Left 11/06/2021   Procedure:  IRRIGATION AND DEBRIDEMENT STERNOCLAVICULAR JOINT ABSCESS;  Surgeon: Lovett Sox, MD;  Location: [facility] OR;  Service: Vascular;  Laterality: Left;       Home Medications    Prior to Admission medications   Medication Sig Start Date End Date Taking? Authorizing Provider  amLODipine (NORVASC) 5 MG tablet Take 1 tablet (5 mg total) by mouth daily. Please schedule appointment with Dr. Alvis Lemmings. 10/13/22  Yes Hoy Register, MD  acetaminophen (TYLENOL) 500 MG tablet Take 1 tablet (500 mg total) by mouth every 6 (six) hours as needed for moderate pain. 11/27/21   Nooruddin, Jason Fila, MD  atorvastatin (LIPITOR) 20 MG tablet Take 1 tablet (20 mg total) by mouth daily. 03/23/22   Hoy Register, MD  Blood Glucose Monitoring Suppl (ACCU-CHEK GUIDE) w/Device KIT Use As Directed 10/20/21   Elgergawy, Leana Roe, MD  cephALEXin (KEFLEX) 500 MG capsule Take 1 capsule (500 mg total) by mouth 4 (four) times daily. 11/18/22   Rexford Maus, DO  doxycycline (VIBRAMYCIN) 100 MG capsule Take 1 capsule (100 mg total) by mouth 2 (two) times daily for 7 days. 11/18/22 11/26/22  Elayne Snare K, DO  empagliflozin (JARDIANCE) 10 MG TABS tablet Take 1 tablet (10 mg total) by mouth daily. 03/23/22   Hoy Register, MD  gabapentin (NEURONTIN) 300 MG capsule Take 1 capsule (300 mg total) by mouth at bedtime. 03/23/22   Hoy Register, MD  glucose blood test strip Use test strip to check sugar every morning. 02/01/22   Evlyn Kanner, MD  Insulin  Glargine (BASAGLAR KWIKPEN) 100 UNIT/ML Inject 40 Units into the skin daily. 03/23/22 10/19/22  Hoy Register, MD  Insulin Pen Needle 32G X 4 MM MISC Use As Directed 10/20/21   Elgergawy, Leana Roe, MD  metFORMIN (GLUCOPHAGE) 500 MG tablet Take 2 tablets (1,000 mg total) by mouth 2 (two) times daily with a meal. 03/23/22   Hoy Register, MD  Microlet Lancets MISC Use to check sugar every morning 02/01/22   Evlyn Kanner, MD  mupirocin cream (BACTROBAN) 2 % Apply 1 Application  topically 2 (two) times daily. 11/18/22   Rexford Maus, DO  sildenafil (VIAGRA) 50 MG tablet Take 1 tablet (50 mg total) by mouth daily as needed for erectile dysfunction. At Least 24 hours between doses 03/23/22   Hoy Register, MD  tiZANidine (ZANAFLEX) 4 MG tablet Take 1 tablet (4 mg total) by mouth every 8 (eight) hours as needed for muscle spasms. Do not take with alcohol or while driving or operating heavy machinery.  May cause drowsiness. 08/13/22   Valentino Nose, NP  triamcinolone ointment (KENALOG) 0.1 % Apply 1 Application topically 2 (two) times daily. Use sparingly twice daily as needed for itching; do not use for more than 2 weeks in a row. 08/13/22   Valentino Nose, NP    Family History Family History  Problem Relation Age of Onset   Healthy Mother    Hypertension Brother    Diabetes Brother    Heart disease Neg Hx     Social History Social History   Tobacco Use   Smoking status: Never   Smokeless tobacco: Never  Vaping Use   Vaping status: Never Used  Substance Use Topics   Alcohol use: Not Currently    Comment: little   Drug use: No     Allergies   Patient has no known allergies.   Review of Systems Review of Systems  Constitutional:  Negative for activity change, appetite change, fatigue and fever.  Respiratory:  Negative for shortness of breath.   Cardiovascular:  Negative for chest pain, palpitations and leg swelling.  Gastrointestinal:  Negative for abdominal pain, diarrhea, nausea and vomiting.  Musculoskeletal:  Negative for arthralgias and myalgias.  Skin:  Positive for rash.     Physical Exam Triage Vital Signs ED Triage Vitals  Encounter Vitals Group     BP 11/26/22 1324 (!) 174/100     Systolic BP Percentile --      Diastolic BP Percentile --      Pulse Rate 11/26/22 1324 68     Resp 11/26/22 1324 18     Temp 11/26/22 1324 98.3 F (36.8 C)     Temp Source 11/26/22 1324 Oral     SpO2 11/26/22 1324 98 %     Weight --       Height --      Head Circumference --      Peak Flow --      Pain Score 11/26/22 1325 9     Pain Loc --      Pain Education --      Exclude from Growth Chart --    No data found.  Updated Vital Signs BP (!) 174/100 (BP Location: Left Arm)   Pulse 68   Temp 98.3 F (36.8 C) (Oral)   Resp 18   SpO2 98%   Visual Acuity Right Eye Distance:   Left Eye Distance:   Bilateral Distance:    Right Eye Near:   Left Eye Near:  Bilateral Near:     Physical Exam Vitals reviewed.  Constitutional:      General: He is awake.     Appearance: Normal appearance. He is well-developed. He is not ill-appearing.     Comments: Very pleasant male appears stated age in no acute distress sitting comfortably in exam room  HENT:     Head: Normocephalic and atraumatic.     Mouth/Throat:     Pharynx: No oropharyngeal exudate, posterior oropharyngeal erythema or uvula swelling.  Cardiovascular:     Rate and Rhythm: Normal rate and regular rhythm.     Heart sounds: Normal heart sounds, S1 normal and S2 normal. No murmur heard.    Comments: No murmur auscultated on exam Pulmonary:     Effort: Pulmonary effort is normal.     Breath sounds: Normal breath sounds. No stridor. No wheezing, rhonchi or rales.     Comments: Clear to auscultation bilaterally Abdominal:     General: Bowel sounds are normal.     Palpations: Abdomen is soft.     Tenderness: There is no abdominal tenderness.  Skin:    Findings: Rash is not vesicular.     Comments: Painful erythematous nodules noted right palmar hand with nonblanching erythematous nodules extending into ulnar arm and medial upper arm.  No splinter hemorrhages noted on exam.  No obvious vesicles.  Neurological:     Mental Status: He is alert.  Psychiatric:        Behavior: Behavior is cooperative.           UC Treatments / Results  Labs (all labs ordered are listed, but only abnormal results are displayed) Labs Reviewed  CBC WITH  DIFFERENTIAL/PLATELET  COMPREHENSIVE METABOLIC PANEL  SEDIMENTATION RATE  C-REACTIVE PROTEIN  RPR    EKG   Radiology No results found.  Procedures Procedures (including critical care time)  Medications Ordered in UC Medications - No data to display  Initial Impression / Assessment and Plan / UC Course  I have reviewed the triage vital signs and the nursing notes.  Pertinent labs & imaging results that were available during my care of the patient were reviewed by me and considered in my medical decision making (see chart for details).     Patient is well-appearing, afebrile, nontoxic, nontachycardic.  He has painful rash involving his right arm.  We did discuss concern for endocarditis given characteristics of rash involving palmar hands which are similar to [place] nodes, however, he is not febrile, does not have a murmur on exam, with only unilateral presentation of symptoms making endocarditis much less likely.  Will obtain CBC, CMP, ESR, CRP.  If these are abnormal he will need to go to emergency room.  His symptoms do loosely follow C8 dermatome distribution and discussed that it could be shingles.  He has been symptomatic for over a week and so is unlikely to benefit from antiviral therapy.  Recommended over-the-counter medications to help manage pain.  Contacted Dr. Leonides Grills and discussed case.  He was also concerned for vasculitis and we will consider prednisone if ESR/CRP are significantly elevated.  He would need to follow-up with dermatology for possible biopsy and he was given contact information for local provider with instruction to call to schedule an appointment as soon as possible.  Also recommend close follow-up with his primary care.  Discussed that ideally he should be reevaluated within a few days to ensure that rash has not spread and he has not developed any other concerning signs/symptoms including  fever or murmur.  If he cannot see his primary care he can return here.   Discussed that if he develops any fever, spread of rash, additional symptoms he must go to the emergency room to which he and family expressed understanding.  Final Clinical Impressions(s) / UC Diagnoses   Final diagnoses:  Papular rash     Discharge Instructions      I am not exactly sure what is causing your symptoms as we discussed.  It is possible that this is shingles which is a virus related to chickenpox.  Because this has been going on for over a week we do not need to start medication and if that is the case it should begin to improve.  Use over-the-counter medicine such as Tylenol and ibuprofen for pain.  We have drawn some blood work and if any of this is abnormal we will contact you.  Please call and schedule an appointment with dermatology as soon as possible.  I would like someone to reassess you in a few days.  If you cannot see your primary care in that timeframe please return here.  If anything worsens and you have spread of rash, fever, nausea, vomiting you need to go to the emergency room immediately.  No estoy exactamente seguro de qu est causando sus sntomas como comentamos.  Es posible que se trate de [place], que es un virus relacionado con la varicela.  Debido a que esto ha [employer] sucediendo durante ms de una semana, no necesitamos comenzar con la medicacin y, si ese es el caso, debera comenzar a Scientist, clinical (histocompatibility and immunogenetics).  Utilice medicamentos de venta libre, como Tylenol e ibuprofeno, para Chief Technology Officer.  Nos hemos hecho algunos anlisis de sangre y si algo de esto es anormal nos comunicaremos con usted.  Por favor llame y programe una cita con dermatologa lo antes posible.  Me gustara que alguien te [employer] [facility].  Si no puede ver su atencin primaria en ese perodo, regrese aqu.  Si algo empeora y tiene sarpullido, fiebre, nuseas o vmitos, debe acudir a la sala de emergencias de inmediato.     ED Prescriptions   None    PDMP not reviewed this encounter.   Jeani Hawking, PA-C 11/26/22 1431

## 2022-11-27 LAB — RPR: RPR Ser Ql: NONREACTIVE

## 2023-01-19 ENCOUNTER — Other Ambulatory Visit: Payer: Self-pay | Admitting: Family Medicine

## 2023-01-19 ENCOUNTER — Other Ambulatory Visit: Payer: Self-pay

## 2023-01-19 DIAGNOSIS — E1165 Type 2 diabetes mellitus with hyperglycemia: Secondary | ICD-10-CM

## 2023-01-19 DIAGNOSIS — I10 Essential (primary) hypertension: Secondary | ICD-10-CM

## 2023-01-19 MED ORDER — AMLODIPINE BESYLATE 5 MG PO TABS
5.0000 mg | ORAL_TABLET | Freq: Every day | ORAL | 0 refills | Status: DC
Start: 2023-01-19 — End: 2023-09-27
  Filled 2023-01-19 – 2023-04-12 (×2): qty 30, 30d supply, fill #0

## 2023-01-19 MED ORDER — METFORMIN HCL 500 MG PO TABS
1000.0000 mg | ORAL_TABLET | Freq: Two times a day (BID) | ORAL | 0 refills | Status: DC
Start: 2023-01-19 — End: 2023-09-27
  Filled 2023-01-19 – 2023-04-12 (×2): qty 120, 30d supply, fill #0

## 2023-01-25 ENCOUNTER — Other Ambulatory Visit: Payer: Self-pay

## 2023-04-04 ENCOUNTER — Ambulatory Visit: Payer: Self-pay | Admitting: Dermatology

## 2023-04-12 ENCOUNTER — Other Ambulatory Visit: Payer: Self-pay | Admitting: Family Medicine

## 2023-04-12 ENCOUNTER — Other Ambulatory Visit: Payer: Self-pay

## 2023-04-12 DIAGNOSIS — E1165 Type 2 diabetes mellitus with hyperglycemia: Secondary | ICD-10-CM

## 2023-04-13 ENCOUNTER — Other Ambulatory Visit: Payer: Self-pay

## 2023-05-23 IMAGING — CT CT CHEST W/ CM
2 of 3 series · 15 of 36 positions shown, 18 images · IV contrast (agent unspecified)
Comparison: 10/15/2021

CLINICAL DATA: Pain and swelling left chest wall.

EXAM:
CT CHEST WITH CONTRAST
TECHNIQUE: Multidetector CT imaging of the chest was performed during
intravenous contrast administration.

[Series 2: axial st · axial · 0.74mm/px · z∈[+1276,+1572]mm · 12 of 174 slices shown, 15 images]
[im 13/174  mediastinal]
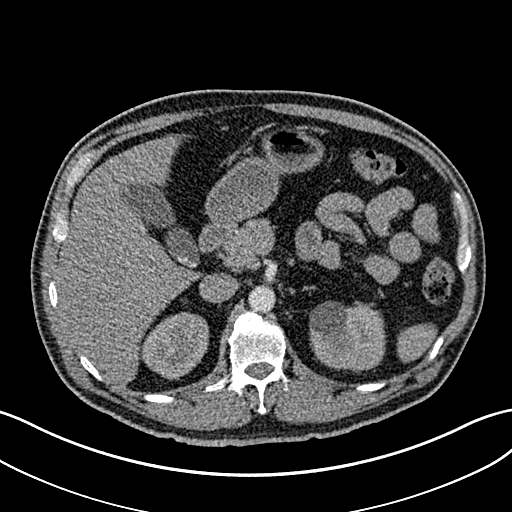
[im 13/174  lung]
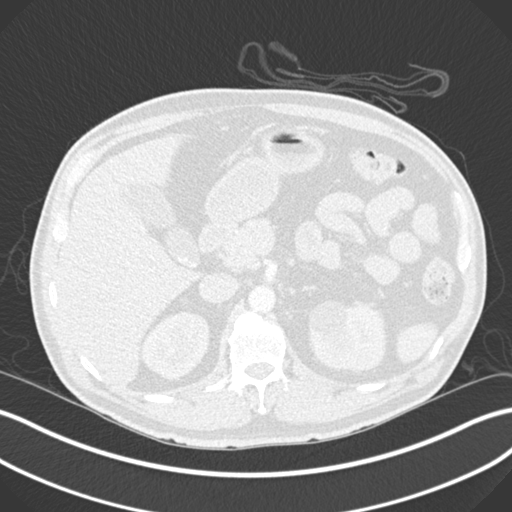
[im 26/174  lung]
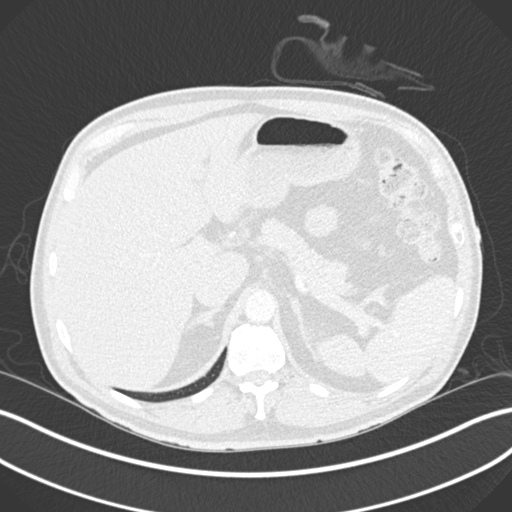
[im 39/174  lung]
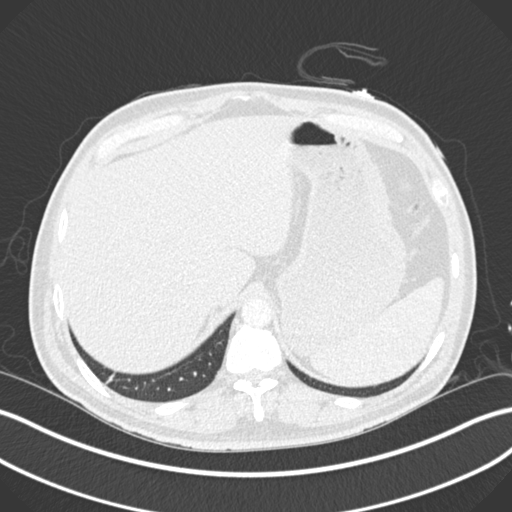
[im 52/174  lung]
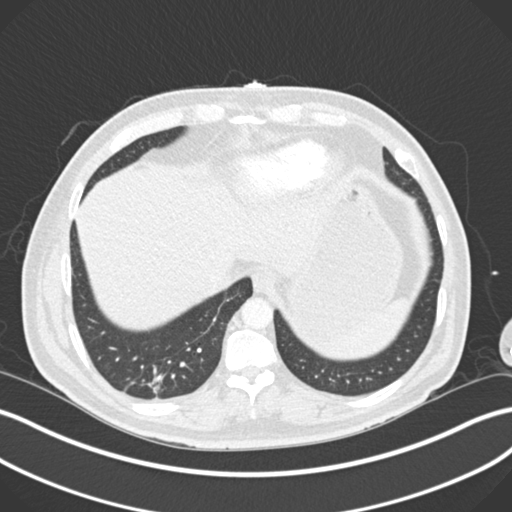
[im 65/174  mediastinal]
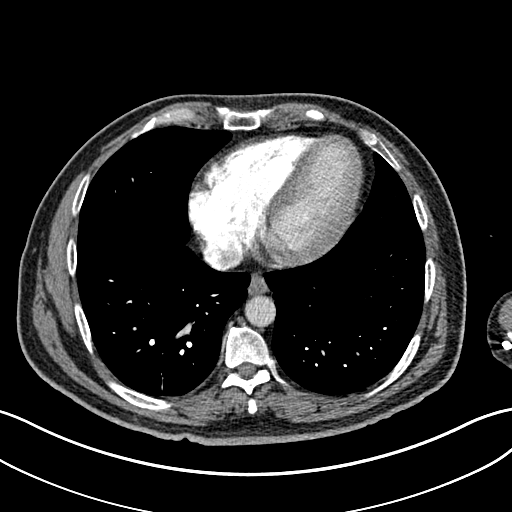
[im 65/174  lung]
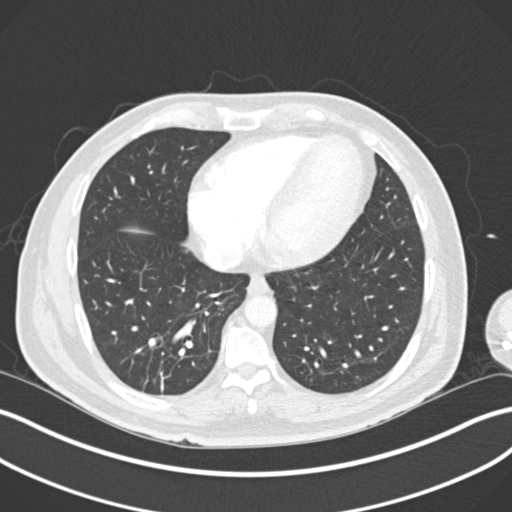
[im 77/174  lung]
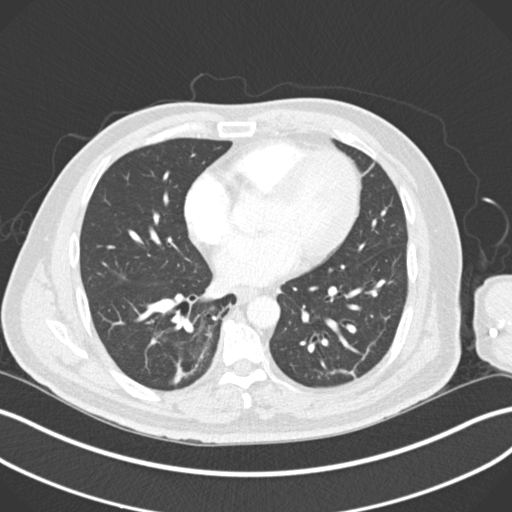
[im 97/174  lung]
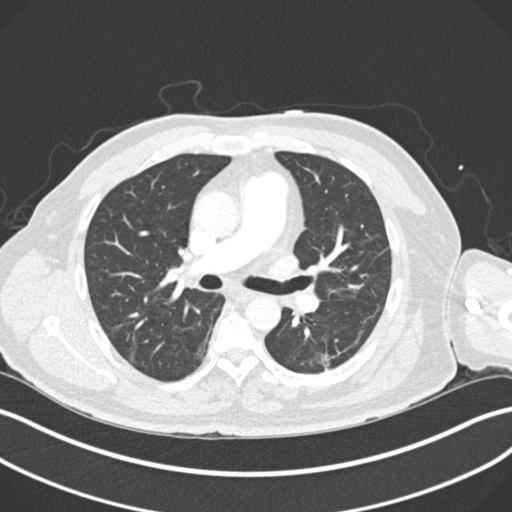
[im 109/174  lung]
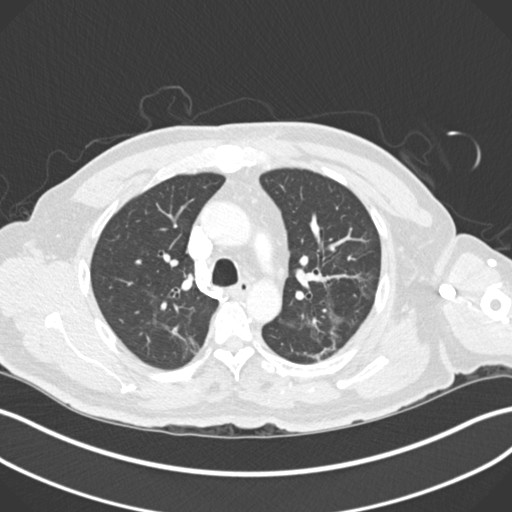
[im 122/174  mediastinal]
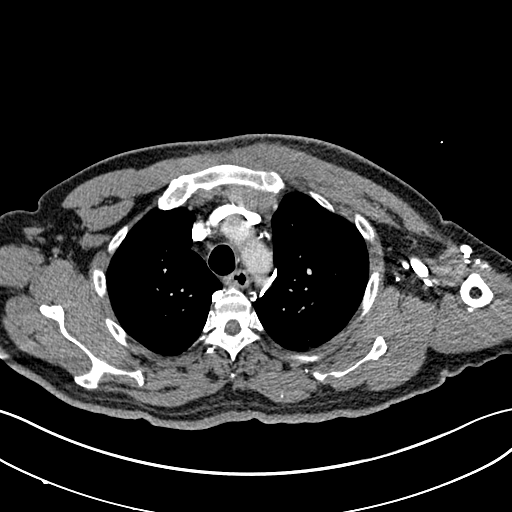
[im 122/174  lung]
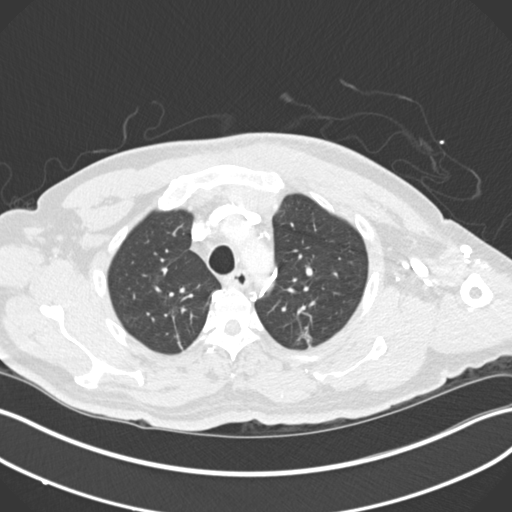
[im 135/174  lung]
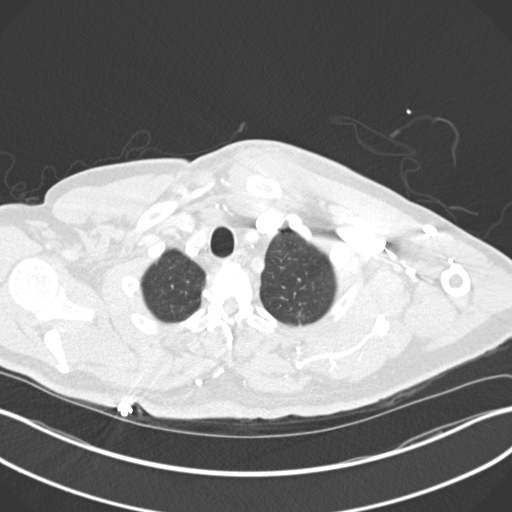
[im 148/174  lung]
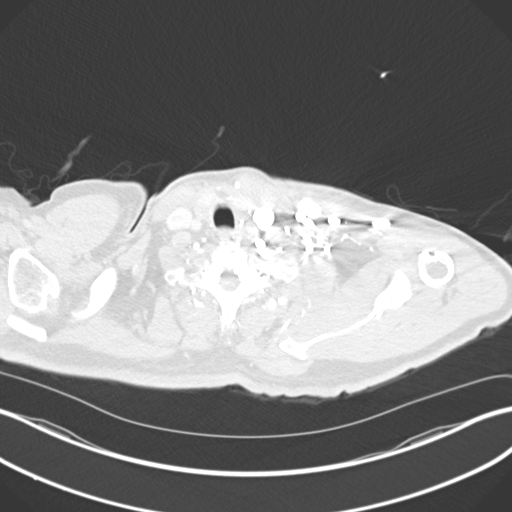
[im 161/174  lung]
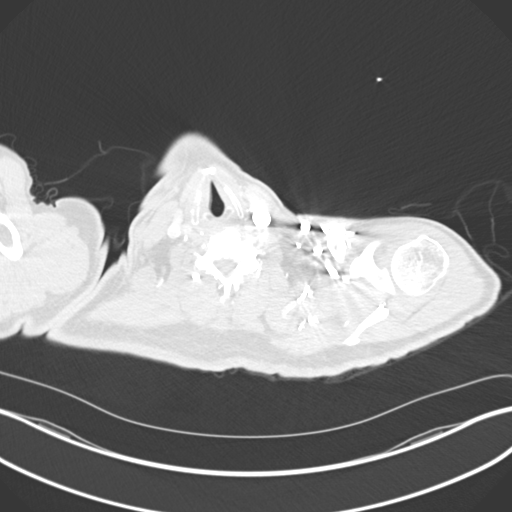

[Series 4: coronal · coronal · 0.73mm/px · 3 of 132 slices shown]
[im 27/132  lung]
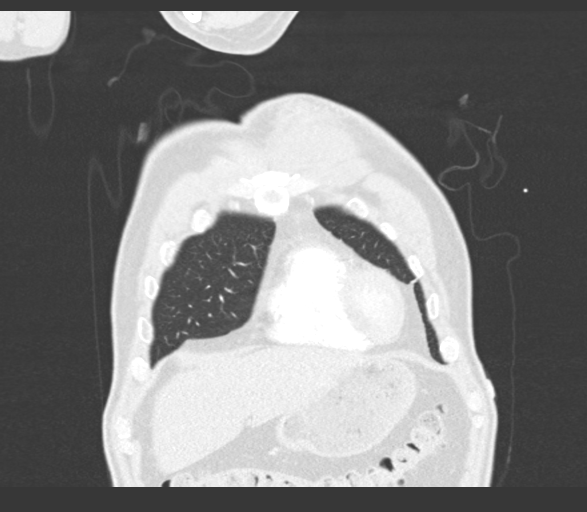
[im 53/132  lung]
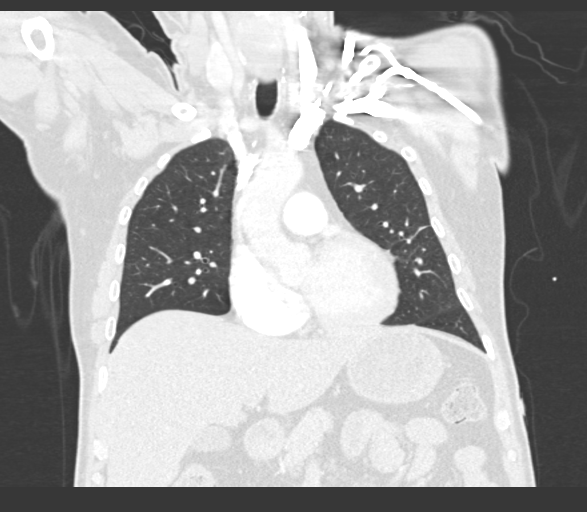
[im 79/132  lung]
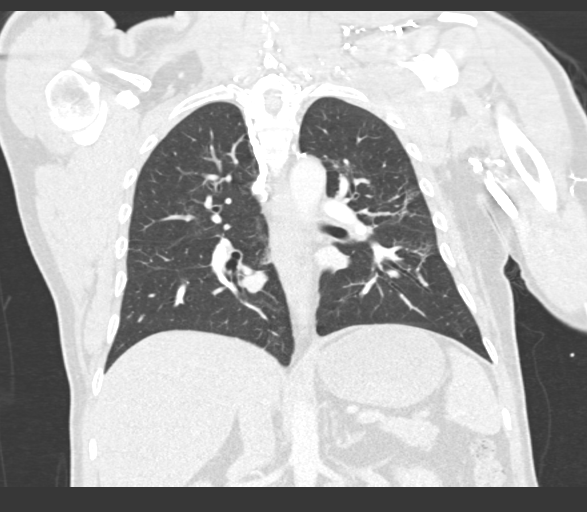

[15 of 36 positions shown; findings below may reference images not displayed]

RADIATION DOSE REDUCTION: This exam was performed according to the
departmental dose-optimization program which includes automated
exposure control, adjustment of the mA and/or kV according to
patient size and/or use of iterative reconstruction technique.

CONTRAST:  75mL OMNIPAQUE IOHEXOL 300 MG/ML  SOLN
FINDINGS: Cardiovascular: There is homogeneous enhancement in thoracic aorta.
There are no intraluminal filling defects in the pulmonary artery
branches.

Mediastinum/Nodes: No significant lymphadenopathy seen in
mediastinum. There is soft tissue swelling around the left
sternoclavicular joint. There are lytic lesions in the medial end of
left clavicle and sternum adjacent to the left sternoclavicular
joint. There is no loculated fluid collection. There are no pockets
of air in the soft tissues.

Lungs/Pleura: There are small linear patchy densities in both
parahilar regions and lower lung fields with interval improvement.
There are no new focal infiltrates. There is no pleural effusion or
pneumothorax.

Upper Abdomen: Gallbladder stones are seen. There is no dilation of
bile ducts. There is 2.8 cm cyst in the medial upper pole of left
kidney.

Musculoskeletal: Lytic lesions are seen in the medial end of left
clavicle and sternum adjacent to the left sternoclavicular joint.
There is soft tissue swelling around the left sternoclavicular joint
without definite loculated fluid collection.
IMPRESSION: There is new lytic lesion in the medial end of left clavicle and
manubrium sternum adjacent to the left sternoclavicular joint.
Findings suggest possible septic arthritis. There is soft tissue
swelling around the left sternoclavicular joint without demonstrable
drainable abscess.

There is no evidence of pulmonary artery embolism. There is no
evidence of thoracic aortic dissection. There is interval partial
clearing of linear and ground-glass infiltrates in both lungs
suggesting resolving pneumonia. No new infiltrates are seen. There
is no pleural effusion.

Gallbladder stones.  Left renal cyst.

Imaging finding of new septic arthritis in the left sternoclavicular
joint was relayed to Dr. Ca by telephone call.

## 2023-05-24 IMAGING — CT CT NECK W/ CM
4 of 5 series · 14 of 33 positions shown, 16 images · IV contrast (agent unspecified)
Comparison: CT chest 1 day prior

CLINICAL DATA: Bacteremia, neck pain with sternoclavicular
osteomyelitis

EXAM:
CT NECK WITH CONTRAST
TECHNIQUE: Multidetector CT imaging of the neck was performed using the
standard protocol following the bolus administration of intravenous
contrast.

[Series 4: axial bone · axial · 0.51mm/px · z∈[-226,-80]mm · 3 of 147 slices shown, 4 images]
[im 37/147  soft-tissue]
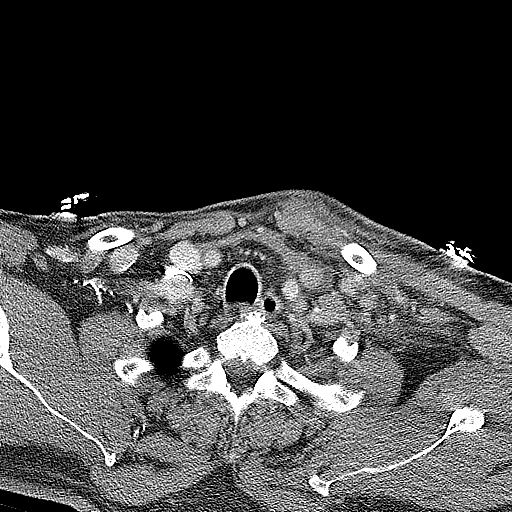
[im 37/147  bone]
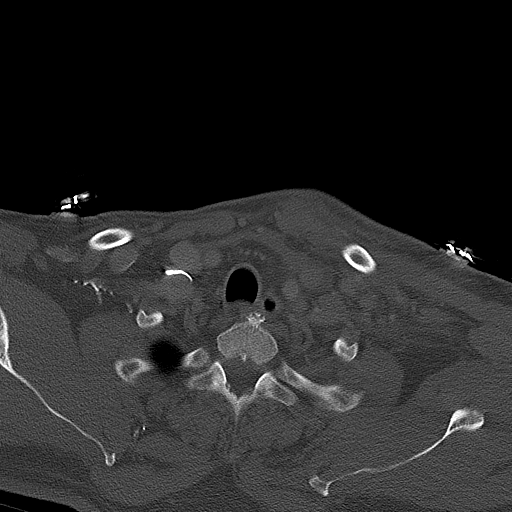
[im 74/147  bone]
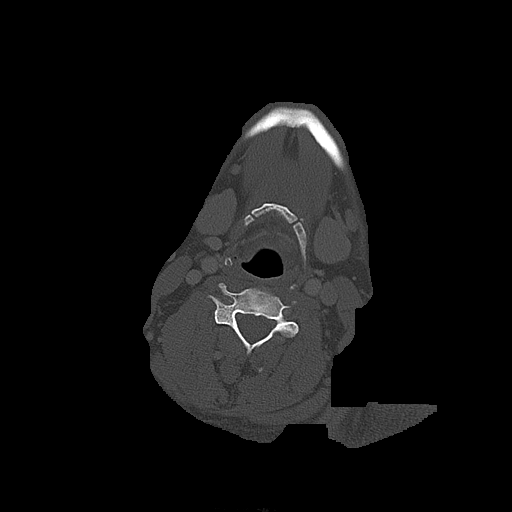
[im 110/147  bone]
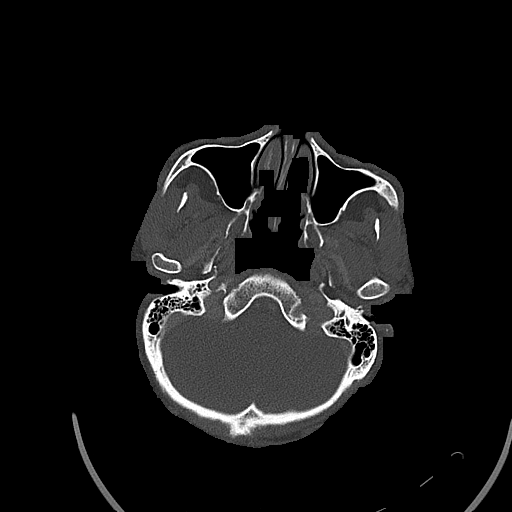

[Series 5: orthogonal (person_name) · axial · 0.48mm/px · z∈[-255,-111]mm · 3 of 147 slices shown]
[im 37/147  bone]
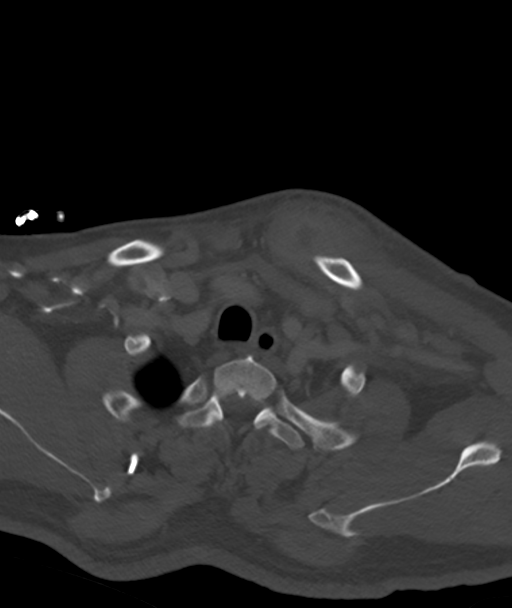
[im 74/147  bone]
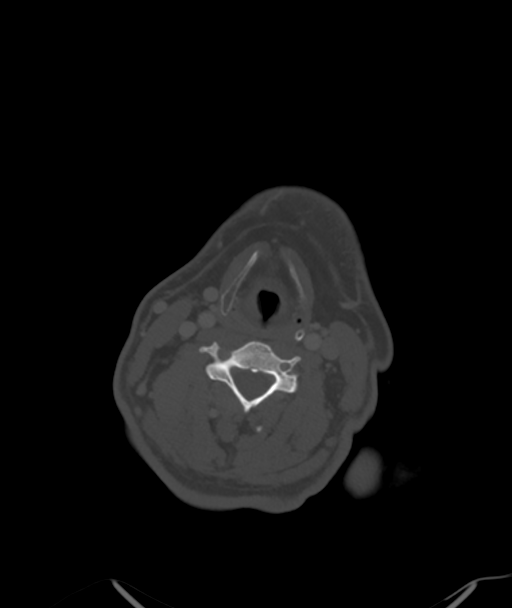
[im 110/147  bone]
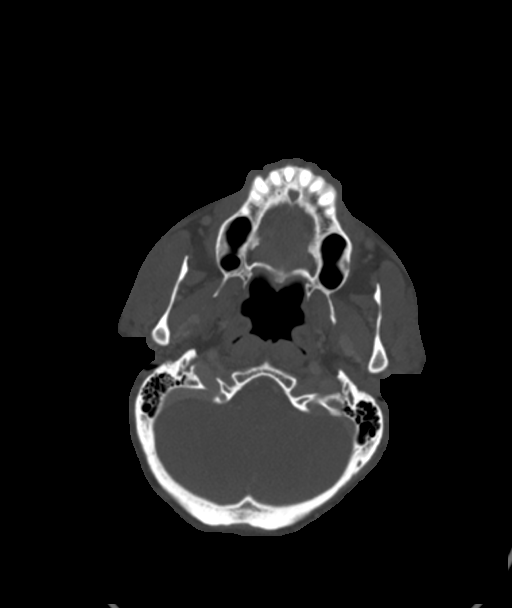

[Series 6: cor neck · coronal · 0.57mm/px · 3 of 125 slices shown]
[im 25/125  bone]
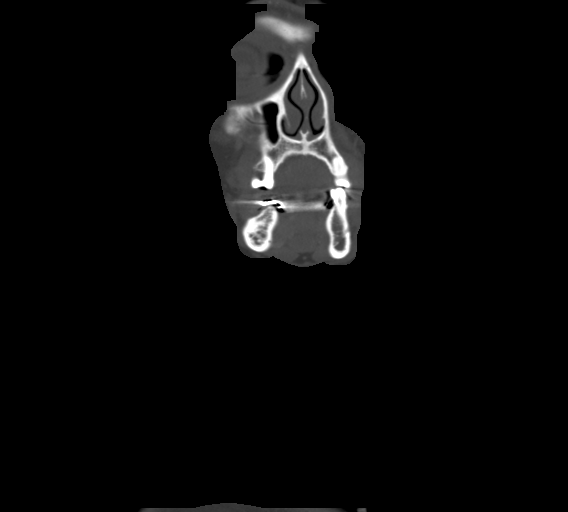
[im 50/125  bone]
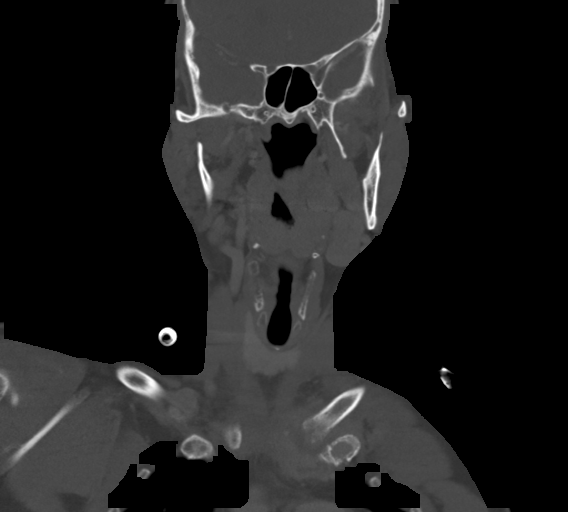
[im 75/125  bone]
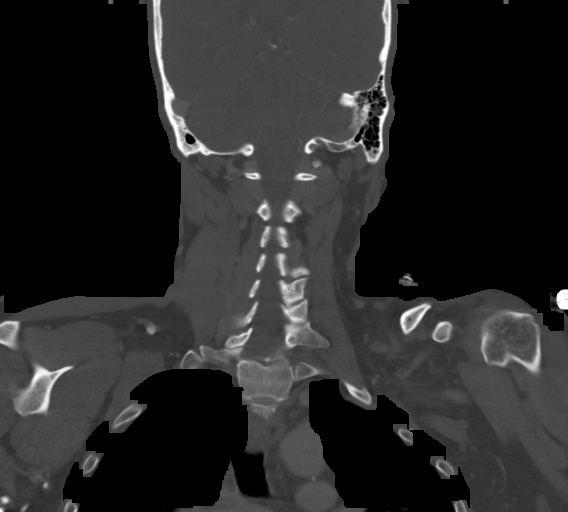

[Series 7: sag neck · sagittal · 0.49mm/px · 5 of 163 slices shown, 6 images]
[im 55/163  bone]
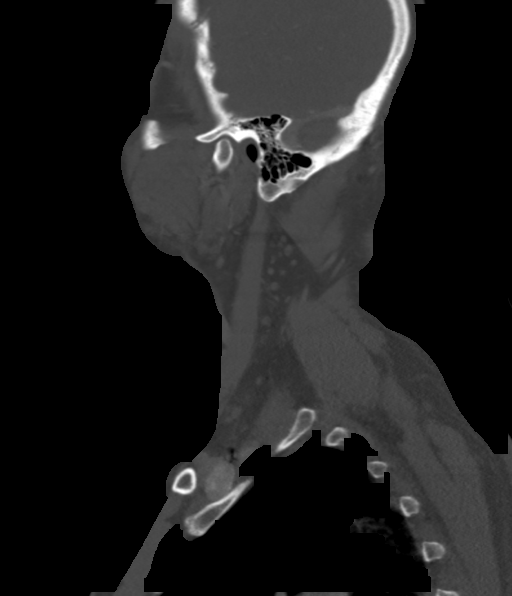
[im 68/163  bone]
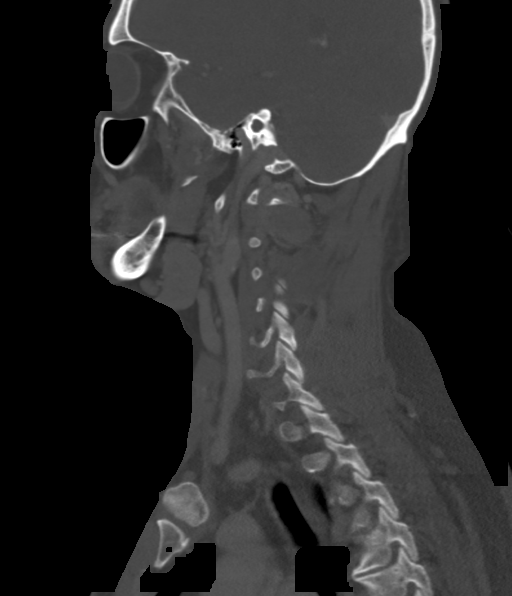
[im 82/163  soft-tissue]
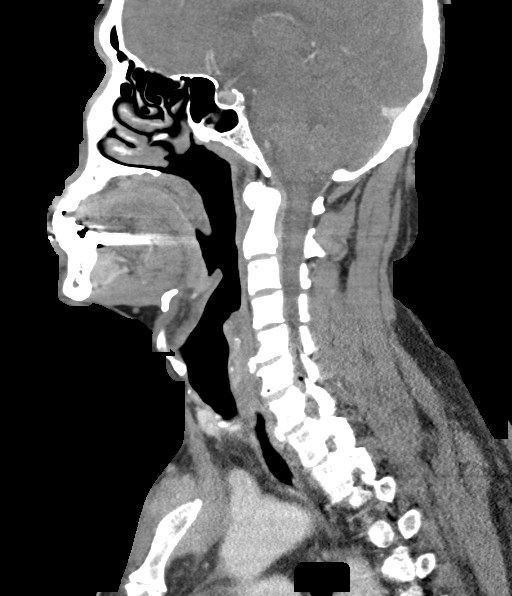
[im 82/163  bone]
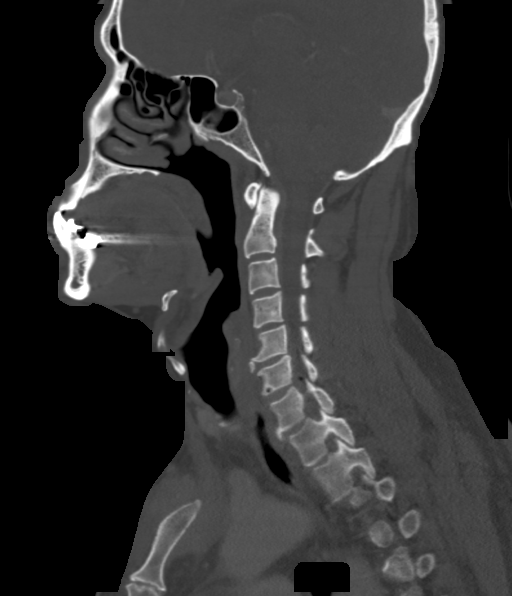
[im 95/163  bone]
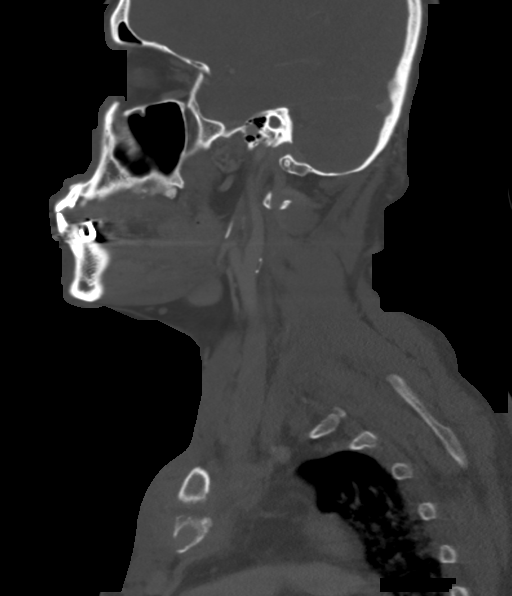
[im 109/163  bone]
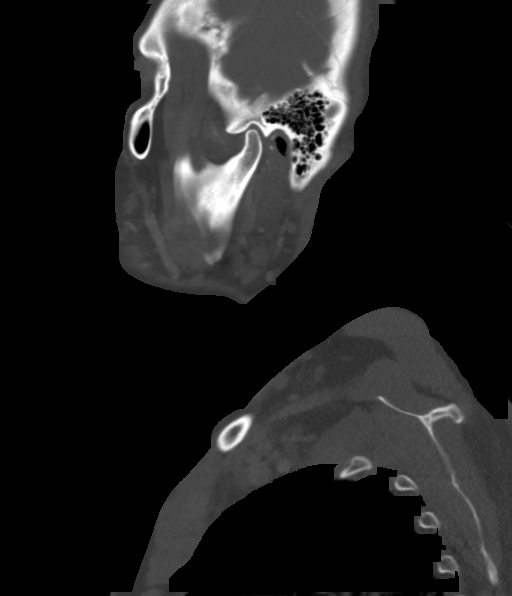

[14 of 33 positions shown; findings below may reference images not displayed]

RADIATION DOSE REDUCTION: This exam was performed according to the
departmental dose-optimization program which includes automated
exposure control, adjustment of the mA and/or kV according to
patient size and/or use of iterative reconstruction technique.

CONTRAST:  75mL OMNIPAQUE IOHEXOL 300 MG/ML  SOLN
FINDINGS: Pharynx and larynx: The nasal cavity and nasopharynx are
unremarkable. The oral cavity and oropharynx are unremarkable. The
parapharyngeal spaces are clear.

The hypopharynx and larynx are unremarkable. The vocal folds are
normal.

There is no abnormal enhancement, fluid collection, or soft tissue
lesion.

Salivary glands: The parotid and submandibular glands are
unremarkable.

Thyroid: Unremarkable.

Lymph nodes: There is no pathologic lymphadenopathy in the neck.

Vascular: There is mild calcified plaque at the carotid
bifurcations. The bilateral internal jugular veins are patent.

Limited intracranial: The imaged portions of the intracranial
compartment are unremarkable.

Visualized orbits: The globes and orbits are unremarkable.

Mastoids and visualized paranasal sinuses: The paranasal sinuses and
mastoid air cells are clear.

Skeleton: The cervical spine is unremarkable.

There is lytic change about the left sternoclavicular joint with
surrounding inflammatory change and soft tissue thickening. There is
a 1.2 cm by 1.0 cm peripherally enhancing fluid collection along the
anterior aspect of the distal left clavicle suspicious for an
abscess. There is an additional peripherally enhancing focus of
hypodensity along the inner margin of the upper sternal manubrium in
the upper mediastinum measuring 1.5 cm by 0.7 cm in the axial plane,
also suspicious for abscess (2-135). Both of these abscesses appear
contiguous with the joint space in the coronal plane (6-51, 6-42,
7-91). There is mild fat stranding in the left supraclavicular
fossa.

Upper chest: There is patchy and linear opacity in the lung apices,
similar to the CT chest from 1 day prior.

Other: None.
IMPRESSION: 1. Findings again consistent with sternoclavicular
osteomyelitis/septic arthritis with surrounding inflammatory change
and soft tissue thickening. There are small abscesses both along the
anterosuperior margin of the distal clavicle and along the posterior
surface of the upper sternal manubrium in the upper mediastinum
common both of which appear contiguous with sternoclavicular joint
space.
2. No acute pathology in the neck.

## 2023-06-20 ENCOUNTER — Other Ambulatory Visit: Payer: Self-pay

## 2023-06-20 ENCOUNTER — Other Ambulatory Visit: Payer: Self-pay | Admitting: Family Medicine

## 2023-06-20 DIAGNOSIS — E1165 Type 2 diabetes mellitus with hyperglycemia: Secondary | ICD-10-CM

## 2023-06-20 DIAGNOSIS — I10 Essential (primary) hypertension: Secondary | ICD-10-CM

## 2023-07-05 ENCOUNTER — Other Ambulatory Visit: Payer: Self-pay

## 2023-09-27 ENCOUNTER — Encounter: Payer: Self-pay | Admitting: Family Medicine

## 2023-09-27 ENCOUNTER — Ambulatory Visit: Payer: Self-pay | Attending: Family Medicine | Admitting: Family Medicine

## 2023-09-27 ENCOUNTER — Other Ambulatory Visit: Payer: Self-pay

## 2023-09-27 VITALS — BP 152/91 | HR 85 | Ht 63.0 in | Wt 152.0 lb

## 2023-09-27 DIAGNOSIS — I10 Essential (primary) hypertension: Secondary | ICD-10-CM

## 2023-09-27 DIAGNOSIS — G629 Polyneuropathy, unspecified: Secondary | ICD-10-CM

## 2023-09-27 DIAGNOSIS — Z7984 Long term (current) use of oral hypoglycemic drugs: Secondary | ICD-10-CM | POA: Diagnosis not present

## 2023-09-27 DIAGNOSIS — Z794 Long term (current) use of insulin: Secondary | ICD-10-CM | POA: Diagnosis not present

## 2023-09-27 DIAGNOSIS — G8191 Hemiplegia, unspecified affecting right dominant side: Secondary | ICD-10-CM | POA: Diagnosis not present

## 2023-09-27 DIAGNOSIS — E1165 Type 2 diabetes mellitus with hyperglycemia: Secondary | ICD-10-CM

## 2023-09-27 LAB — GLUCOSE, POCT (MANUAL RESULT ENTRY)
POC Glucose: 439 mg/dL — AB (ref 70–99)
POC Glucose: 409 mg/dL — AB (ref 70–99)

## 2023-09-27 LAB — POCT GLYCOSYLATED HEMOGLOBIN (HGB A1C): HbA1c POC (<> result, manual entry): >15 % (ref 4.0–5.6)

## 2023-09-27 MED ORDER — EMPAGLIFLOZIN 10 MG PO TABS
10.0000 mg | ORAL_TABLET | Freq: Every day | ORAL | 1 refills | Status: DC
Start: 2023-09-27 — End: 2024-04-03
  Filled 2023-09-27 (×2): qty 90, 90d supply, fill #0

## 2023-09-27 MED ORDER — BASAGLAR KWIKPEN 100 UNIT/ML ~~LOC~~ SOPN
40.0000 [IU] | PEN_INJECTOR | Freq: Every day | SUBCUTANEOUS | 6 refills | Status: DC
Start: 2023-09-27 — End: 2024-04-03
  Filled 2023-09-27: qty 30, 75d supply, fill #0
  Filled 2023-09-27: qty 15, 37d supply, fill #0

## 2023-09-27 MED ORDER — METFORMIN HCL 500 MG PO TABS
1000.0000 mg | ORAL_TABLET | Freq: Two times a day (BID) | ORAL | 1 refills | Status: DC
Start: 2023-09-27 — End: 2024-04-03
  Filled 2023-09-27 (×2): qty 360, 90d supply, fill #0

## 2023-09-27 MED ORDER — AMLODIPINE BESYLATE 5 MG PO TABS
5.0000 mg | ORAL_TABLET | Freq: Every day | ORAL | 1 refills | Status: DC
Start: 2023-09-27 — End: 2024-04-03
  Filled 2023-09-27 (×2): qty 90, 90d supply, fill #0
  Filled 2024-03-09: qty 90, 90d supply, fill #1

## 2023-09-27 MED ORDER — INSULIN ASPART 100 UNIT/ML IJ SOLN
20.0000 [IU] | Freq: Once | INTRAMUSCULAR | Status: AC
Start: 2023-09-27 — End: 2023-09-27
  Administered 2023-09-27: 20 [IU] via SUBCUTANEOUS

## 2023-09-27 MED ORDER — ATORVASTATIN CALCIUM 20 MG PO TABS
20.0000 mg | ORAL_TABLET | Freq: Every day | ORAL | 1 refills | Status: DC
Start: 2023-09-27 — End: 2024-04-03
  Filled 2023-09-27 (×2): qty 90, 90d supply, fill #0
  Filled 2024-03-09: qty 90, 90d supply, fill #1

## 2023-09-27 MED ORDER — GABAPENTIN 300 MG PO CAPS
300.0000 mg | ORAL_CAPSULE | Freq: Every day | ORAL | 1 refills | Status: AC
Start: 2023-09-27 — End: ?
  Filled 2023-09-27: qty 90, 90d supply, fill #0
  Filled 2023-09-27: qty 30, 30d supply, fill #0

## 2023-09-27 NOTE — Progress Notes (Signed)
 Subjective:  Patient ID: Benjamin Thompson, male    DOB: [birth year]  Age: 53 y.o. MRN: [id number]  CC: Medical Management of Chronic Issues (Weakness in legs & arms)     Discussed the use of AI scribe software for clinical note transcription with the patient, who gave verbal consent to proceed.  History of Present Illness Benjamin Thompson is a 53 year old male with a history of hypertension, type 2 diabetes mellitus , history of septic arthritis of the sternoclavicular joint, liver abscess in 10/2021 status post incision and drainage, treatment with antibiotics  who presents with weakness in his right arm and leg. Last visit to the clinic was in 2023.  He experiences persistent weakness in his right arm and leg for an unknown duration which he attributes to self-diagnosed stroke but he did not present to the ED.  This is affecting his ability to work. Numbness is present in the right leg and arm. Swelling on the right side of the face occurred as well and he occasionally drools water from one side of his mouth.  He has not taken diabetes medication for several months due to lack of refills and difficulty scheduling appointments. His blood sugar is 439 mg/d and A1c is greater than 15.  He was previously on insulin , Jardiance , and metformin . He experiences unusual sensations in his feet, described as 'tickly and nervous,' with involuntary toe curling. He lacks a functioning glucometer to monitor blood sugar levels.  He has a history of hypertension, previously managed with amlodipine , and hyperlipidemia, for which he was taking atorvastatin . He was also taking gabapentin  for neuropathy.  Blood pressure is elevated today.    Past Medical History:  Diagnosis Date   Diabetes mellitus without complication (HCC)    Hypertension     Past Surgical History:  Procedure Laterality Date   APPENDECTOMY     APPLICATION OF WOUND VAC Left 11/06/2021   Procedure: APPLICATION OF WOUND VAC;  Surgeon:  Shon Downing, MD;  Location: [facility] OR;  Service: Vascular;  Laterality: Left;   IRRIGATION AND DEBRIDEMENT ABSCESS Left 11/06/2021   Procedure: IRRIGATION AND DEBRIDEMENT STERNOCLAVICULAR JOINT ABSCESS;  Surgeon: Shon Downing, MD;  Location: [facility] OR;  Service: Vascular;  Laterality: Left;    Family History  Problem Relation Age of Onset   Healthy Mother    Hypertension Brother    Diabetes Brother    Heart disease Neg Hx     Social History   Socioeconomic History   Marital status: Married    Spouse name: Shelagh Derrick   Number of children: 3   Years of education: Not on file   Highest education level: Not on file  Occupational History   Not on file  Tobacco Use   Smoking status: Never   Smokeless tobacco: Never  Vaping Use   Vaping status: Never Used  Substance and Sexual Activity   Alcohol use: Not Currently    Comment: little   Drug use: No   Sexual activity: Not on file  Other Topics Concern   Not on file  Social History Narrative   Not on file   Social Drivers of Health   Financial Resource Strain: Not on file  Food Insecurity: No Food Insecurity (09/27/2023)   Hunger Vital Sign    Worried About Running Out of Food in the Last Year: Never true    Ran Out of Food in the Last Year: Never true  Transportation Needs: No Transportation Needs (09/27/2023)   PRAPARE - Transportation  Lack of Transportation (Medical): No    Lack of Transportation (Non-Medical): No  Physical Activity: Not on file  Stress: Not on file  Social Connections: Not on file    No Known Allergies  Outpatient Medications Prior to Visit  Medication Sig Dispense Refill   Blood Glucose Monitoring Suppl (ACCU-CHEK GUIDE) w/Device KIT Use As Directed (Patient not taking: Reported on 09/27/2023) 1 kit 0   glucose blood test strip Use test strip to check sugar every morning. (Patient not taking: Reported on 09/27/2023) 100 each 3   Insulin  Pen Needle 32G X 4 MM MISC Use As Directed (Patient not taking:  Reported on 09/27/2023) 100 each 0   Microlet Lancets MISC Use to check sugar every morning (Patient not taking: Reported on 09/27/2023) 100 each 3   mupirocin  cream (BACTROBAN ) 2 % Apply 1 Application topically 2 (two) times daily. (Patient not taking: Reported on 09/27/2023) 15 g 0   tiZANidine  (ZANAFLEX ) 4 MG tablet Take 1 tablet (4 mg total) by mouth every 8 (eight) hours as needed for muscle spasms. Do not take with alcohol or while driving or operating heavy machinery.  May cause drowsiness. (Patient not taking: Reported on 09/27/2023) 30 tablet 0   triamcinolone  ointment (KENALOG ) 0.1 % Apply 1 Application topically 2 (two) times daily. Use sparingly twice daily as needed for itching; do not use for more than 2 weeks in a row. (Patient not taking: Reported on 09/27/2023) 30 g 0   acetaminophen  (TYLENOL ) 500 MG tablet Take 1 tablet (500 mg total) by mouth every 6 (six) hours as needed for moderate pain. (Patient not taking: Reported on 09/27/2023) 30 tablet 0   amLODipine  (NORVASC ) 5 MG tablet Take 1 tablet (5 mg total) by mouth daily. Please schedule appointment with Dr. Ezmae Speers. (Patient not taking: Reported on 09/27/2023) 30 tablet 0   atorvastatin  (LIPITOR) 20 MG tablet Take 1 tablet (20 mg total) by mouth daily. (Patient not taking: Reported on 09/27/2023) 30 tablet 3   cephALEXin  (KEFLEX ) 500 MG capsule Take 1 capsule (500 mg total) by mouth 4 (four) times daily. (Patient not taking: Reported on 09/27/2023) 20 capsule 0   empagliflozin  (JARDIANCE ) 10 MG TABS tablet Take 1 tablet (10 mg total) by mouth daily. (Patient not taking: Reported on 09/27/2023) 90 tablet 1   gabapentin  (NEURONTIN ) 300 MG capsule Take 1 capsule (300 mg total) by mouth at bedtime. (Patient not taking: Reported on 09/27/2023) 30 capsule 3   Insulin  Glargine (BASAGLAR  KWIKPEN) 100 UNIT/ML Inject 40 Units into the skin daily. 12 mL 6   metFORMIN  (GLUCOPHAGE ) 500 MG tablet Take 2 tablets (1,000 mg total) by mouth 2 (two) times daily  with a meal.Must have office visit for refills (Patient not taking: Reported on 09/27/2023) 120 tablet 0   sildenafil  (VIAGRA ) 50 MG tablet Take 1 tablet (50 mg total) by mouth daily as needed for erectile dysfunction. At Least 24 hours between doses (Patient not taking: Reported on 09/27/2023) 10 tablet 0   No facility-administered medications prior to visit.     ROS Review of Systems  Constitutional:  Negative for activity change and appetite change.  HENT:  Negative for sinus pressure and sore throat.   Respiratory:  Negative for chest tightness, shortness of breath and wheezing.   Cardiovascular:  Negative for chest pain and palpitations.  Gastrointestinal:  Negative for abdominal distention, abdominal pain and constipation.  Genitourinary: Negative.   Musculoskeletal:        See HPI  Psychiatric/Behavioral:  Negative  for behavioral problems and dysphoric mood.     Objective:  BP (!) 152/91   Pulse 85   Ht 5\' 3"  (1.6 m)   Wt 152 lb (68.9 kg)   SpO2 98%   BMI 26.93 kg/m      09/27/2023    4:01 PM 09/27/2023    2:58 PM 11/26/2022    1:24 PM  BP/Weight  Systolic BP 152 155 174  Diastolic BP 91 95 100  Wt. (Lbs)  152   BMI  26.93 kg/m2       Physical Exam Constitutional:      Appearance: He is well-developed.  Cardiovascular:     Rate and Rhythm: Normal rate.     Heart sounds: Normal heart sounds. No murmur heard. Pulmonary:     Effort: Pulmonary effort is normal.     Breath sounds: Normal breath sounds. No wheezing or rales.  Chest:     Chest wall: No tenderness.  Abdominal:     General: Bowel sounds are normal. There is no distension.     Palpations: Abdomen is soft. There is no mass.     Tenderness: There is no abdominal tenderness.  Musculoskeletal:        General: Normal range of motion.     Right lower leg: No edema.     Left lower leg: No edema.  Neurological:     Mental Status: He is alert and oriented to person, place, and time.  Psychiatric:         Mood and Affect: Mood normal.        Latest Ref Rng & Units 11/26/2022    1:50 PM 11/18/2022    4:45 PM 12/15/2021    3:12 PM  CMP  Glucose 70 - 99 mg/dL 657  846  962   BUN 6 - 20 mg/dL 10  14  11    Creatinine 0.61 - 1.24 mg/dL 9.52  8.41  3.24   Sodium 135 - 145 mmol/L 135  135  140   Potassium 3.5 - 5.1 mmol/L 4.6  4.2  3.9   Chloride 98 - 111 mmol/L 98  99  101   CO2 22 - 32 mmol/L 28  23  27    Calcium  8.9 - 10.3 mg/dL 9.2  8.6  9.6   Total Protein 6.5 - 8.1 g/dL 7.8     Total Bilirubin 0.3 - 1.2 mg/dL 0.6     Alkaline Phos 38 - 126 U/L 73     AST 15 - 41 U/L 20     ALT 0 - 44 U/L 25       Lipid Panel     Component Value Date/Time   CHOL 160 11/25/2021 1435   TRIG 327 (H) 11/25/2021 1435   HDL 32 (L) 11/25/2021 1435   CHOLHDL 5.0 11/25/2021 1435   CHOLHDL 5.0 03/09/2008 0000   VLDL 48 03/09/2008 0000   LDLCALC 75 11/25/2021 1435    CBC    Component Value Date/Time   WBC 5.7 11/26/2022 1350   RBC 5.61 11/26/2022 1350   HGB 16.3 11/26/2022 1350   HCT 49.6 11/26/2022 1350   PLT 236 11/26/2022 1350   MCV 88.4 11/26/2022 1350   MCH 29.1 11/26/2022 1350   MCHC 32.9 11/26/2022 1350   RDW 13.2 11/26/2022 1350   LYMPHSABS 1.6 11/26/2022 1350   MONOABS 0.5 11/26/2022 1350   EOSABS 0.2 11/26/2022 1350   BASOSABS 0.1 11/26/2022 1350    Lab Results  Component Value Date  HGBA1C >15.0 09/27/2023       Assessment & Plan Diabetes mellitus with complication Blood sugar significantly elevated at 439 mg/dL.,  A1c greater than 15.   Lapsed diabetes medication due to refill and appointment issues.  - Administered Novolog  insulin  20 units in clinic, oral hydration provided, recheck blood glucose in 30 minutes was 409 - Refilled insulin  glargine, empagliflozin , and metformin  1000 mg (two 500 mg tablets twice daily). - Prescribed new blood glucose meter. Instructed to check blood sugar in the morning before eating and at bedtime. - Scheduled follow-up in one month to  reassess blood sugar control. -Counseled on Diabetic diet, my plate method, 086 minutes of moderate intensity exercise/week Blood sugar logs with fasting goals of 80-120 mg/dl, random of less than 578 and in the event of sugars less than 60 mg/dl or greater than 469 mg/dl encouraged to notify the clinic. Advised on the need for annual eye exams, annual foot exams, Pneumonia vaccine.   Neuropathy Abnormal sensations in feet, possibly related to diabetes or post-stroke. - Refilled gabapentin .  Hypertension Blood pressure elevated due to lapsed antihypertensive medication. - Refilled amlodipine . -Counseled on blood pressure goal of less than 130/80, low-sodium, DASH diet, medication compliance, 150 minutes of moderate intensity exercise per week. Discussed medication compliance, adverse effects.   Right-sided weakness post-stroke Persistent right-sided weakness and numbness post-unreported possible stroke. No prior evaluation or imaging. - Ordered CT scan of the head. - Referred to physical therapy for strength improvement in right arm and leg.  General Health Maintenance Routine diabetes care requires annual foot and eye exams. - Performed foot exam. - Advised to schedule an eye exam.        Meds ordered this encounter  Medications   amLODipine  (NORVASC ) 5 MG tablet    Sig: Take 1 tablet (5 mg total) by mouth daily.    Dispense:  90 tablet    Refill:  1   atorvastatin  (LIPITOR) 20 MG tablet    Sig: Take 1 tablet (20 mg total) by mouth daily.    Dispense:  90 tablet    Refill:  1   empagliflozin  (JARDIANCE ) 10 MG TABS tablet    Sig: Take 1 tablet (10 mg total) by mouth daily.    Dispense:  90 tablet    Refill:  1   gabapentin  (NEURONTIN ) 300 MG capsule    Sig: Take 1 capsule (300 mg total) by mouth at bedtime.    Dispense:  90 capsule    Refill:  1   Insulin  Glargine (BASAGLAR  KWIKPEN) 100 UNIT/ML    Sig: Inject 40 Units into the skin daily.    Dispense:  30 mL     Refill:  6   metFORMIN  (GLUCOPHAGE ) 500 MG tablet    Sig: Take 2 tablets (1,000 mg total) by mouth 2 (two) times daily with a meal.    Dispense:  360 tablet    Refill:  1   insulin  aspart (novoLOG ) injection 20 Units    Follow-up: Return in about 1 month (around 10/28/2023) for Diabetes follow-up with PCP.       Joaquin Mulberry, MD, FAAFP. [facility] [place], [place] [phone]   09/27/2023, 4:06 PM

## 2023-09-27 NOTE — Patient Instructions (Signed)
 Diabetes mellitus y nutricin, en adultos Diabetes Mellitus and Nutrition, Adult Si sufre de diabetes, o diabetes mellitus, es muy importante tener hbitos alimenticios saludables debido a que sus niveles de Psychologist, counselling sangre (glucosa) se ven afectados en gran medida por lo que come y bebe. [place] alimentos saludables en las cantidades correctas, aproximadamente a la misma hora todos los [place], [place] ayudar a: Chief Operating Officer su glucemia. Disminuir el riesgo de sufrir una enfermedad cardaca. Mejorar la presin arterial. Barista o mantener un peso saludable. Qu puede afectar mi plan de alimentacin? Todas las personas que sufren de diabetes son diferentes y cada una tiene necesidades diferentes en cuanto a un plan de alimentacin. El mdico puede recomendarle que trabaje con un nutricionista para elaborar el mejor plan para usted. Su plan de alimentacin puede variar segn factores como: Las caloras que necesita. Los medicamentos que toma. Su peso. Sus niveles de glucemia, presin arterial y colesterol. Su nivel de [place]. Otras afecciones que tenga, como enfermedades cardacas o renales. Cmo me afectan los carbohidratos? Los carbohidratos, o hidratos de carbono, afectan su nivel de glucemia ms que cualquier otro tipo de alimento. La ingesta de carbohidratos aumenta la cantidad de [employer]. Es importante conocer la cantidad de carbohidratos que se pueden ingerir en cada comida sin correr Surveyor, minerals. Esto es Government social research officer. Su nutricionista puede ayudarlo a calcular la cantidad de carbohidratos que debe ingerir en cada comida y en cada refrigerio. Cmo me afecta el alcohol? El alcohol puede provocar una disminucin de la glucemia (hipoglucemia), especialmente si [place] insulina o toma determinados medicamentos por va oral para la diabetes. La hipoglucemia es una afeccin potencialmente mortal. Los sntomas de la hipoglucemia, como somnolencia, mareos y confusin, son  similares a los sntomas de haber consumido demasiado alcohol. No beba alcohol si: Su mdico le indica no hacerlo. Est embarazada, puede estar embarazada o est tratando de [place]. Si bebe alcohol: Limite la cantidad que bebe a lo siguiente: De 0 a 1 medida por da para las mujeres. De 0 a 2 medidas por da para los hombres. Sepa cunta cantidad de alcohol hay en las bebidas que toma. En los [address], una medida equivale a una botella de cerveza de 12 oz (355 ml), un vaso de vino de 5 oz (148 ml) o un vaso de una bebida alcohlica de alta graduacin de 1 oz (44 ml). Mantngase hidratado bebiendo agua, refrescos dietticos o t helado sin azcar. Tenga en cuenta que los refrescos comunes, los jugos y otras bebidas para mezclar pueden contener Product/process development scientist y se deben contar como carbohidratos. Consejos para seguir Social worker las etiquetas de los alimentos Comience por leer el tamao de la porcin en la etiqueta de Informacin nutricional de los alimentos envasados y las bebidas. La cantidad de caloras, carbohidratos, grasas y otros nutrientes detallados en la etiqueta se basan en una porcin del alimento. Muchos alimentos contienen ms de una porcin por envase. Verifique la cantidad total de gramos (g) de carbohidratos totales en una porcin. Verifique la cantidad de gramos de grasas saturadas y grasas trans en una porcin. Escoja alimentos que no contengan estas grasas o que su contenido de estas sea [place]. Verifique la cantidad de miligramos (mg) de sal (sodio) en una porcin. La [employer] de las personas deben limitar la ingesta de sodio total a menos de 2300 mg Google. Siempre consulte la informacin nutricional de los alimentos etiquetados como "con bajo contenido de grasa" o "sin grasa".  Estos alimentos pueden tener un mayor contenido de International aid/development worker agregada o carbohidratos refinados, y deben evitarse. Hable con su nutricionista para identificar sus objetivos diarios en  cuanto a los nutrientes mencionados en la etiqueta. Al ir de compras Evite comprar alimentos procesados, enlatados o precocidos. Estos alimentos tienden a Counselling psychologist mayor cantidad de [place], sodio y azcar agregada. Compre en la zona exterior de la tienda de comestibles. Esta es la zona donde se encuentran con mayor frecuencia las frutas y las verduras frescas, los cereales a granel, las carnes frescas y los productos lcteos frescos. Al cocinar Use mtodos de coccin a baja temperatura, como hornear, en lugar de mtodos de coccin a alta temperatura, como frer en abundante aceite. Cocine con aceites saludables, como el aceite de [place], canola o [place]. Evite cocinar con manteca, crema o carnes con alto contenido de grasa. Planificacin de las comidas Coma las comidas y los refrigerios regularmente, preferentemente a la misma hora todos [place]. Evite pasar largos perodos de tiempo sin comer. Consuma alimentos ricos en fibra, como frutas frescas, verduras, frijoles y cereales integrales. Consuma entre 4 y 6 onzas (entre 112 y 168 g) de protenas magras por da, como carnes [place], pollo, pescado, huevos o tofu. Una onza (oz) (28 g) de protena magra equivale a: 1 onza (28 g) de carne, pollo o pescado. 1 huevo.  taza (62 g) de tofu. Coma algunos alimentos por da que contengan grasas saludables, como aguacates, frutos secos, semillas y pescado. Qu alimentos debo comer? Nils Pyle Bayas. Manzanas. Naranjas. Duraznos. Damascos. Ciruelas. Uvas. Mangos. Papayas. Granadas. Kiwi. Cerezas. Verduras Verduras de [employer], que incluyen [place], [place], col rizada, acelga, hojas de berza, hojas de mostaza y repollo. Remolachas. Coliflor. Brcoli. Zanahorias. Judas verdes. Tomates. Pimientos. Cebollas. Pepinos. Coles de Bruselas. Granos Granos integrales, como panes, galletas, tortillas, cereales y pastas de salvado o integrales. Avena sin azcar. Quinua. Arroz integral o salvaje. Carnes y otras  protenas Frutos de mar. Carne de ave sin piel. Cortes magros de ave y carne de res. Tofu. Frutos secos. Semillas. Lcteos Productos lcteos sin grasa o con bajo contenido de [place], [place], yogur y [place]. Es posible que los productos detallados arriba no constituyan una lista completa de los alimentos y las bebidas que puede tomar. Consulte a un nutricionista para obtener ms informacin. Qu alimentos debo evitar? Nils Pyle Frutas enlatadas al almbar. Verduras Verduras enlatadas. Verduras congeladas con mantequilla o salsa de crema. Granos Productos elaborados con [place] y [place], como panes, pastas, bocadillos y cereales. Evite todos los alimentos procesados. Carnes y [address] de carne con alto contenido de Holiday representative. Carne de ave con piel. Carnes empanizadas o fritas. Carne procesada. Evite las grasas saturadas. Lcteos Yogur, queso o [employer] Health. Bebidas Bebidas azucaradas, como gaseosas o t helado. Es posible que los productos que se enumeran ms [place] no constituyan una lista completa de los alimentos y las bebidas que Personnel officer. Consulte a un nutricionista para obtener ms informacin. Preguntas para hacerle al mdico Debo consultar con un especialista certificado en atencin y educacin sobre la diabetes? Es necesario que me rena con un nutricionista? A qu nmero puedo llamar si tengo preguntas? Cules son los mejores momentos para controlar la glucemia? Dnde encontrar ms informacin: American Diabetes Association (Asociacin Estadounidense de la Diabetes): diabetes.org Academy of Nutrition and Dietetics (Academia de Nutricin y Pension scheme manager): eatright.Dana Corporation of Diabetes and Digestive and Kidney Diseases [employer] de la Diabetes y las Enfermedades Digestivas y Renales): StageSync.si Association of Diabetes  Care & Education Specialists (Asociacin de Especialistas en Atencin y Francella Solian la Diabetes):  diabeteseducator.org Resumen Es importante tener hbitos alimenticios saludables debido a que sus niveles de Psychologist, counselling sangre (glucosa) se ven afectados en gran medida por lo que come y bebe. Es importante consumir alcohol con prudencia. Un plan de comidas saludable lo ayudar a controlar la glucosa en sangre y a reducir el riesgo de enfermedades cardacas. El mdico puede recomendarle que trabaje con un nutricionista para elaborar el mejor plan para usted. Esta informacin no tiene Theme park manager el consejo del mdico. Asegrese de hacerle al mdico cualquier pregunta que tenga. Document Revised: 01/09/2020 Document Reviewed: 01/09/2020 Elsevier Patient Education  2024 [employer].

## 2023-09-28 ENCOUNTER — Other Ambulatory Visit: Payer: Self-pay | Admitting: Pharmacist

## 2023-09-28 ENCOUNTER — Other Ambulatory Visit: Payer: Self-pay

## 2023-09-28 ENCOUNTER — Ambulatory Visit: Payer: Self-pay | Admitting: Family Medicine

## 2023-09-28 LAB — CMP14+EGFR
ALT: 28 IU/L (ref 0–44)
AST: 15 IU/L (ref 0–40)
Albumin: 4.3 g/dL (ref 3.8–4.9)
Alkaline Phosphatase: 98 IU/L (ref 44–121)
BUN/Creatinine Ratio: 17 (ref 9–20)
BUN: 15 mg/dL (ref 6–24)
Bilirubin Total: 0.3 mg/dL (ref 0.0–1.2)
CO2: 22 mmol/L (ref 20–29)
Calcium: 9.5 mg/dL (ref 8.7–10.2)
Chloride: 97 mmol/L (ref 96–106)
Creatinine, Ser: 0.9 mg/dL (ref 0.76–1.27)
Globulin, Total: 3 g/dL (ref 1.5–4.5)
Glucose: 391 mg/dL — ABNORMAL HIGH (ref 70–99)
Potassium: 4.6 mmol/L (ref 3.5–5.2)
Sodium: 134 mmol/L (ref 134–144)
Total Protein: 7.3 g/dL (ref 6.0–8.5)
eGFR: 103 mL/min/{1.73_m2} (ref >59)

## 2023-09-28 MED ORDER — ACCU-CHEK GUIDE W/DEVICE KIT
PACK | 0 refills | Status: AC
  Filled 2023-09-28: qty 1, 30d supply, fill #0

## 2023-09-28 MED ORDER — ACCU-CHEK GUIDE TEST VI STRP
ORAL_STRIP | 6 refills | Status: AC
  Filled 2023-09-28: qty 100, 30d supply, fill #0

## 2023-09-28 MED ORDER — ACCU-CHEK SOFTCLIX LANCETS MISC
6 refills | Status: AC
  Filled 2023-09-28: qty 100, 30d supply, fill #0

## 2023-09-30 ENCOUNTER — Other Ambulatory Visit: Payer: Self-pay

## 2023-10-07 ENCOUNTER — Other Ambulatory Visit: Payer: Self-pay

## 2023-11-16 ENCOUNTER — Encounter: Payer: Self-pay | Admitting: Family Medicine

## 2023-11-16 ENCOUNTER — Ambulatory Visit: Attending: Family Medicine | Admitting: Family Medicine

## 2023-11-16 VITALS — BP 119/74 | HR 73 | Ht 63.0 in | Wt 158.8 lb

## 2023-11-16 DIAGNOSIS — Z23 Encounter for immunization: Secondary | ICD-10-CM

## 2023-11-16 DIAGNOSIS — Z794 Long term (current) use of insulin: Secondary | ICD-10-CM | POA: Diagnosis not present

## 2023-11-16 DIAGNOSIS — Z7984 Long term (current) use of oral hypoglycemic drugs: Secondary | ICD-10-CM | POA: Diagnosis not present

## 2023-11-16 DIAGNOSIS — G8191 Hemiplegia, unspecified affecting right dominant side: Secondary | ICD-10-CM | POA: Diagnosis not present

## 2023-11-16 DIAGNOSIS — Z1211 Encounter for screening for malignant neoplasm of colon: Secondary | ICD-10-CM

## 2023-11-16 DIAGNOSIS — E1165 Type 2 diabetes mellitus with hyperglycemia: Secondary | ICD-10-CM | POA: Diagnosis not present

## 2023-11-16 LAB — GLUCOSE, POCT (MANUAL RESULT ENTRY): POC Glucose: 245 mg/dL — AB (ref 70–99)

## 2023-11-16 NOTE — Addendum Note (Signed)
 Addended by: LONITA FLORETTE GAILS on: 11/16/2023 02:34 PM   Modules accepted: Orders

## 2023-11-16 NOTE — Patient Instructions (Signed)
 Diabetes mellitus y nutricin, en adultos Diabetes Mellitus and Nutrition, Adult Si sufre de diabetes, o diabetes mellitus, es muy importante tener hbitos alimenticios saludables debido a que sus niveles de Psychologist, counselling sangre (glucosa) se ven afectados en gran medida por lo que come y bebe. [place] alimentos saludables en las cantidades correctas, aproximadamente a la misma hora todos los [place], [place] ayudar a: Chief Operating Officer su glucemia. Disminuir el riesgo de sufrir una enfermedad cardaca. Mejorar la presin arterial. Barista o mantener un peso saludable. Qu puede afectar mi plan de alimentacin? Todas las personas que sufren de diabetes son diferentes y cada una tiene necesidades diferentes en cuanto a un plan de alimentacin. El mdico puede recomendarle que trabaje con un nutricionista para elaborar el mejor plan para usted. Su plan de alimentacin puede variar segn factores como: Las caloras que necesita. Los medicamentos que toma. Su peso. Sus niveles de glucemia, presin arterial y colesterol. Su nivel de [place]. Otras afecciones que tenga, como enfermedades cardacas o renales. Cmo me afectan los carbohidratos? Los carbohidratos, o hidratos de carbono, afectan su nivel de glucemia ms que cualquier otro tipo de alimento. La ingesta de carbohidratos aumenta la cantidad de [employer]. Es importante conocer la cantidad de carbohidratos que se pueden ingerir en cada comida sin correr Surveyor, minerals. Esto es Government social research officer. Su nutricionista puede ayudarlo a calcular la cantidad de carbohidratos que debe ingerir en cada comida y en cada refrigerio. Cmo me afecta el alcohol? El alcohol puede provocar una disminucin de la glucemia (hipoglucemia), especialmente si [place] insulina o toma determinados medicamentos por va oral para la diabetes. La hipoglucemia es una afeccin potencialmente mortal. Los sntomas de la hipoglucemia, como somnolencia, mareos y confusin, son  similares a los sntomas de haber consumido demasiado alcohol. No beba alcohol si: Su mdico le indica no hacerlo. Est embarazada, puede estar embarazada o est tratando de [place]. Si bebe alcohol: Limite la cantidad que bebe a lo siguiente: De 0 a 1 medida por da para las mujeres. De 0 a 2 medidas por da para los hombres. Sepa cunta cantidad de alcohol hay en las bebidas que toma. En los [address], una medida equivale a una botella de cerveza de 12 oz (355 ml), un vaso de vino de 5 oz (148 ml) o un vaso de una bebida alcohlica de alta graduacin de 1 oz (44 ml). Mantngase hidratado bebiendo agua, refrescos dietticos o t helado sin azcar. Tenga en cuenta que los refrescos comunes, los jugos y otras bebidas para mezclar pueden contener Product/process development scientist y se deben contar como carbohidratos. Consejos para seguir Social worker las etiquetas de los alimentos Comience por leer el tamao de la porcin en la etiqueta de Informacin nutricional de los alimentos envasados y las bebidas. La cantidad de caloras, carbohidratos, grasas y otros nutrientes detallados en la etiqueta se basan en una porcin del alimento. Muchos alimentos contienen ms de una porcin por envase. Verifique la cantidad total de gramos (g) de carbohidratos totales en una porcin. Verifique la cantidad de gramos de grasas saturadas y grasas trans en una porcin. Escoja alimentos que no contengan estas grasas o que su contenido de estas sea [place]. Verifique la cantidad de miligramos (mg) de sal (sodio) en una porcin. La [employer] de las personas deben limitar la ingesta de sodio total a menos de 2300 mg Google. Siempre consulte la informacin nutricional de los alimentos etiquetados como "con bajo contenido de grasa" o "sin grasa".  Estos alimentos pueden tener un mayor contenido de International aid/development worker agregada o carbohidratos refinados, y deben evitarse. Hable con su nutricionista para identificar sus objetivos diarios en  cuanto a los nutrientes mencionados en la etiqueta. Al ir de compras Evite comprar alimentos procesados, enlatados o precocidos. Estos alimentos tienden a Counselling psychologist mayor cantidad de [place], sodio y azcar agregada. Compre en la zona exterior de la tienda de comestibles. Esta es la zona donde se encuentran con mayor frecuencia las frutas y las verduras frescas, los cereales a granel, las carnes frescas y los productos lcteos frescos. Al cocinar Use mtodos de coccin a baja temperatura, como hornear, en lugar de mtodos de coccin a alta temperatura, como frer en abundante aceite. Cocine con aceites saludables, como el aceite de [place], canola o [place]. Evite cocinar con manteca, crema o carnes con alto contenido de grasa. Planificacin de las comidas Coma las comidas y los refrigerios regularmente, preferentemente a la misma hora todos [place]. Evite pasar largos perodos de tiempo sin comer. Consuma alimentos ricos en fibra, como frutas frescas, verduras, frijoles y cereales integrales. Consuma entre 4 y 6 onzas (entre 112 y 168 g) de protenas magras por da, como carnes [place], pollo, pescado, huevos o tofu. Una onza (oz) (28 g) de protena magra equivale a: 1 onza (28 g) de carne, pollo o pescado. 1 huevo.  taza (62 g) de tofu. Coma algunos alimentos por da que contengan grasas saludables, como aguacates, frutos secos, semillas y pescado. Qu alimentos debo comer? Nils Pyle Bayas. Manzanas. Naranjas. Duraznos. Damascos. Ciruelas. Uvas. Mangos. Papayas. Granadas. Kiwi. Cerezas. Verduras Verduras de [employer], que incluyen [place], [place], col rizada, acelga, hojas de berza, hojas de mostaza y repollo. Remolachas. Coliflor. Brcoli. Zanahorias. Judas verdes. Tomates. Pimientos. Cebollas. Pepinos. Coles de Bruselas. Granos Granos integrales, como panes, galletas, tortillas, cereales y pastas de salvado o integrales. Avena sin azcar. Quinua. Arroz integral o salvaje. Carnes y otras  protenas Frutos de mar. Carne de ave sin piel. Cortes magros de ave y carne de res. Tofu. Frutos secos. Semillas. Lcteos Productos lcteos sin grasa o con bajo contenido de [place], [place], yogur y [place]. Es posible que los productos detallados arriba no constituyan una lista completa de los alimentos y las bebidas que puede tomar. Consulte a un nutricionista para obtener ms informacin. Qu alimentos debo evitar? Nils Pyle Frutas enlatadas al almbar. Verduras Verduras enlatadas. Verduras congeladas con mantequilla o salsa de crema. Granos Productos elaborados con [place] y [place], como panes, pastas, bocadillos y cereales. Evite todos los alimentos procesados. Carnes y [address] de carne con alto contenido de Holiday representative. Carne de ave con piel. Carnes empanizadas o fritas. Carne procesada. Evite las grasas saturadas. Lcteos Yogur, queso o [employer] Health. Bebidas Bebidas azucaradas, como gaseosas o t helado. Es posible que los productos que se enumeran ms [place] no constituyan una lista completa de los alimentos y las bebidas que Personnel officer. Consulte a un nutricionista para obtener ms informacin. Preguntas para hacerle al mdico Debo consultar con un especialista certificado en atencin y educacin sobre la diabetes? Es necesario que me rena con un nutricionista? A qu nmero puedo llamar si tengo preguntas? Cules son los mejores momentos para controlar la glucemia? Dnde encontrar ms informacin: American Diabetes Association (Asociacin Estadounidense de la Diabetes): diabetes.org Academy of Nutrition and Dietetics (Academia de Nutricin y Pension scheme manager): eatright.Dana Corporation of Diabetes and Digestive and Kidney Diseases [employer] de la Diabetes y las Enfermedades Digestivas y Renales): StageSync.si Association of Diabetes  Care & Education Specialists (Asociacin de Especialistas en Atencin y Francella Solian la Diabetes):  diabeteseducator.org Resumen Es importante tener hbitos alimenticios saludables debido a que sus niveles de Psychologist, counselling sangre (glucosa) se ven afectados en gran medida por lo que come y bebe. Es importante consumir alcohol con prudencia. Un plan de comidas saludable lo ayudar a controlar la glucosa en sangre y a reducir el riesgo de enfermedades cardacas. El mdico puede recomendarle que trabaje con un nutricionista para elaborar el mejor plan para usted. Esta informacin no tiene Theme park manager el consejo del mdico. Asegrese de hacerle al mdico cualquier pregunta que tenga. Document Revised: 01/09/2020 Document Reviewed: 01/09/2020 Elsevier Patient Education  2024 [employer].

## 2023-11-16 NOTE — Progress Notes (Signed)
 Subjective:  Patient ID: Benjamin Thompson, male    DOB: July 11, 1970  Age: 53 y.o. MRN: [id number]  CC: Diabetes     Discussed the use of AI scribe software for clinical note transcription with the patient, who gave verbal consent to proceed.  History of Present Illness Benjamin Thompson is a 53 year old male with with a history of hypertension, type 2 diabetes mellitus , history of septic arthritis of the sternoclavicular joint, liver abscess in 10/2021 status post incision and drainage, treatment with antibiotics, right-sided weakness (with suspicion for possible stroke as he never sought medical attention) who presents for follow-up of blood sugar management.  A1c at last visit was greater than 15 and he was last seen in the clinic 2 years ago. Blood sugar levels at home range from 160 to 190 mg/dL during the day. He does not check fasting blood sugar levels in the morning before breakfast. Current medication regimen includes 40 units of Lantus  insulin , metformin , and Jardiance .    Past Medical History:  Diagnosis Date   Diabetes mellitus without complication (HCC)    Hypertension     Past Surgical History:  Procedure Laterality Date   APPENDECTOMY     APPLICATION OF WOUND VAC Left 11/06/2021   Procedure: APPLICATION OF WOUND VAC;  Surgeon: Obadiah Coy, MD;  Location: [facility] OR;  Service: Vascular;  Laterality: Left;   IRRIGATION AND DEBRIDEMENT ABSCESS Left 11/06/2021   Procedure: IRRIGATION AND DEBRIDEMENT STERNOCLAVICULAR JOINT ABSCESS;  Surgeon: Obadiah Coy, MD;  Location: [facility] OR;  Service: Vascular;  Laterality: Left;    Family History  Problem Relation Age of Onset   Healthy Mother    Hypertension Brother    Diabetes Brother    Heart disease Neg Hx     Social History   Socioeconomic History   Marital status: Married    Spouse name: Hadassah   Number of children: 3   Years of education: Not on file   Highest education level: Not on file  Occupational History   Not on  file  Tobacco Use   Smoking status: Never   Smokeless tobacco: Never  Vaping Use   Vaping status: Never Used  Substance and Sexual Activity   Alcohol use: Not Currently    Comment: little   Drug use: No   Sexual activity: Not on file  Other Topics Concern   Not on file  Social History Narrative   Not on file   Social Drivers of Health   Financial Resource Strain: Not on file  Food Insecurity: No Food Insecurity (09/27/2023)   Hunger Vital Sign    Worried About Running Out of Food in the Last Year: Never true    Ran Out of Food in the Last Year: Never true  Transportation Needs: No Transportation Needs (09/27/2023)   PRAPARE - Administrator, Civil Service (Medical): No    Lack of Transportation (Non-Medical): No  Physical Activity: Not on file  Stress: Not on file  Social Connections: Not on file    No Known Allergies  Outpatient Medications Prior to Visit  Medication Sig Dispense Refill   Accu-Chek Softclix Lancets lancets Use to check blood sugar 3 times daily. 100 each 6   amLODipine  (NORVASC ) 5 MG tablet Take 1 tablet (5 mg total) by mouth daily. 90 tablet 1   atorvastatin  (LIPITOR) 20 MG tablet Take 1 tablet (20 mg total) by mouth daily. 90 tablet 1   Blood Glucose Monitoring Suppl (ACCU-CHEK GUIDE) w/Device  KIT Use to check blood sugar 3 times daily. 1 kit 0   empagliflozin  (JARDIANCE ) 10 MG TABS tablet Take 1 tablet (10 mg total) by mouth daily. 90 tablet 1   gabapentin  (NEURONTIN ) 300 MG capsule Take 1 capsule (300 mg total) by mouth at bedtime. 90 capsule 1   glucose blood (ACCU-CHEK GUIDE TEST) test strip Use to check blood sugar 3 times daily. 100 each 6   Insulin  Glargine (BASAGLAR  KWIKPEN) 100 UNIT/ML Inject 40 Units into the skin daily. 30 mL 6   Insulin  Pen Needle 32G X 4 MM MISC Use As Directed 100 each 0   metFORMIN  (GLUCOPHAGE ) 500 MG tablet Take 2 tablets (1,000 mg total) by mouth 2 (two) times daily with a meal. 360 tablet 1   mupirocin  cream  (BACTROBAN ) 2 % Apply 1 Application topically 2 (two) times daily. (Patient not taking: Reported on 11/16/2023) 15 g 0   tiZANidine  (ZANAFLEX ) 4 MG tablet Take 1 tablet (4 mg total) by mouth every 8 (eight) hours as needed for muscle spasms. Do not take with alcohol or while driving or operating heavy machinery.  May cause drowsiness. (Patient not taking: Reported on 11/16/2023) 30 tablet 0   triamcinolone  ointment (KENALOG ) 0.1 % Apply 1 Application topically 2 (two) times daily. Use sparingly twice daily as needed for itching; do not use for more than 2 weeks in a row. (Patient not taking: Reported on 11/16/2023) 30 g 0   No facility-administered medications prior to visit.     ROS Review of Systems  Constitutional:  Negative for activity change and appetite change.  HENT:  Negative for sinus pressure and sore throat.   Respiratory:  Negative for chest tightness, shortness of breath and wheezing.   Cardiovascular:  Negative for chest pain and palpitations.  Gastrointestinal:  Negative for abdominal distention, abdominal pain and constipation.  Genitourinary: Negative.   Musculoskeletal: Negative.   Neurological:  Positive for weakness.  Psychiatric/Behavioral:  Negative for behavioral problems and dysphoric mood.     Objective:  BP 119/74   Pulse 73   Ht 5' 3 (1.6 m)   Wt 158 lb 12.8 oz (72 kg)   SpO2 97%   BMI 28.13 kg/m      11/16/2023    1:39 PM 09/27/2023    4:01 PM 09/27/2023    2:58 PM  BP/Weight  Systolic BP 119 152 155  Diastolic BP 74 91 95  Wt. (Lbs) 158.8  152  BMI 28.13 kg/m2  26.93 kg/m2      Physical Exam Constitutional:      Appearance: He is well-developed.  Cardiovascular:     Rate and Rhythm: Normal rate.     Heart sounds: Normal heart sounds. No murmur heard. Pulmonary:     Effort: Pulmonary effort is normal.     Breath sounds: Normal breath sounds. No wheezing or rales.  Chest:     Chest wall: No tenderness.  Abdominal:     General: Bowel sounds  are normal. There is no distension.     Palpations: Abdomen is soft. There is no mass.     Tenderness: There is no abdominal tenderness.  Musculoskeletal:        General: Normal range of motion.     Right lower leg: No edema.     Left lower leg: No edema.  Neurological:     Mental Status: He is alert and oriented to person, place, and time.     Comments: Right sided weakness  Psychiatric:  Mood and Affect: Mood normal.        Latest Ref Rng & Units 09/27/2023    4:20 PM 11/26/2022    1:50 PM 11/18/2022    4:45 PM  CMP  Glucose 70 - 99 mg/dL 608  737  628   BUN 6 - 24 mg/dL 15  10  14    Creatinine 0.76 - 1.27 mg/dL 9.09  9.28  8.80   Sodium 134 - 144 mmol/L 134  135  135   Potassium 3.5 - 5.2 mmol/L 4.6  4.6  4.2   Chloride 96 - 106 mmol/L 97  98  99   CO2 20 - 29 mmol/L 22  28  23    Calcium  8.7 - 10.2 mg/dL 9.5  9.2  8.6   Total Protein 6.0 - 8.5 g/dL 7.3  7.8    Total Bilirubin 0.0 - 1.2 mg/dL 0.3  0.6    Alkaline Phos 44 - 121 IU/L 98  73    AST 0 - 40 IU/L 15  20    ALT 0 - 44 IU/L 28  25      Lipid Panel     Component Value Date/Time   CHOL 160 11/25/2021 1435   TRIG 327 (H) 11/25/2021 1435   HDL 32 (L) 11/25/2021 1435   CHOLHDL 5.0 11/25/2021 1435   CHOLHDL 5.0 03/09/2008 0000   VLDL 48 03/09/2008 0000   LDLCALC 75 11/25/2021 1435    CBC    Component Value Date/Time   WBC 5.7 11/26/2022 1350   RBC 5.61 11/26/2022 1350   HGB 16.3 11/26/2022 1350   HCT 49.6 11/26/2022 1350   PLT 236 11/26/2022 1350   MCV 88.4 11/26/2022 1350   MCH 29.1 11/26/2022 1350   MCHC 32.9 11/26/2022 1350   RDW 13.2 11/26/2022 1350   LYMPHSABS 1.6 11/26/2022 1350   MONOABS 0.5 11/26/2022 1350   EOSABS 0.2 11/26/2022 1350   BASOSABS 0.1 11/26/2022 1350    Lab Results  Component Value Date   HGBA1C >15.0 09/27/2023       Assessment & Plan Right hemiparesis Suspected Stroke Reported right-sided weakness, CT scan pending to confirm or rule out stroke. - Check status  of the CT scan order for the head.  Type II Diabetes Mellitus Blood glucose fluctuates between 160-190 mg/dL, postprandial 754 mg/dL. Emphasized monitoring fasting glucose with target 90-120 mg/dL. - Monitor fasting blood glucose levels in the morning. - Maintain current medication regimen of insulin  glargine, metformin , and empagliflozin . - Re-evaluate blood glucose control in three months with blood work.  Screening for colon cancer FIT test ordered General Health Maintenance Due for pneumonia vaccine, consented to receive today. - Administer pneumonia vaccine today.    No orders of the defined types were placed in this encounter.   Follow-up: Return in about 3 months (around 02/16/2024) for Chronic medical conditions.       Corrina Sabin, MD, FAAFP. [facility] [place], [place] [phone]   11/16/2023, 1:59 PM

## 2023-11-16 NOTE — Addendum Note (Signed)
 Addended by: LONITA FLORETTE GAILS on: 11/16/2023 02:12 PM   Modules accepted: Orders

## 2023-11-19 LAB — MICROALBUMIN / CREATININE URINE RATIO
Creatinine, Urine: 175 mg/dL
Microalb/Creat Ratio: 32 mg/g{creat} — ABNORMAL HIGH (ref 0–29)
Microalbumin, Urine: 56.7 ug/mL

## 2023-11-21 ENCOUNTER — Ambulatory Visit: Payer: Self-pay | Admitting: Family Medicine

## 2023-11-23 LAB — FECAL OCCULT BLOOD, IMMUNOCHEMICAL: Fecal Occult Bld: NEGATIVE

## 2024-01-06 ENCOUNTER — Telehealth: Payer: Self-pay

## 2024-01-06 NOTE — Telephone Encounter (Signed)
 Spoke with patient . Patient given lab results . Interpreter # [phone]

## 2024-02-15 ENCOUNTER — Telehealth: Payer: Self-pay | Admitting: Family Medicine

## 2024-02-15 NOTE — Telephone Encounter (Signed)
 Called pt, pt will be present at appt.

## 2024-02-17 ENCOUNTER — Telehealth: Payer: Self-pay | Admitting: Family Medicine

## 2024-02-17 NOTE — Telephone Encounter (Signed)
 1st attempt lvm to resch appt pcp not in the office

## 2024-02-20 ENCOUNTER — Ambulatory Visit: Admitting: Family Medicine

## 2024-03-09 ENCOUNTER — Other Ambulatory Visit: Payer: Self-pay

## 2024-03-12 ENCOUNTER — Other Ambulatory Visit: Payer: Self-pay

## 2024-04-03 ENCOUNTER — Encounter: Payer: Self-pay | Admitting: Family Medicine

## 2024-04-03 ENCOUNTER — Ambulatory Visit: Attending: Family Medicine | Admitting: Family Medicine

## 2024-04-03 ENCOUNTER — Other Ambulatory Visit: Payer: Self-pay

## 2024-04-03 VITALS — BP 150/81 | HR 75 | Temp 98.6°F | Ht 63.0 in | Wt 152.4 lb

## 2024-04-03 DIAGNOSIS — Z23 Encounter for immunization: Secondary | ICD-10-CM | POA: Diagnosis not present

## 2024-04-03 DIAGNOSIS — M47816 Spondylosis without myelopathy or radiculopathy, lumbar region: Secondary | ICD-10-CM

## 2024-04-03 DIAGNOSIS — M62838 Other muscle spasm: Secondary | ICD-10-CM

## 2024-04-03 DIAGNOSIS — M47812 Spondylosis without myelopathy or radiculopathy, cervical region: Secondary | ICD-10-CM | POA: Diagnosis not present

## 2024-04-03 DIAGNOSIS — Z794 Long term (current) use of insulin: Secondary | ICD-10-CM

## 2024-04-03 DIAGNOSIS — E1159 Type 2 diabetes mellitus with other circulatory complications: Secondary | ICD-10-CM

## 2024-04-03 DIAGNOSIS — E1165 Type 2 diabetes mellitus with hyperglycemia: Secondary | ICD-10-CM | POA: Diagnosis not present

## 2024-04-03 DIAGNOSIS — Z7984 Long term (current) use of oral hypoglycemic drugs: Secondary | ICD-10-CM

## 2024-04-03 DIAGNOSIS — I152 Hypertension secondary to endocrine disorders: Secondary | ICD-10-CM

## 2024-04-03 DIAGNOSIS — R809 Proteinuria, unspecified: Secondary | ICD-10-CM

## 2024-04-03 LAB — POCT GLYCOSYLATED HEMOGLOBIN (HGB A1C): HbA1c, POC (controlled diabetic range): 12.5 % — AB (ref 0.0–7.0)

## 2024-04-03 MED ORDER — AMLODIPINE BESYLATE 10 MG PO TABS
5.0000 mg | ORAL_TABLET | Freq: Every day | ORAL | 1 refills | Status: AC
Start: 2024-04-03 — End: ?
  Filled 2024-04-03: qty 45, 90d supply, fill #0

## 2024-04-03 MED ORDER — EMPAGLIFLOZIN 10 MG PO TABS
10.0000 mg | ORAL_TABLET | Freq: Every day | ORAL | 1 refills | Status: AC
Start: 2024-04-03 — End: ?
  Filled 2024-04-03: qty 90, 90d supply, fill #0

## 2024-04-03 MED ORDER — METFORMIN HCL 500 MG PO TABS
1000.0000 mg | ORAL_TABLET | Freq: Two times a day (BID) | ORAL | 1 refills | Status: AC
Start: 2024-04-03 — End: ?
  Filled 2024-04-03: qty 360, 90d supply, fill #0

## 2024-04-03 MED ORDER — BASAGLAR KWIKPEN 100 UNIT/ML ~~LOC~~ SOPN
50.0000 [IU] | PEN_INJECTOR | Freq: Every day | SUBCUTANEOUS | 6 refills | Status: AC
  Filled 2024-04-03: qty 30, 60d supply, fill #0
  Filled 2024-06-04: qty 30, 60d supply, fill #1

## 2024-04-03 MED ORDER — ATORVASTATIN CALCIUM 20 MG PO TABS
20.0000 mg | ORAL_TABLET | Freq: Every day | ORAL | 1 refills | Status: AC
Start: 2024-04-03 — End: ?
  Filled 2024-04-03: qty 90, 90d supply, fill #0

## 2024-04-03 NOTE — Patient Instructions (Signed)
 Diabetes y plan de accin Diabetes Action Plan Un plan de accin para la diabetes es una forma de controlar sus sntomas de diabetes, tambin llamada diabetes mellitus. El plan se codifica con colores para servir como gua sobre qu acciones tomar en funcin de los sntomas que est teniendo. Si tiene sntomas pertenecientes a la zona roja, necesita buscar atencin mdica inmediatamente. Si tiene sntomas pertenecientes a la zona Health visitor, su diabetes no est controlada y usted Engineer, building services. Si tiene sntomas pertenecientes a la zona verde, significa que se Materials engineer. Comprender la diabetes puede llevar tiempo. Siga el plan de tratamiento que elabor junto con el mdico. Sepa cul es el rango objetivo de azcar en la sangre, tambin llamada glucosa, para usted. Revise el plan con el mdico en todas las consultas. El rango deseado para mi nivel de azcar en la sangre es __________________________ mg/dl. Zona roja Obtenga ayuda de inmediato si observa cualquiera de estos sntomas: Un resultado de azcar en la sangre que est por debajo de 54 mg/dl (3 mmol/l). Un nivel de azcar en la sangre mayor o igual que 240 mg/dl (47.8 mmol/l) durante 2 das seguidos acompaado de: Sed extrema y Geographical information systems officer con frecuencia. Confusin o dificultad para pensar con claridad. Niveles moderados o altos de cetonas en la [place]. Sentirse cansado o sin energa. Dificultad para respirar. Malestar o fiebre durante 2 o ms [employer] no mejora. Estos sntomas pueden Customer service manager. Llame al 911 de inmediato. No espere a ver si los sntomas desaparecen. No conduzca por sus propios medios [employer]. Si tiene un nivel de azcar en la sangre muy bajo, lo que tambin se llama hipoglucemia grave, y no puede ingerir ningn alimento ni bebida, tal vez necesite glucagn. Asegrese de que un familiar o amigo sepa controlarle el nivel de azcar en la sangre y aplicarle glucagn. Puede necesitar  tratamiento en un hospital para esta afeccin. Zona amarilla Si tiene alguno de los siguientes sntomas, su diabetes no est controlada y usted Economist algunos cambios: Un nivel de azcar en la sangre mayor o igual que 240 mg/dl (29.5 mmol/l) durante 2 das seguidos. Un resultado de azcar en la sangre que est por debajo de 70 mg/dl (3.9 mmol/l). Otros sntomas de hipoglucemia, como: Temblores o sensacin de desvanecimiento. Confusin o irritabilidad. Sensacin de [place]. Latidos cardacos acelerados. Si tiene algn sntoma de la zona amarilla: Trate la hipoglucemia comiendo o bebiendo 15 gramos de hidratos de carbono de accin rpida. Siga las regla 15:15: Consuma 15 gramos de hidratos de carbono de accin rpida, como: 1 pomo de glucosa en gel. 4 comprimidos de glucosa. 4 onzas (120 ml) de jugo de frutas. 4 onzas (120 ml) de refresco comn (no diettico). Vuelva a controlar su nivel de azcar en la sangre 15 minutos despus de ingerir el hidrato de carbono. Si este segundo nivel de azcar en la sangre todava es igual o menor que 70 mg/dl (3.9 mmol/l), ingiera nuevamente 15 gramos de un hidrato de carbono. Si el nivel de azcar en la sangre no supera los 70 mg/dl (3.9 mmol/l) despus de 3 intentos, solicite ayuda mdica de inmediato. Ingiera una comida o un refrigerio en el transcurso de 1 hora despus de que el nivel de azcar en la sangre se haya normalizado. Siga tomando los medicamentos diarios como se lo haya indicado el mdico. Controle su nivel de azcar en la sangre con ms frecuencia que lo hara normalmente. [employer]. Llame al mdico si tiene problemas  para Futures trader de azcar en la sangre dentro del rango deseado. Zona verde Estos signos significan que se encuentra bien y puede continuar haciendo lo que est haciendo para Chief Operating Officer la diabetes: Su nivel de azcar en la sangre est en el rango deseado. En la [employer], el nivel de  International aid/development worker en la sangre antes de una comida debera ser de 80 a 130 mg/dl (de 4.4 a 7.2 mmol/l). Se siente bien y Copy a Education officer, environmental las actividades diarias. Si se encuentra en la zona verde, contine controlando su diabetes como se lo haya indicado el mdico. Para hacer esto: Siga una dieta saludable. Haga ejercicio regularmente. Contrlese el nivel de azcar en la sangre como se lo hayan indicado. Use los medicamentos nicamente segn las indicaciones. Dnde obtener ms informacin American Diabetes Association (ADA) (Asociacin Estadounidense de la Diabetes): diabetes.org Association of Diabetes Care & Education Specialists (ADCES) (Asociacin de Especialistas en Atencin y Educacin sobre la Diabetes): adces.org/diabetes-education-dsmes Esta informacin no tiene Theme park manager el consejo del mdico. Asegrese de hacerle al mdico cualquier pregunta que tenga. Document Revised: 01/20/2023 Document Reviewed: 01/20/2023 Elsevier Patient Education  2024 [employer].
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
## 2024-04-04 ENCOUNTER — Ambulatory Visit: Payer: Self-pay | Admitting: Family Medicine

## 2024-04-04 LAB — CMP14+EGFR
ALT: 36 IU/L (ref 0–44)
AST: 24 IU/L (ref 0–40)
Albumin: 4.6 g/dL (ref 3.8–4.9)
Alkaline Phosphatase: 101 IU/L (ref 47–123)
BUN/Creatinine Ratio: 21 — ABNORMAL HIGH (ref 9–20)
BUN: 15 mg/dL (ref 6–24)
Bilirubin Total: 0.3 mg/dL (ref 0.0–1.2)
CO2: 24 mmol/L (ref 20–29)
Calcium: 10.3 mg/dL — ABNORMAL HIGH (ref 8.7–10.2)
Chloride: 99 mmol/L (ref 96–106)
Creatinine, Ser: 0.7 mg/dL — ABNORMAL LOW (ref 0.76–1.27)
Globulin, Total: 3.2 g/dL (ref 1.5–4.5)
Glucose: 208 mg/dL — ABNORMAL HIGH (ref 70–99)
Potassium: 4.1 mmol/L (ref 3.5–5.2)
Sodium: 141 mmol/L (ref 134–144)
Total Protein: 7.8 g/dL (ref 6.0–8.5)
eGFR: 112 mL/min/1.73 (ref >59)

## 2024-05-04 ENCOUNTER — Encounter: Payer: Self-pay | Admitting: Pharmacist

## 2024-05-04 ENCOUNTER — Ambulatory Visit: Attending: Family Medicine | Admitting: Pharmacist

## 2024-05-04 DIAGNOSIS — Z7984 Long term (current) use of oral hypoglycemic drugs: Secondary | ICD-10-CM

## 2024-05-04 DIAGNOSIS — E1165 Type 2 diabetes mellitus with hyperglycemia: Secondary | ICD-10-CM | POA: Diagnosis not present

## 2024-05-04 DIAGNOSIS — Z794 Long term (current) use of insulin: Secondary | ICD-10-CM | POA: Diagnosis not present

## 2024-05-04 NOTE — Progress Notes (Signed)
 "   S:     No chief complaint on file.  53 y.o. male who presents for diabetes evaluation, education, and management. Patient arrives in good spirits and presents without any assistance.   Patient was referred and last seen by Primary Care Provider, Dr. Delbert, on 04/03/24. At that visit, his A1c was 12.5, down from 15% prior. Of note, pt admitted to missing Jardiance . He also admitted to taking Lantus  inconsistently due to injection site pain and work-related obligations. He was referred to me for injection technique.    PMH is significant for hypertension, type 2 diabetes mellitus , history of septic arthritis of the sternoclavicular joint, liver abscess in 10/2021 status post incision and drainage, treatment with antibiotics.  Patient reports Diabetes is longstanding. Has been using Basaglar  daily since visit with Dr. Newlin. Despite admitting to missing doses at that visit he denies any missed doses recently. Additionally, he denies any injection site pain. He has been taking Jardiance  as prescribed. Even though his metformin  is prescribed as two 500 mg tablets BID he is only taking one tablet (500mg ) BID.   Family/Social History:  -FHX: HTN, DM -Tobacco: never smoker  -Alcohol: none reported   Current diabetes medications include: Basaglar  50 units daily, metformin  500 tablets - supposed to take 2 tablets (1000mg ) BID but only takes 1 tablet BID, Jardiance  10 mg daily   Insurance coverage: [employer]  Patient denies hypoglycemic events. Reported home fasting blood sugars: not checking every day. But when he has checked since last PCP visit he gets  160s-170s  Reported 2 hour post-meal/random blood sugars: 200s. Highest given is 260 since last PCP visit.   Patient denies nocturia (nighttime urination).  Patient denies neuropathy (nerve pain). Patient denies visual changes. Patient reports self foot exams.   Patient reported dietary habits: - None given  Patient-reported exercise  habits:  - None outside of activity at work   O:  No GM or CGM with him today.   Lab Results  Component Value Date   HGBA1C 12.5 (A) 04/03/2024   There were no vitals filed for this visit.  Lipid Panel     Component Value Date/Time   CHOL 160 11/25/2021 1435   TRIG 327 (H) 11/25/2021 1435   HDL 32 (L) 11/25/2021 1435   CHOLHDL 5.0 11/25/2021 1435   CHOLHDL 5.0 03/09/2008 0000   VLDL 48 03/09/2008 0000   LDLCALC 75 11/25/2021 1435    Clinical Atherosclerotic Cardiovascular Disease (ASCVD): No  The 10-year ASCVD risk score (Arnett DK, et al., 2019) is: 12.8%   Values used to calculate the score:     Age: 78 years     Clinically relevant sex: Male     Is Non-Hispanic African American: No     Diabetic: Yes     Tobacco smoker: No     Systolic Blood Pressure: 150 mmHg     Is BP treated: Yes     HDL Cholesterol: 32 mg/dL     Total Cholesterol: 160 mg/dL   Patient is participating in a Managed Medicaid Plan: no   A/P: Diabetes longstanding currently uncontrolled but seems to be improving. Since last visit with his PCP his medication adherence appears to be improving. I will have him increase to metformin  1000 mg (taken as two 500 mg tablets) BID as prescribed. Patient is not symptomatic at this time but is able to verbalize appropriate hypoglycemia management plan. I do not have much to go off of at home. Before making changes  to his insulin  dose, I will have try and record some readings and return in 1 month. He is amenable to this.  -Continued Basaglar  50 units daily.  -Continued Jardiance  10 mg daily.  -Increase metformin  to 1000 mg BID (taken as two 500 mg tablets BID). -Patient educated on purpose, proper use, and potential adverse effects of metformin , insulin  glargine, and Jardiance .  -Extensively discussed pathophysiology of diabetes, recommended lifestyle interventions, dietary effects on blood sugar control.  -Counseled on s/sx of and management of hypoglycemia.   -Next A1c anticipated 06/2024.   Written patient instructions provided. Patient verbalized understanding of treatment plan.  Total time in face to face counseling 30 minutes.    Follow-up:  Pharmacist in 1 month PCP clinic visit 07/04/24.  Herlene Fleeta Morris, PharmD, JAQUELINE, CPP Clinical Pharmacist [facility] [phone]   "

## 2024-06-04 ENCOUNTER — Encounter: Payer: Self-pay | Admitting: Pharmacist

## 2024-06-04 ENCOUNTER — Other Ambulatory Visit: Payer: Self-pay

## 2024-06-04 ENCOUNTER — Ambulatory Visit: Payer: Self-pay | Admitting: Pharmacist

## 2024-06-04 DIAGNOSIS — Z7984 Long term (current) use of oral hypoglycemic drugs: Secondary | ICD-10-CM

## 2024-06-04 DIAGNOSIS — E1165 Type 2 diabetes mellitus with hyperglycemia: Secondary | ICD-10-CM

## 2024-06-04 DIAGNOSIS — Z794 Long term (current) use of insulin: Secondary | ICD-10-CM | POA: Diagnosis not present

## 2024-06-04 MED ORDER — INSULIN PEN NEEDLE 32G X 4 MM MISC
2 refills | Status: AC
  Filled 2024-06-04: qty 100, 30d supply, fill #0
  Filled 2024-06-04: qty 100, 90d supply, fill #0

## 2024-06-04 NOTE — Progress Notes (Signed)
 "   S:     No chief complaint on file.  54 y.o. male who presents for diabetes evaluation, education, and management. Patient arrives in good spirits and presents without any assistance.   Patient was referred and last seen by Primary Care Provider, Dr. Delbert, on 04/03/24. At that visit, his A1c was 12.5, down from 15% prior.   I saw him on 05/04/24. At that visit with me, he reported improved insulin  adherence. He denied any missed doses since after he saw Dr. Newlin on 04/03/2024. I had him increase his metformin  to 1000 mg BID as prescribed.   PMH is significant for hypertension, type 2 diabetes mellitus , history of septic arthritis of the sternoclavicular joint, liver abscess in 10/2021 status post incision and drainage, treatment with antibiotics.  Today, patient is in good spirits. Since his last visit with me, he has been using Basaglar  daily as prescribed. Tells me he is injecting 50 units daily. He has been taking Jardiance  as prescribed. Lastly, he has been taking metformin  as two tablets BID as instructed at his last visit with me. He has no complaints today.  Family/Social History:  -FHX: HTN, DM -Tobacco: never smoker  -Alcohol: none reported   Current diabetes medications include: Basaglar  50 units daily, metformin  500 tablets - 2 tablets (1000mg ) BID, Jardiance  10 mg daily   Insurance coverage: [employer]  Patient reports 1 hypoglycemic event since last visit. Endorses symptoms of dizziness, blurry vision - reading was 68 mg/dL. Treated successfully.  Reported home fasting blood sugars: 125, 119, 96, 134, 88, 96, 110, 105, 154, 116, 141, 119, 89, 87, 137,121, 112, 170, 134, 123, 111,120, 135,92, 145, 150, 68, 128, 113, 85 30-day fasting avg since: 117 mg/dL  Patient denies nocturia (nighttime urination).  Patient denies neuropathy (nerve pain). Patient denies visual changes. Patient reports self foot exams.   Patient reported dietary habits: - None given   Patient-reported exercise habits:  - None outside of activity at work   O:  No GM or CGM with him today.   Lab Results  Component Value Date   HGBA1C 12.5 (A) 04/03/2024   There were no vitals filed for this visit.  Lipid Panel     Component Value Date/Time   CHOL 160 11/25/2021 1435   TRIG 327 (H) 11/25/2021 1435   HDL 32 (L) 11/25/2021 1435   CHOLHDL 5.0 11/25/2021 1435   CHOLHDL 5.0 03/09/2008 0000   VLDL 48 03/09/2008 0000   LDLCALC 75 11/25/2021 1435    Clinical Atherosclerotic Cardiovascular Disease (ASCVD): No  The 10-year ASCVD risk score (Arnett DK, et al., 2019) is: 12.8%   Values used to calculate the score:     Age: 86 years     Clinically relevant sex: Male     Is Non-Hispanic African American: No     Diabetic: Yes     Tobacco smoker: No     Systolic Blood Pressure: 150 mmHg     Is BP treated: Yes     HDL Cholesterol: 32 mg/dL     Total Cholesterol: 160 mg/dL   Patient is participating in a Managed Medicaid Plan: no   A/P: Diabetes longstanding currently uncontrolled but seems to be improving. Since last visit with me, his home fasting sugar avg is 117 mg/dL based on his home log. He is now fully adherent to his medications. Patient is not symptomatic at this time but is able to verbalize appropriate hypoglycemia management plan.  -Continued Basaglar  50 units daily.  -  Continued Jardiance  10 mg daily.  -Continued metformin  1000 mg BID. -Patient educated on purpose, proper use, and potential adverse effects of metformin , insulin  glargine, and Jardiance .  -Extensively discussed pathophysiology of diabetes, recommended lifestyle interventions, dietary effects on blood sugar control.  -Counseled on s/sx of and management of hypoglycemia.  -Next A1c anticipated 06/2024.   Written patient instructions provided. Patient verbalized understanding of treatment plan.  Total time in face to face counseling 30 minutes.    Follow-up:  Pharmacist in 2 months. PCP  clinic visit 07/04/24.  Herlene Fleeta Morris, PharmD, JAQUELINE, CPP Clinical Pharmacist [facility] [phone]   "

## 2024-06-05 ENCOUNTER — Other Ambulatory Visit: Payer: Self-pay

## 2024-06-06 ENCOUNTER — Other Ambulatory Visit: Payer: Self-pay

## 2024-06-12 ENCOUNTER — Other Ambulatory Visit: Payer: Self-pay

## 2024-06-13 ENCOUNTER — Other Ambulatory Visit: Payer: Self-pay

## 2024-07-04 ENCOUNTER — Ambulatory Visit: Payer: Self-pay | Admitting: Family Medicine

## 2024-08-03 ENCOUNTER — Ambulatory Visit: Payer: Self-pay | Admitting: Pharmacist
